# Patient Record
Sex: Female | Born: 1975 | Race: Black or African American | Hispanic: No | Marital: Single | Smoking: Current some day smoker
Health system: Southern US, Community
[De-identification: ages and names within clinical notes are randomized; demographics above are authoritative.]

## PROBLEM LIST (undated history)

## (undated) DIAGNOSIS — T8859XA Other complications of anesthesia, initial encounter: Secondary | ICD-10-CM

## (undated) DIAGNOSIS — D649 Anemia, unspecified: Secondary | ICD-10-CM

## (undated) DIAGNOSIS — R112 Nausea with vomiting, unspecified: Secondary | ICD-10-CM

## (undated) DIAGNOSIS — G43909 Migraine, unspecified, not intractable, without status migrainosus: Secondary | ICD-10-CM

## (undated) DIAGNOSIS — F329 Major depressive disorder, single episode, unspecified: Secondary | ICD-10-CM

## (undated) DIAGNOSIS — J45998 Other asthma: Secondary | ICD-10-CM

## (undated) DIAGNOSIS — Z9889 Other specified postprocedural states: Secondary | ICD-10-CM

## (undated) DIAGNOSIS — F32A Depression, unspecified: Secondary | ICD-10-CM

## (undated) DIAGNOSIS — J42 Unspecified chronic bronchitis: Secondary | ICD-10-CM

## (undated) DIAGNOSIS — G4730 Sleep apnea, unspecified: Secondary | ICD-10-CM

## (undated) DIAGNOSIS — T4145XA Adverse effect of unspecified anesthetic, initial encounter: Secondary | ICD-10-CM

## (undated) DIAGNOSIS — C50912 Malignant neoplasm of unspecified site of left female breast: Secondary | ICD-10-CM

## (undated) DIAGNOSIS — M1990 Unspecified osteoarthritis, unspecified site: Secondary | ICD-10-CM

## (undated) DIAGNOSIS — C50911 Malignant neoplasm of unspecified site of right female breast: Secondary | ICD-10-CM

## (undated) DIAGNOSIS — F419 Anxiety disorder, unspecified: Secondary | ICD-10-CM

## (undated) HISTORY — PX: ABDOMINAL HYSTERECTOMY: SHX81

## (undated) HISTORY — PX: TONSILLECTOMY: SUR1361

## (undated) HISTORY — PX: BREAST BIOPSY: SHX20

## (undated) HISTORY — PX: FRACTURE SURGERY: SHX138

## (undated) HISTORY — PX: TUBAL LIGATION: SHX77

## (undated) HISTORY — PX: MASTECTOMY: SHX3

---

## 2004-10-15 ENCOUNTER — Ambulatory Visit (HOSPITAL_COMMUNITY): Admission: RE | Admit: 2004-10-15 | Discharge: 2004-10-15 | Payer: Self-pay | Admitting: Cardiology

## 2004-12-11 ENCOUNTER — Ambulatory Visit (HOSPITAL_BASED_OUTPATIENT_CLINIC_OR_DEPARTMENT_OTHER): Admission: RE | Admit: 2004-12-11 | Discharge: 2004-12-11 | Payer: Self-pay | Admitting: Cardiology

## 2004-12-11 ENCOUNTER — Ambulatory Visit: Payer: Self-pay | Admitting: Internal Medicine

## 2005-03-21 ENCOUNTER — Encounter: Payer: Self-pay | Admitting: Pulmonary Disease

## 2005-06-19 ENCOUNTER — Other Ambulatory Visit: Admission: RE | Admit: 2005-06-19 | Discharge: 2005-06-19 | Payer: Self-pay | Admitting: Obstetrics and Gynecology

## 2006-01-16 ENCOUNTER — Emergency Department (HOSPITAL_COMMUNITY): Admission: EM | Admit: 2006-01-16 | Discharge: 2006-01-16 | Payer: Self-pay | Admitting: Emergency Medicine

## 2007-03-13 ENCOUNTER — Ambulatory Visit (HOSPITAL_COMMUNITY): Admission: RE | Admit: 2007-03-13 | Discharge: 2007-03-13 | Payer: Self-pay | Admitting: Obstetrics and Gynecology

## 2007-03-13 HISTORY — PX: NOVASURE ABLATION: SHX5394

## 2007-12-09 ENCOUNTER — Encounter: Admission: RE | Admit: 2007-12-09 | Discharge: 2007-12-09 | Payer: Self-pay | Admitting: Cardiology

## 2008-11-28 ENCOUNTER — Encounter: Admission: RE | Admit: 2008-11-28 | Discharge: 2008-11-28 | Payer: Self-pay | Admitting: Obstetrics and Gynecology

## 2009-07-20 ENCOUNTER — Ambulatory Visit: Payer: Self-pay | Admitting: Pulmonary Disease

## 2009-07-20 DIAGNOSIS — G4733 Obstructive sleep apnea (adult) (pediatric): Secondary | ICD-10-CM

## 2009-07-20 DIAGNOSIS — J309 Allergic rhinitis, unspecified: Secondary | ICD-10-CM | POA: Insufficient documentation

## 2009-07-20 DIAGNOSIS — R0602 Shortness of breath: Secondary | ICD-10-CM

## 2009-07-20 DIAGNOSIS — J45909 Unspecified asthma, uncomplicated: Secondary | ICD-10-CM | POA: Insufficient documentation

## 2010-01-12 ENCOUNTER — Encounter: Admission: RE | Admit: 2010-01-12 | Discharge: 2010-01-12 | Payer: Self-pay | Admitting: Obstetrics and Gynecology

## 2010-12-02 ENCOUNTER — Encounter: Payer: Self-pay | Admitting: Cardiology

## 2010-12-19 ENCOUNTER — Other Ambulatory Visit: Payer: Self-pay | Admitting: Obstetrics and Gynecology

## 2010-12-19 DIAGNOSIS — N63 Unspecified lump in unspecified breast: Secondary | ICD-10-CM

## 2011-01-11 ENCOUNTER — Other Ambulatory Visit: Payer: Self-pay | Admitting: Obstetrics and Gynecology

## 2011-01-11 DIAGNOSIS — Z1231 Encounter for screening mammogram for malignant neoplasm of breast: Secondary | ICD-10-CM

## 2011-01-24 ENCOUNTER — Ambulatory Visit
Admission: RE | Admit: 2011-01-24 | Discharge: 2011-01-24 | Disposition: A | Discharge status: Home / Self Care | Payer: Medicare (Managed Care) | Source: Ambulatory Visit | Attending: Obstetrics and Gynecology | Admitting: Obstetrics and Gynecology

## 2011-01-24 ENCOUNTER — Other Ambulatory Visit: Payer: Self-pay | Admitting: Obstetrics and Gynecology

## 2011-01-24 DIAGNOSIS — Z1231 Encounter for screening mammogram for malignant neoplasm of breast: Secondary | ICD-10-CM

## 2011-01-24 DIAGNOSIS — N6452 Nipple discharge: Secondary | ICD-10-CM

## 2011-01-31 ENCOUNTER — Ambulatory Visit
Admission: RE | Admit: 2011-01-31 | Discharge: 2011-01-31 | Disposition: A | Discharge status: Home / Self Care | Payer: Medicare (Managed Care) | Source: Ambulatory Visit | Attending: Obstetrics and Gynecology | Admitting: Obstetrics and Gynecology

## 2011-01-31 DIAGNOSIS — N6452 Nipple discharge: Secondary | ICD-10-CM

## 2011-03-29 NOTE — H&P (Signed)
NAMEJANELLY, [name]              ACCOUNT NO.:  [id number]   MEDICAL RECORD NO.:  [id number]          PATIENT TYPE:  AMB   LOCATION:  SDC                           FACILITY:  [facility]   PHYSICIAN:  Sherron Monday, MD        DATE OF BIRTH:  [birth year]   DATE OF ADMISSION:  03/13/2007  DATE OF DISCHARGE:                              HISTORY & PHYSICAL   ADMITTING DIAGNOSIS:  Menorrhagia, for NovaSure endometrial ablation.   HISTORY OF PRESENT ILLNESS:  A 35 year old G1, P1, who has menorrhagia  and declines oral contraceptive pills for management and desires  NovaSure ablation.  An endometrial biopsy was performed, which was  negative, and then a sonohystogram revealed no polyps.   PAST MEDICAL HISTORY:  Significant for asthma and allergies.   PAST SURGICAL HISTORY:  Significant for bilateral tubal ligation, as  well as a breast lumpectomy.   PAST OB-GYN HISTORY:  She is a G1, P1, with G1 being a term delivery of  a 6-pound, 5-ounce infant female who is 13 years old.  She has a history  of an abnormal Pap smear.  They have been normal, since her last Pap was  performed on the 30th of January and was within normal limits.  She has  a history of Chlamydia as a sexually transmitted disease, treated and  the test of cure was negative.  She states that her periods are mostly  regular; however, sometimes she skips and they are very heavy.  She has  a tubal ligation for contraception.  She is sexually active and  monogamous and has no dyspareunia; occasionally spots other times in her  cycle but no postcoital bleeding.   MEDICATIONS:  1. Trazodone.  2. Albuterol.  3. Nasonex.  4. Astelin.   ALLERGIES:  NO KNOWN DRUG ALLERGIES.   SOCIAL HISTORY:  She smokes approximately a pack a day.  No alcohol or  drug use.  She is single and a homemaker.   FAMILY HISTORY:  Significant for an aunt with breast cancer and  widespread diabetes on her paternal side.   PHYSICAL EXAMINATION:  VITAL SIGNS:   Afebrile, vital signs stable.  GENERAL:  No apparent distress.  CARDIOVASCULAR:  Regular rate and rhythm.  LUNGS:  Clear to auscultation bilaterally.  ABDOMEN:  Soft, nontender, nondistended and positive bowel sounds.  NECK:  Without lymphadenopathy.  Thyroid within normal limits.  HEENT:  Mucous membranes are moist.  BREAST:  C cup, no masses noted, nontender, no distortion.  PELVIC:  Reveals normal external female genitalia, normal BUS, good  support.  Cervix and vagina without lesion.  No cervical motion  tenderness.  Uterus within normal limits.  Bilateral adnexa.  No masses  and nontender.   ASSESSMENT/PLAN:  A 35 year old African-American G1, P1 with menorrhagia  who desires definitive treatment and has opted for a NovaSure  endometrial ablation.  She will present to the hospital and undergo the  ablation on the second of May.      Sherron Monday, MD  Electronically Signed     JB/MEDQ  D:  03/12/2007  T:  03/12/2007  Job:  [id number]

## 2011-03-29 NOTE — Procedures (Signed)
NAME:  [name], [name]              ACCOUNT NO.:  [id number]   MEDICAL RECORD NO.:  [id number]          PATIENT TYPE:  OUT   LOCATION:  SLEEP CENTER                 FACILITY:  [facility]   PHYSICIAN:  Clinton D. Maple Hudson, M.D. DATE OF BIRTH:  1976-05-14   DATE OF STUDY:  12/11/2004                              NOCTURNAL POLYSOMNOGRAM   REFERRING PHYSICIAN:  Donia Guiles, MD   INDICATIONS FOR STUDY:  Insomnia with sleep apnea. Epworth sleepiness score  11/24, BMI 24, weight 152 pounds.   SLEEP ARCHITECTURE:  Total sleep time 379 minutes with sleep efficiency of  82%. Stage I was 5%, stage II was 43%, stages III and IV were 16%, REM was  6% of total sleep time. Latency to sleep onset 66 minutes. Latency to REM 65  minutes. Awake after sleep onset 16 minutes. Arousal index 11. She took  trazodone at lights out.   RESPIRATORY DATA:  RDI of 2.5 per hour which is within normal limits. This  included 4 obstructive apneas, 3 central apneas, 1 mixed apnea, and 8  hypopneas. Events were not positional. REM RDI was 6.1   OXYGEN DATA:  Mild to moderate snoring with oxygen desaturation briefly to a  nadir of 87%.  Mean oxygen saturation was 97% to 98% on room air through the  study.   CARDIAC DATA:  Normal sinus rhythm.   MOVEMENT/PARASOMNIA:  A total of 97 limb jerks were recorded of 12 were  associated with arousal or awakening for a periodic limb movement with  arousal index of 1.9 per hour which is unremarkable. Bathroom times 1.   IMPRESSION/RECOMMENDATIONS:  1.  Occasional sleep disordered breathing events with a frequency (RDI) of      2.5 per hour, which is within normal limits, and diagnostic of a sleep      apnea syndrome. CPAP trial is indicated.  2.  Relatively long latency to sleep onset with sleep architecture      subsequently remarkable mainly for increased REM. This may reflect      rebound if REM has been suppressed on previous nights. She took      trazodone to assist with  sleep.  3.  Suggest managing primary complaint as insomnia with attention to good      sleep hygiene.     CDY/MEDQ  D:  12/16/2004 09:51:30  T:  12/16/2004 21:02:15  Job:  [id number]

## 2011-03-29 NOTE — Op Note (Signed)
NAMEJAKITA, [name]              ACCOUNT NO.:  [id number]   MEDICAL RECORD NO.:  [id number]          PATIENT TYPE:  AMB   LOCATION:  SDC                           FACILITY:  [facility]   PHYSICIAN:  Sherron Monday, MD        DATE OF BIRTH:  1976/03/31   DATE OF PROCEDURE:  03/13/2007  DATE OF DISCHARGE:                               OPERATIVE REPORT   PREOPERATIVE DIAGNOSIS:  Menorrhagia.   POSTOPERATIVE DIAGNOSIS:  Menorrhagia.   OPERATION PERFORMED:  Novasure endometrial ablation.   SURGEON:  Sherron Monday, MD   ANESTHESIA:  General, local.   SPECIMENS:  None.   ESTIMATED BLOOD LOSS:  Minimal.   IV FLUIDS:  600 mL.   URINE OUTPUT:  In and out cath at start of procedure.   COMPLICATIONS:  None.   FINDINGS:  Uterus sounded to 8.5 cm, cervical length 3 cm, cavity length  of 5.5 cm, cavity width measured at 3 cm, power at 91 W, time 70  seconds.   DISPOSITION:  To post anesthesia care unit in stable condition.   DESCRIPTION OF PROCEDURE:  After informed consent was reviewed with the  patient including risks, benefits, alternatives to surgical procedure,  she was transported in stable condition to the operating room, placed on  the table in supine position.  General anesthesia was induced, found to  be adequate.  She was then placed in yellow fin stirrups, prepped and  draped in the normal sterile fashion.  Her bladder was drained with a  red rubber catheter.  Using an open sided speculum, her cervix was  easily visualized, grasped with a single toothed tenaculum and 10 mL of  local anesthetic was administered without difficulty.  The uterus was  sounded to 8.5 cm, was then dilated to accommodate the Novasure  endometrial ablation device.  The cervical length was found to be 3 cm.  After placement of the device, the cavity was calculated to be 3 cm.  A  cavity assessment was performed, found to be intact.  The ablation was  then performed for approximately 70 seconds with a power  of 91 W.  The  patient tolerated the procedure well.  The device was then removed.  The  single toothed tenaculum was removed.  Hemostasis was  assured.  The speculum was removed.  The patient was returned to supine  position.  She tolerated the procedure well.  Sponge, lap, and needle  counts were correct x2 at the end of the procedure.  The patient was  taken in stable condition to PACU.      Sherron Monday, MD  Electronically Signed     JB/MEDQ  D:  03/13/2007  T:  03/13/2007  Job:  [id number]

## 2011-06-14 ENCOUNTER — Other Ambulatory Visit: Payer: Self-pay | Admitting: Obstetrics and Gynecology

## 2011-06-14 DIAGNOSIS — N63 Unspecified lump in unspecified breast: Secondary | ICD-10-CM

## 2011-06-20 ENCOUNTER — Ambulatory Visit
Admission: RE | Admit: 2011-06-20 | Discharge: 2011-06-20 | Disposition: A | Discharge status: Home / Self Care | Payer: Medicare Other | Source: Ambulatory Visit | Attending: Obstetrics and Gynecology | Admitting: Obstetrics and Gynecology

## 2011-06-20 ENCOUNTER — Other Ambulatory Visit: Payer: Self-pay | Admitting: Obstetrics and Gynecology

## 2011-06-20 DIAGNOSIS — N63 Unspecified lump in unspecified breast: Secondary | ICD-10-CM

## 2011-07-23 ENCOUNTER — Emergency Department (HOSPITAL_COMMUNITY)
Admission: EM | Admit: 2011-07-23 | Discharge: 2011-07-24 | Discharge status: Against Medical Advice | Payer: Medicare Other | Attending: Emergency Medicine | Admitting: Emergency Medicine

## 2011-07-23 DIAGNOSIS — R197 Diarrhea, unspecified: Secondary | ICD-10-CM | POA: Insufficient documentation

## 2011-07-23 DIAGNOSIS — R112 Nausea with vomiting, unspecified: Secondary | ICD-10-CM | POA: Insufficient documentation

## 2011-07-23 DIAGNOSIS — R51 Headache: Secondary | ICD-10-CM | POA: Insufficient documentation

## 2011-11-22 ENCOUNTER — Emergency Department (HOSPITAL_COMMUNITY)
Admission: EM | Admit: 2011-11-22 | Discharge: 2011-11-23 | Disposition: A | Discharge status: Home / Self Care | Payer: Self-pay | Attending: Emergency Medicine | Admitting: Emergency Medicine

## 2011-11-22 ENCOUNTER — Encounter (HOSPITAL_COMMUNITY): Payer: Self-pay

## 2011-11-22 DIAGNOSIS — F17200 Nicotine dependence, unspecified, uncomplicated: Secondary | ICD-10-CM | POA: Insufficient documentation

## 2011-11-22 DIAGNOSIS — L0231 Cutaneous abscess of buttock: Secondary | ICD-10-CM | POA: Insufficient documentation

## 2011-11-22 MED ORDER — LIDOCAINE HCL (PF) 1 % IJ SOLN
30.0000 mL | Freq: Once | INTRAMUSCULAR | Status: AC
  Administered 2011-11-23: 30 mL
  Filled 2011-11-22: qty 30

## 2011-11-22 NOTE — ED Notes (Signed)
Pt presents w/ abscess in gluteal crease x 2 months.

## 2011-11-23 MED ORDER — DOXYCYCLINE HYCLATE 100 MG PO CAPS: 100.0000 mg | ORAL_CAPSULE | Freq: Two times a day (BID) | ORAL | Status: AC

## 2011-11-23 MED ORDER — HYDROCODONE-ACETAMINOPHEN 5-325 MG PO TABS: 1.0000 | ORAL_TABLET | ORAL | Status: AC | PRN

## 2011-11-23 NOTE — ED Provider Notes (Signed)
Medical screening examination/treatment/procedure(s) were performed by non-physician practitioner and as supervising physician I was immediately available for consultation/collaboration.  Tru Leopard K Cross Jorge-Rasch, MD 11/23/11 0644 

## 2011-11-23 NOTE — ED Provider Notes (Signed)
History     CSN: [id number]  Arrival date & time 11/22/11  2157   First MD Initiated Contact with Patient 11/22/11 2235      Chief Complaint  Patient presents with  . Abscess    LT buttock.  states the size of 2, 50cent pieces.     (Consider location/radiation/quality/duration/timing/severity/associated sxs/prior treatment) Patient is a 36 y.o. female presenting with abscess. The history is provided by the patient.  Abscess  This is a new problem. The current episode started more than one week ago. The problem occurs continuously. The problem has been gradually worsening. Affected Location: left buttock. The problem is severe. The abscess is characterized by redness, painfulness and swelling. The abscess first occurred at home. Pertinent negatives include no fever, no diarrhea and no vomiting. She has received no recent medical care.    Past Medical History  Diagnosis Date  . Asthma     Past Surgical History  Procedure Date  . Tubal ligation   . Breast lumpectomy     RT side    History reviewed. No pertinent family history.  History  Substance Use Topics  . Smoking status: Current Everyday Smoker -- 0.2 packs/day  . Smokeless tobacco: Not on file  . Alcohol Use: No     Review of Systems  Constitutional: Negative for fever and chills.  Gastrointestinal: Negative for nausea, vomiting, abdominal pain and diarrhea.  Musculoskeletal: Negative for back pain and gait problem.  Skin: Positive for color change.       Positive abscess  Neurological: Negative for weakness, numbness and headaches.    Allergies  Review of patient's allergies indicates no known allergies.  Home Medications   Current Outpatient Rx  Name Route Sig Dispense Refill  . DULOXETINE HCL 30 MG PO CPEP Oral Take 90 mg by mouth daily.    . TRAZODONE HCL 100 MG PO TABS Oral Take 300 mg by mouth at bedtime.      BP 128/87  Pulse 103  Temp(Src) 98.8 F (37.1 C) (Oral)  Resp 16  Ht 5' 6.5"  (1.689 m)  SpO2 100%  LMP 11/11/2011  Physical Exam  Nursing note and vitals reviewed. Constitutional: She is oriented to person, place, and time. She appears well-developed and well-nourished. No distress.  HENT:  Head: Normocephalic and atraumatic.  Right Ear: External ear normal.  Left Ear: External ear normal.  Nose: Nose normal.  Eyes: EOM are normal. Pupils are equal, round, and reactive to light.  Neck: Normal range of motion. Neck supple.  Cardiovascular: Normal rate, regular rhythm and intact distal pulses.   Pulmonary/Chest: Effort normal and breath sounds normal. No respiratory distress.  Abdominal: Soft. She exhibits no distension. There is no tenderness.  Musculoskeletal: Normal range of motion. She exhibits no edema and no tenderness.  Neurological: She is alert and oriented to person, place, and time. Coordination normal.  Skin: Skin is warm and dry. No rash noted.       2cm area of erythema, induration without central fluctuance to medial left buttock. Bedside ultrasound demonstrates subcutaneous fluid collection.  Psychiatric: Her behavior is normal.    ED Course  Procedures (including critical care time)  Labs Reviewed - No data to display No results found. INCISION AND DRAINAGE Performed by: Lorenz Coaster Consent: Verbal consent obtained. Risks and benefits: risks, benefits and alternatives were discussed Type: abscess  Body area: left buttock  Anesthesia: local infiltration  Local anesthetic: lidocaine 1% without epinephrine  Anesthetic total: 1 ml  Complexity: complex Blunt dissection to break up loculations  Drainage: purulent  Drainage amount: copious  Packing material: 1/4 in iodoform gauze  Patient tolerance: Patient tolerated the procedure well with no immediate complications.     Dx 1: abscess and cellulitis of left buttock   MDM  Abscess, drained, will put on abx for mild surrounding cellulitis        Shaaron Adler, PA 11/23/11 0040

## 2013-01-11 ENCOUNTER — Encounter (HOSPITAL_COMMUNITY): Payer: Self-pay | Admitting: Pharmacist

## 2013-01-13 ENCOUNTER — Encounter (HOSPITAL_COMMUNITY)
Admission: RE | Admit: 2013-01-13 | Discharge: 2013-01-13 | Disposition: A | Discharge status: Home / Self Care | Payer: Medicare Other | Source: Ambulatory Visit | Attending: Obstetrics and Gynecology | Admitting: Obstetrics and Gynecology

## 2013-01-13 ENCOUNTER — Encounter (HOSPITAL_COMMUNITY): Payer: Self-pay

## 2013-01-13 HISTORY — DX: Anemia, unspecified: D64.9

## 2013-01-13 HISTORY — DX: Sleep apnea, unspecified: G47.30

## 2013-01-13 HISTORY — DX: Depression, unspecified: F32.A

## 2013-01-13 HISTORY — DX: Major depressive disorder, single episode, unspecified: F32.9

## 2013-01-13 LAB — CBC
HCT: 37.4 % (ref 36.0–46.0)
Hemoglobin: 12.6 g/dL (ref 12.0–15.0)
MCH: 28.6 pg (ref 26.0–34.0)
MCHC: 33.7 g/dL (ref 30.0–36.0)
RBC: 4.41 MIL/uL (ref 3.87–5.11)
RDW: 13.1 % (ref 11.5–15.5)
WBC: 2.9 10*3/uL — ABNORMAL LOW (ref 4.0–10.5)

## 2013-01-13 LAB — SURGICAL PCR SCREEN
MRSA, PCR: NEGATIVE
Staphylococcus aureus: POSITIVE — AB

## 2013-01-13 NOTE — Patient Instructions (Signed)
Your procedure is scheduled on:01/21/13  Enter through the Main Entrance at :6am Pick up desk phone and dial [phone] and inform us of your arrival.  Please call [phone] if you have any problems the morning of surgery.  Remember: Do not eat or drink after midnight:WED   Take these meds the morning of surgery with a sip of water:Cymbalta  DO NOT wear jewelry, eye make-up, lipstick,body lotion, or dark fingernail polish. Do not shave for 48 hours prior to surgery.  If you are to be admitted after surgery, leave suitcase in car until your room has been assigned. Patients discharged on the day of surgery will not be allowed to drive home.

## 2013-01-20 ENCOUNTER — Encounter (HOSPITAL_COMMUNITY): Payer: Self-pay | Admitting: Obstetrics and Gynecology

## 2013-01-20 DIAGNOSIS — Z9889 Other specified postprocedural states: Secondary | ICD-10-CM

## 2013-01-20 NOTE — H&P (Signed)
[name] is an 37 y.o. female. With menorrhagia despite endometrial ablation.  States has 14 day monthly menses.  On [place] likely adenomyosis noted.  Pt endorses uncomfortable,crampy menses. D/w pt r/b/a of surgery - LAVH and B partial salpingectomy.  Pt voices understanding wishes to proceed.    Pertinent Gynecological History: Menses: flow is heavy, lasting 14 days, also crampy Bleeding: dysfunctional uterine bleeding Contraception: tubal ligation DES exposure: unknown Blood transfusions: none Sexually transmitted diseases: past history: Chlamydia Previous GYN Procedures: Novasure endometrial ablation  Last mammogram: normal Date: 2013 Last pap: normal Date: 10/2012 OB History: G1, P1  Menstrual History:  No LMP recorded.    Past Medical History  Diagnosis Date  . Depression   . Shortness of breath     on exertion  . Sleep apnea     mild per pt- no CPAP  . Headache     migraines  . Anemia   . Asthma     no inhaler  . Menorrhagia with regular cycle 01/20/2013  . S/P endometrial ablation 01/20/2013    Past Surgical History  Procedure Laterality Date  . Tubal ligation    . Breast lumpectomy      RT side  Novasure 2008  Family Hx: Breast Cancer, DM 2, HTN  Social History:  reports that she has been smoking Cigarettes.  She has been smoking about 0.25 packs per day. She does not have any smokeless tobacco history on file. She reports that  drinks alcohol. She reports that she does not use illicit drugs.single  Allergies: No Known Allergies  Meds Ibuprofen, Percocet, Norco, biotin, cymbalta, MVI, trazadone  No prescriptions prior to admission    Review of Systems  Constitutional: Negative.   HENT: Negative.   Eyes: Negative.   Respiratory: Negative.   Cardiovascular: Negative.   Gastrointestinal: Negative.   Genitourinary: Negative.   Musculoskeletal: Negative.   Skin: Negative.   Neurological: Negative.   Psychiatric/Behavioral: Negative.     There  were no vitals taken for this visit. Physical Exam  Constitutional: She is oriented to person, place, and time. She appears well-developed and well-nourished.  HENT:  Head: Normocephalic and atraumatic.  Neck: Normal range of motion. Neck supple.  Cardiovascular: Normal rate and regular rhythm.   Respiratory: Effort normal and breath sounds normal. No respiratory distress.  GI: Soft. Bowel sounds are normal. There is no tenderness.  Genitourinary: Vagina normal.  Uterus ULN  Musculoskeletal: Normal range of motion.  Neurological: She is alert and oriented to person, place, and time.  Skin: Skin is warm and dry.  Psychiatric: She has a normal mood and affect. Her behavior is normal.    No results found for this or any previous visit (from the past 24 hour(s)).  No results found.  US shows likely adenomyosis  Assessment/Plan: 37yo G1P1 with menorrhagia, despite endometrial ablation and likely adenomyosis for LAVH, B partial salpingectomy.  Have d/w pt r/b/a, wishes to proceed.  Ancef for prophylaxis.    [name],[name] 01/20/2013, 9:11 PM

## 2013-01-21 ENCOUNTER — Encounter (HOSPITAL_COMMUNITY): Payer: Self-pay | Admitting: *Deleted

## 2013-01-21 ENCOUNTER — Encounter (HOSPITAL_COMMUNITY): Payer: Self-pay | Admitting: Anesthesiology

## 2013-01-21 ENCOUNTER — Ambulatory Visit (HOSPITAL_COMMUNITY): Payer: Medicare Other | Admitting: Anesthesiology

## 2013-01-21 ENCOUNTER — Encounter (HOSPITAL_COMMUNITY)
Admission: RE | Disposition: A | Discharge status: Home / Self Care | Payer: Self-pay | Source: Ambulatory Visit | Attending: Obstetrics and Gynecology

## 2013-01-21 ENCOUNTER — Observation Stay (HOSPITAL_COMMUNITY)
Admission: RE | Admit: 2013-01-21 | Discharge: 2013-01-22 | Disposition: A | Discharge status: Home / Self Care | Payer: Medicare Other | Source: Ambulatory Visit | Attending: Obstetrics and Gynecology | Admitting: Obstetrics and Gynecology

## 2013-01-21 DIAGNOSIS — N920 Excessive and frequent menstruation with regular cycle: Principal | ICD-10-CM | POA: Diagnosis present

## 2013-01-21 DIAGNOSIS — N8000 Endometriosis of the uterus, unspecified: Secondary | ICD-10-CM | POA: Insufficient documentation

## 2013-01-21 DIAGNOSIS — N938 Other specified abnormal uterine and vaginal bleeding: Secondary | ICD-10-CM | POA: Insufficient documentation

## 2013-01-21 DIAGNOSIS — N949 Unspecified condition associated with female genital organs and menstrual cycle: Secondary | ICD-10-CM | POA: Insufficient documentation

## 2013-01-21 DIAGNOSIS — D251 Intramural leiomyoma of uterus: Secondary | ICD-10-CM | POA: Insufficient documentation

## 2013-01-21 DIAGNOSIS — Z90710 Acquired absence of both cervix and uterus: Secondary | ICD-10-CM

## 2013-01-21 DIAGNOSIS — Z9889 Other specified postprocedural states: Secondary | ICD-10-CM

## 2013-01-21 DIAGNOSIS — Z23 Encounter for immunization: Secondary | ICD-10-CM | POA: Insufficient documentation

## 2013-01-21 HISTORY — PX: BILATERAL SALPINGECTOMY: SHX5743

## 2013-01-21 HISTORY — PX: LAPAROSCOPIC ASSISTED VAGINAL HYSTERECTOMY: SHX5398

## 2013-01-21 SURGERY — HYSTERECTOMY, VAGINAL, LAPAROSCOPY-ASSISTED
Anesthesia: General | Wound class: Clean Contaminated

## 2013-01-21 MED ORDER — PROPOFOL 10 MG/ML IV EMUL
INTRAVENOUS | Status: DC | PRN
  Administered 2013-01-21: 180 mg via INTRAVENOUS

## 2013-01-21 MED ORDER — NEOSTIGMINE METHYLSULFATE 1 MG/ML IJ SOLN
INTRAMUSCULAR | Status: DC | PRN
  Administered 2013-01-21: 1 mg via INTRAVENOUS

## 2013-01-21 MED ORDER — ONDANSETRON HCL 4 MG PO TABS: 4.0000 mg | ORAL_TABLET | Freq: Four times a day (QID) | ORAL | Status: DC | PRN

## 2013-01-21 MED ORDER — GLYCOPYRROLATE 0.2 MG/ML IJ SOLN
INTRAMUSCULAR | Status: AC
  Filled 2013-01-21: qty 1

## 2013-01-21 MED ORDER — LIDOCAINE HCL (CARDIAC) 20 MG/ML IV SOLN
INTRAVENOUS | Status: AC
  Filled 2013-01-21: qty 5

## 2013-01-21 MED ORDER — HYDROXYZINE HCL 50 MG PO TABS
50.0000 mg | ORAL_TABLET | Freq: Three times a day (TID) | ORAL | Status: DC
  Administered 2013-01-21 – 2013-01-22 (×3): 50 mg via ORAL
  Filled 2013-01-21 (×6): qty 1

## 2013-01-21 MED ORDER — SODIUM CHLORIDE 0.9 % IJ SOLN: 9.0000 mL | INTRAMUSCULAR | Status: DC | PRN

## 2013-01-21 MED ORDER — MIDAZOLAM HCL 5 MG/5ML IJ SOLN
INTRAMUSCULAR | Status: DC | PRN
  Administered 2013-01-21: 2 mg via INTRAVENOUS

## 2013-01-21 MED ORDER — MORPHINE SULFATE 4 MG/ML IJ SOLN
1.0000 mg | INTRAMUSCULAR | Status: DC | PRN
  Administered 2013-01-21 (×5): 2 mg via INTRAVENOUS

## 2013-01-21 MED ORDER — NALOXONE HCL 0.4 MG/ML IJ SOLN: 0.4000 mg | INTRAMUSCULAR | Status: DC | PRN

## 2013-01-21 MED ORDER — FENTANYL CITRATE 0.05 MG/ML IJ SOLN
INTRAMUSCULAR | Status: AC
  Filled 2013-01-21: qty 5

## 2013-01-21 MED ORDER — BUPIVACAINE HCL (PF) 0.25 % IJ SOLN
INTRAMUSCULAR | Status: AC
  Filled 2013-01-21: qty 30

## 2013-01-21 MED ORDER — LIDOCAINE HCL (CARDIAC) 20 MG/ML IV SOLN
INTRAVENOUS | Status: DC | PRN
  Administered 2013-01-21: 50 mg via INTRAVENOUS

## 2013-01-21 MED ORDER — GUAIFENESIN 100 MG/5ML PO SOLN: 15.0000 mL | ORAL | Status: DC | PRN

## 2013-01-21 MED ORDER — CEFAZOLIN SODIUM-DEXTROSE 2-3 GM-% IV SOLR
2.0000 g | INTRAVENOUS | Status: AC
  Administered 2013-01-21: 2 g via INTRAVENOUS

## 2013-01-21 MED ORDER — ALUM & MAG HYDROXIDE-SIMETH 200-200-20 MG/5ML PO SUSP: 30.0000 mL | ORAL | Status: DC | PRN

## 2013-01-21 MED ORDER — ONDANSETRON HCL 4 MG/2ML IJ SOLN: 4.0000 mg | Freq: Four times a day (QID) | INTRAMUSCULAR | Status: DC | PRN

## 2013-01-21 MED ORDER — MORPHINE SULFATE 4 MG/ML IJ SOLN
INTRAMUSCULAR | Status: AC
  Filled 2013-01-21: qty 1

## 2013-01-21 MED ORDER — LACTATED RINGERS IR SOLN
Status: DC | PRN
  Administered 2013-01-21: 3000 mL

## 2013-01-21 MED ORDER — ONDANSETRON HCL 4 MG/2ML IJ SOLN
INTRAMUSCULAR | Status: DC | PRN
  Administered 2013-01-21: 4 mg via INTRAVENOUS

## 2013-01-21 MED ORDER — FENTANYL CITRATE 0.05 MG/ML IJ SOLN
INTRAMUSCULAR | Status: DC | PRN
  Administered 2013-01-21 (×2): 100 ug via INTRAVENOUS
  Administered 2013-01-21: 50 ug via INTRAVENOUS
  Administered 2013-01-21: 100 ug via INTRAVENOUS

## 2013-01-21 MED ORDER — DEXAMETHASONE SODIUM PHOSPHATE 10 MG/ML IJ SOLN
INTRAMUSCULAR | Status: DC | PRN
  Administered 2013-01-21: 10 mg via INTRAVENOUS

## 2013-01-21 MED ORDER — NEOSTIGMINE METHYLSULFATE 1 MG/ML IJ SOLN
INTRAMUSCULAR | Status: AC
  Filled 2013-01-21: qty 1

## 2013-01-21 MED ORDER — OXYCODONE-ACETAMINOPHEN 5-325 MG PO TABS
1.0000 | ORAL_TABLET | ORAL | Status: DC | PRN
Start: 2013-01-21 — End: 2013-01-22
  Administered 2013-01-22 (×2): 2 via ORAL
  Filled 2013-01-21 (×2): qty 2

## 2013-01-21 MED ORDER — DIPHENHYDRAMINE HCL 12.5 MG/5ML PO ELIX: 12.5000 mg | ORAL_SOLUTION | Freq: Four times a day (QID) | ORAL | Status: DC | PRN

## 2013-01-21 MED ORDER — CEFAZOLIN SODIUM-DEXTROSE 2-3 GM-% IV SOLR
INTRAVENOUS | Status: AC
  Filled 2013-01-21: qty 50

## 2013-01-21 MED ORDER — GLYCOPYRROLATE 0.2 MG/ML IJ SOLN
INTRAMUSCULAR | Status: DC | PRN
  Administered 2013-01-21: .2 mg via INTRAVENOUS

## 2013-01-21 MED ORDER — MIDAZOLAM HCL 2 MG/2ML IJ SOLN
INTRAMUSCULAR | Status: AC
  Filled 2013-01-21: qty 2

## 2013-01-21 MED ORDER — 0.9 % SODIUM CHLORIDE (POUR BTL) OPTIME
TOPICAL | Status: DC | PRN
  Administered 2013-01-21 (×2): 1000 mL

## 2013-01-21 MED ORDER — MENTHOL 3 MG MT LOZG
1.0000 | LOZENGE | OROMUCOSAL | Status: DC | PRN
  Administered 2013-01-22: 3 mg via ORAL
  Filled 2013-01-21: qty 9

## 2013-01-21 MED ORDER — IBUPROFEN 800 MG PO TABS
800.0000 mg | ORAL_TABLET | Freq: Three times a day (TID) | ORAL | Status: DC | PRN
  Administered 2013-01-22: 800 mg via ORAL
  Filled 2013-01-21: qty 1

## 2013-01-21 MED ORDER — LACTATED RINGERS IV SOLN
INTRAVENOUS | Status: DC
  Administered 2013-01-21 (×3): via INTRAVENOUS

## 2013-01-21 MED ORDER — ONDANSETRON HCL 4 MG/2ML IJ SOLN
INTRAMUSCULAR | Status: AC
  Filled 2013-01-21: qty 2

## 2013-01-21 MED ORDER — LACTATED RINGERS IV SOLN
INTRAVENOUS | Status: DC
  Administered 2013-01-21 – 2013-01-22 (×3): via INTRAVENOUS

## 2013-01-21 MED ORDER — MORPHINE SULFATE (PF) 1 MG/ML IV SOLN
INTRAVENOUS | Status: DC
  Administered 2013-01-21: 11:00:00 via INTRAVENOUS
  Administered 2013-01-21: 6 mg via INTRAVENOUS
  Administered 2013-01-21 – 2013-01-22 (×2): 6 mL via INTRAVENOUS
  Administered 2013-01-22: 3 mg via INTRAVENOUS
  Administered 2013-01-22: 2 mg via INTRAVENOUS
  Filled 2013-01-21: qty 25

## 2013-01-21 MED ORDER — PROPOFOL 10 MG/ML IV EMUL
INTRAVENOUS | Status: AC
  Filled 2013-01-21: qty 20

## 2013-01-21 MED ORDER — ROCURONIUM BROMIDE 100 MG/10ML IV SOLN
INTRAVENOUS | Status: DC | PRN
  Administered 2013-01-21: 10 mg via INTRAVENOUS
  Administered 2013-01-21: 40 mg via INTRAVENOUS

## 2013-01-21 MED ORDER — ROCURONIUM BROMIDE 50 MG/5ML IV SOLN
INTRAVENOUS | Status: AC
  Filled 2013-01-21: qty 1

## 2013-01-21 MED ORDER — DIPHENHYDRAMINE HCL 50 MG/ML IJ SOLN: 12.5000 mg | Freq: Four times a day (QID) | INTRAMUSCULAR | Status: DC | PRN

## 2013-01-21 MED ORDER — BUPIVACAINE HCL (PF) 0.25 % IJ SOLN
INTRAMUSCULAR | Status: DC | PRN
  Administered 2013-01-21: 10 mL

## 2013-01-21 MED ORDER — TRAZODONE HCL 100 MG PO TABS
200.0000 mg | ORAL_TABLET | Freq: Every day | ORAL | Status: DC
  Administered 2013-01-21: 200 mg via ORAL
  Filled 2013-01-21 (×2): qty 2

## 2013-01-21 MED ORDER — DEXAMETHASONE SODIUM PHOSPHATE 10 MG/ML IJ SOLN
INTRAMUSCULAR | Status: AC
  Filled 2013-01-21: qty 1

## 2013-01-21 MED ORDER — DULOXETINE HCL 20 MG PO CPEP
20.0000 mg | ORAL_CAPSULE | Freq: Two times a day (BID) | ORAL | Status: DC
Start: 2013-01-21 — End: 2013-01-22
  Administered 2013-01-21 – 2013-01-22 (×2): 20 mg via ORAL
  Filled 2013-01-21 (×5): qty 1

## 2013-01-21 MED ORDER — SIMETHICONE 80 MG PO CHEW: 80.0000 mg | CHEWABLE_TABLET | Freq: Four times a day (QID) | ORAL | Status: DC | PRN

## 2013-01-21 MED ORDER — LACTATED RINGERS IV SOLN
INTRAVENOUS | Status: DC
  Administered 2013-01-21: 07:00:00 via INTRAVENOUS

## 2013-01-21 SURGICAL SUPPLY — 31 items
CABLE HIGH FREQUENCY MONO STRZ (ELECTRODE) IMPLANT
CHLORAPREP W/TINT 26ML (MISCELLANEOUS) ×2 IMPLANT
CLOTH BEACON ORANGE TIMEOUT ST (SAFETY) ×2 IMPLANT
CONT PATH 16OZ SNAP LID 3702 (MISCELLANEOUS) ×2 IMPLANT
COVER TABLE BACK 60X90 (DRAPES) ×2 IMPLANT
DECANTER SPIKE VIAL GLASS SM (MISCELLANEOUS) IMPLANT
ELECT REM PT RETURN 9FT ADLT (ELECTROSURGICAL)
ELECTRODE REM PT RTRN 9FT ADLT (ELECTROSURGICAL) IMPLANT
EVACUATOR PREFILTER SMOKE (MISCELLANEOUS) ×2 IMPLANT
GLOVE BIO SURGEON STRL SZ 6.5 (GLOVE) ×2 IMPLANT
GLOVE BIO SURGEON STRL SZ7 (GLOVE) ×2 IMPLANT
GOWN STRL REIN XL XLG (GOWN DISPOSABLE) ×8 IMPLANT
NEEDLE INSUFFLATION 120MM (ENDOMECHANICALS) ×2 IMPLANT
NS IRRIG 1000ML POUR BTL (IV SOLUTION) ×2 IMPLANT
PACK LAVH (CUSTOM PROCEDURE TRAY) ×2 IMPLANT
PROTECTOR NERVE ULNAR (MISCELLANEOUS) ×2 IMPLANT
SCALPEL HARMONIC ACE (MISCELLANEOUS) ×2 IMPLANT
SET CYSTO W/LG BORE CLAMP LF (SET/KITS/TRAYS/PACK) IMPLANT
SET IRRIG TUBING LAPAROSCOPIC (IRRIGATION / IRRIGATOR) IMPLANT
STRIP CLOSURE SKIN 1/4X3 (GAUZE/BANDAGES/DRESSINGS) IMPLANT
SUT VIC AB 1 CT1 18XBRD ANBCTR (SUTURE) ×2 IMPLANT
SUT VIC AB 1 CT1 8-18 (SUTURE) ×4
SUT VIC AB 2-0 CT1 (SUTURE) ×2 IMPLANT
SUT VICRYL 0 TIES 12 18 (SUTURE) ×2 IMPLANT
SUT VICRYL 0 UR6 27IN ABS (SUTURE) IMPLANT
SUT VICRYL 4-0 PS2 18IN ABS (SUTURE) ×2 IMPLANT
TOWEL OR 17X24 6PK STRL BLUE (TOWEL DISPOSABLE) ×4 IMPLANT
TRAY FOLEY CATH 14FR (SET/KITS/TRAYS/PACK) ×2 IMPLANT
TROCAR XCEL NON-BLD 5MMX100MML (ENDOMECHANICALS) ×6 IMPLANT
WARMER LAPAROSCOPE (MISCELLANEOUS) ×2 IMPLANT
WATER STERILE IRR 1000ML POUR (IV SOLUTION) ×2 IMPLANT

## 2013-01-21 NOTE — Interval H&P Note (Signed)
History and Physical Interval Note:  01/21/2013 7:05 AM  [name]  has presented today for surgery, with the diagnosis of abdnormal uterine bleeding 84696, [phone]  The various methods of treatment have been discussed with the patient and family. After consideration of risks, benefits and other options for treatment, the patient has consented to  Procedure(s) with comments: LAPAROSCOPIC ASSISTED VAGINAL HYSTERECTOMY (N/A) BILATERAL SALPINGECTOMY (N/A) - partial as a surgical intervention .  The patient's history has been reviewed, patient examined, no change in status, stable for surgery.  I have reviewed the patient's chart and labs.  Questions were answered to the patient's satisfaction.     [name],[name]

## 2013-01-21 NOTE — Brief Op Note (Signed)
01/21/2013  9:27 AM  PATIENT:  [name]  37 y.o. female  PRE-OPERATIVE DIAGNOSIS:  Menorrhagia; Adenomyosis; Abnormal Uterine Bleeding 78469, 58700  POST-OPERATIVE DIAGNOSIS:  Menorrhagia; Adenomyosis; Abnormal Uterine Bleeding 62952, 58700  PROCEDURE:  Procedure(s): LAPAROSCOPIC ASSISTED VAGINAL HYSTERECTOMY (N/A) BILATERAL SALPINGECTOMY (N/A)  SURGEON:  Surgeon(s) and Role:    * Sherron Monday, MD - Primary    * Oliver Pila, MD - Assisting   ANESTHESIA:   general  EBL:  Total I/O In: 1700 [I.V.:1700] Out: 360 [Urine:160; Blood:200]  BLOOD ADMINISTERED:none  DRAINS: Urinary Catheter (Foley)   LOCAL MEDICATIONS USED:  MARCAINE     SPECIMEN:  Source of Specimen:  uterus, cervix and B tubes  DISPOSITION OF SPECIMEN:  PATHOLOGY  COUNTS:  YES  TOURNIQUET:  * No tourniquets in log *  DICTATION: .Other Dictation: Dictation Number [id number]  PLAN OF CARE: Admit for overnight observation  PATIENT DISPOSITION:  PACU - hemodynamically stable.   Delay start of Pharmacological VTE agent (>24hrs) due to surgical blood loss or risk of bleeding: yes

## 2013-01-21 NOTE — Anesthesia Postprocedure Evaluation (Signed)
  Anesthesia Post-op Note  Patient: [name]  Procedure(s) Performed: Procedure(s): LAPAROSCOPIC ASSISTED VAGINAL HYSTERECTOMY (N/A) BILATERAL SALPINGECTOMY (N/A)  Patient Location: PACU  Anesthesia Type:General  Level of Consciousness: awake, alert  and oriented  Airway and Oxygen Therapy: Patient Spontanous Breathing  Post-op Pain: mild  Post-op Assessment: Post-op Vital signs reviewed, Patient's Cardiovascular Status Stable, Respiratory Function Stable, Patent Airway, No signs of Nausea or vomiting and Pain level controlled  Post-op Vital Signs: Reviewed and stable  Complications: No apparent anesthesia complications

## 2013-01-21 NOTE — Anesthesia Preprocedure Evaluation (Addendum)
Anesthesia Evaluation  Patient identified by MRN, date of birth, ID band Patient awake    Reviewed: Allergy & Precautions, H&P , NPO status , Patient's Chart, lab work & pertinent test results  Airway Mallampati: II TM Distance: >3 FB Neck ROM: Full    Dental no notable dental hx.    Pulmonary asthma , sleep apnea , Current Smoker,  breath sounds clear to auscultation  Pulmonary exam normal       Cardiovascular negative cardio ROS  Rhythm:Regular Rate:Normal     Neuro/Psych negative neurological ROS  negative psych ROS   GI/Hepatic negative GI ROS, Neg liver ROS,   Endo/Other  negative endocrine ROS  Renal/GU negative Renal ROS  negative genitourinary   Musculoskeletal negative musculoskeletal ROS (+)   Abdominal   Peds negative pediatric ROS (+)  Hematology negative hematology ROS (+)   Anesthesia Other Findings   Reproductive/Obstetrics negative OB ROS                          Anesthesia Physical Anesthesia Plan  ASA: II  Anesthesia Plan: General   Post-op Pain Management:    Induction: Intravenous  Airway Management Planned: Oral ETT  Additional Equipment:   Intra-op Plan:   Post-operative Plan: Extubation in OR  Informed Consent: I have reviewed the patients History and Physical, chart, labs and discussed the procedure including the risks, benefits and alternatives for the proposed anesthesia with the patient or authorized representative who has indicated his/her understanding and acceptance.   Dental advisory given  Plan Discussed with: CRNA  Anesthesia Plan Comments:         Anesthesia Quick Evaluation

## 2013-01-21 NOTE — Transfer of Care (Signed)
Immediate Anesthesia Transfer of Care Note  Patient: [name]  Procedure(s) Performed: Procedure(s): LAPAROSCOPIC ASSISTED VAGINAL HYSTERECTOMY (N/A) BILATERAL SALPINGECTOMY (N/A)  Patient Location: PACU  Anesthesia Type:General  Level of Consciousness: awake  Airway & Oxygen Therapy: Patient Spontanous Breathing  Post-op Assessment: Report given to PACU RN  Post vital signs: stable  Filed Vitals:   01/21/13 0607  BP: 131/72  Pulse: 84  Temp: 36.7 C  Resp: 18    Complications: No apparent anesthesia complications

## 2013-01-21 NOTE — Anesthesia Postprocedure Evaluation (Signed)
Anesthesia Post Note  Patient: [name]  Procedure(s) Performed: Procedure(s) (LRB): LAPAROSCOPIC ASSISTED VAGINAL HYSTERECTOMY (N/A) BILATERAL SALPINGECTOMY (N/A)  Anesthesia type: General  Patient location: [facility] Unit  Post pain: Pain level controlled  Post assessment: Post-op Vital signs reviewed  Last Vitals:  Filed Vitals:   01/21/13 1500  BP: 119/71  Pulse: 82  Temp: 36.8 C  Resp: 16    Post vital signs: Reviewed  Level of consciousness: sedated  Complications: No apparent anesthesia complications

## 2013-01-21 NOTE — Op Note (Signed)
NAMEELKE, [name]              ACCOUNT NO.:  [id number]  MEDICAL RECORD NO.:  [id number]  LOCATION:  9309                          FACILITY:  [facility]  PHYSICIAN:  Sherron Monday, MD        DATE OF BIRTH:  02/09/1976  DATE OF PROCEDURE:  01/21/2013 DATE OF DISCHARGE:                              OPERATIVE REPORT   PREOPERATIVE DIAGNOSIS:  Menorrhagia, adenomyosis, abnormal uterine bleeding, failed NovaSure endometrial ablation.  POSTOPERATIVE DIAGNOSIS:  Menorrhagia, adenomyosis, abnormal uterine bleeding, failed NovaSure endometrial ablation.  PROCEDURE:  Laparoscope-assisted vaginal hysterectomy, bilateral salpingectomy.  SURGEON:  Sherron Monday, MD.  ASSISTED:  Huel Cote, M.D.  ANESTHESIA:  General.  IV FLUIDS:  1700 mL.  URINE OUTPUT:  160 mL clear urine at the end of the procedure.  ESTIMATED BLOOD LOSS:  200 mL.  PATHOLOGY:  Uterus, tubes, and cervix.  COMPLICATIONS:  None.  PROCEDURE IN DETAIL:  After informed consent was reviewed the patient including risks, benefits, and alternatives of the surgical procedure. She was transferred to the operating room, placed on the table in the supine position where general anesthesia was induced and found be adequate.  She was then prepped and draped in the normal sterile fashion.  The Foley catheter was sterilely placed.  Using an open-sided speculum, a single-tooth tenaculum was used to grasp the anterior liver of cervix and a Hulka manipulator was placed.  Gloves were changed. Attention was turned to performing the abdominal portion of the case approximately 5-mm vertical infraumbilical incision was made using the Veress needle.  Pneumoperitoneum was obtained and thought to be adequate.  Using direct visualization, the trocar was placed.  The accessory ports were placed.  A brief pelvic survey was performed revealing normal uterus, tubes, and ovaries bilaterally.  Tubes had evidence of a previous tubal ligation being  performed.  Her tubes were removed from the mesosalpinx to the level of almost the cornu.  Then, the mesosalpinx was ligated on the left lobe of the uterine artery. There was some amount of bleeding that was made hemostatic.  Bladder flap was created.  The procedure was then performed on the right side at the same level.  The attention was then turned to the vaginal portion of the case.  The cervix was circumscribed with Bovie cautery, and anterior peritoneum was entered and the uterosacral ligaments were ligated and held.  Several bites were used to ligate the mesosalpinx and the uterus was removed.  The utero-ovarian were doubly ligated and the pedicles were inspected and found to be hemostatic.  An additional suture of 3-0 Vicryl was placed on the right with an area of bleeding.  The uterosacral ligaments were plicated and then held sutures were also tied together.  The cuff was closed with 2-0 Vicryl in a running fashion. The gloves were changed.  Attention was turned to the anterior abdominal portion, brief survey was performed.  Irrigation was performed. Hemostasis was noted.  The ureters were noted to be in a normal position and normal size.  The trocars were removed.  Peritoneum was evacuated and the incisions were closed with deep suture of 3-0 Vicryl and the skin was closed with Dermabond.  The  patient tolerated the procedure well.  Sponge, lap, and needle count was correct x2 per the operating staff.     Sherron Monday, MD     JB/MEDQ  D:  01/21/2013  T:  01/21/2013  Job:  [id number]

## 2013-01-22 ENCOUNTER — Encounter (HOSPITAL_COMMUNITY): Payer: Self-pay | Admitting: Obstetrics and Gynecology

## 2013-01-22 LAB — BASIC METABOLIC PANEL
BUN: 7 mg/dL (ref 6–23)
CO2: 27 mEq/L (ref 19–32)
Chloride: 103 mEq/L (ref 96–112)
Creatinine, Ser: 0.86 mg/dL (ref 0.50–1.10)
Glucose, Bld: 135 mg/dL — ABNORMAL HIGH (ref 70–99)

## 2013-01-22 LAB — CBC
HCT: 29.9 % — ABNORMAL LOW (ref 36.0–46.0)
MCH: 28.4 pg (ref 26.0–34.0)
MCHC: 33.8 g/dL (ref 30.0–36.0)
MCV: 84 fL (ref 78.0–100.0)
RDW: 13.1 % (ref 11.5–15.5)

## 2013-01-22 MED ORDER — INFLUENZA VIRUS VACC SPLIT PF IM SUSP
0.5000 mL | INTRAMUSCULAR | Status: AC
  Administered 2013-01-22: 0.5 mL via INTRAMUSCULAR
  Filled 2013-01-22: qty 0.5

## 2013-01-22 MED ORDER — PNEUMOCOCCAL VAC POLYVALENT 25 MCG/0.5ML IJ INJ
0.5000 mL | INJECTION | Freq: Once | INTRAMUSCULAR | Status: AC
  Administered 2013-01-22: 0.5 mL via INTRAMUSCULAR
  Filled 2013-01-22: qty 0.5

## 2013-01-22 MED ORDER — IBUPROFEN 800 MG PO TABS: 800.0000 mg | ORAL_TABLET | Freq: Three times a day (TID) | ORAL | Status: DC | PRN

## 2013-01-22 MED ORDER — PNEUMOCOCCAL VAC POLYVALENT 25 MCG/0.5ML IJ INJ: 0.5000 mL | INJECTION | INTRAMUSCULAR | Status: DC

## 2013-01-22 MED ORDER — OXYCODONE-ACETAMINOPHEN 5-325 MG PO TABS: 1.0000 | ORAL_TABLET | Freq: Four times a day (QID) | ORAL | Status: DC | PRN

## 2013-01-22 NOTE — Anesthesia Postprocedure Evaluation (Signed)
  Anesthesia Post-op Note  Patient: [name]  Procedure(s) Performed: Procedure(s): LAPAROSCOPIC ASSISTED VAGINAL HYSTERECTOMY (N/A) BILATERAL SALPINGECTOMY (N/A)  Patient Location: PACU  Anesthesia Type:General  Level of Consciousness: awake, alert  and oriented  Airway and Oxygen Therapy: Patient Spontanous Breathing  Post-op Pain: mild  Post-op Assessment: Post-op Vital signs reviewed, Patient's Cardiovascular Status Stable, Respiratory Function Stable, Patent Airway, No signs of Nausea or vomiting and Pain level controlled  Post-op Vital Signs: Reviewed and stable  Complications: No apparent anesthesia complications 

## 2013-01-22 NOTE — Progress Notes (Signed)
Pt  Medicated for pain   Out in wheelchair  Teaching complete

## 2013-01-22 NOTE — Discharge Summary (Signed)
Physician Discharge Summary  Patient ID: Jerusha Reising MRN: [id number] DOB/AGE: July 14, 1976 37 y.o.  Admit date: 01/21/2013 Discharge date: 01/22/2013  Admission Diagnoses: menorrhagia, pelvic pain, adenomyosis  Discharge Diagnoses: same, Principal Problem:   S/P laparoscopic assisted vaginal hysterectomy (LAVH) Active Problems:   Menorrhagia with regular cycle   S/P endometrial ablation   Discharged Condition: stable  Hospital Course: admitted for surgery.  Underwent LAVH, B salpingectomy, without difficulty.  CBC decreased as expected.  Able to tolerate po, ambulate.  Pain controlled, d/c home.  F/u 2 weeks  Consults: None  Significant Diagnostic Studies: labs: CBC  Treatments: IV hydration and surgery: LAVH, B salpingectomy  Discharge Exam: Blood pressure 107/72, pulse 107, temperature 98.1 F (36.7 C), temperature source Oral, resp. rate 19, height 5\' 5"  (1.651 m), weight 70.308 kg (155 lb), SpO2 100.00%. General appearance: alert and no distress Resp: clear to auscultation bilaterally Cardio: regular rate and rhythm GI: soft, non-tender; bowel sounds normal; no masses,  no organomegaly Extremities: extremities normal, atraumatic, no cyanosis or edema Incision/Wound:C/D/I  Disposition: 01-Home or Self Care  Discharge Orders   Future Orders Complete By Expires     Call MD for:  persistant nausea and vomiting  As directed     Call MD for:  severe uncontrolled pain  As directed     Diet - low sodium heart healthy  As directed     Discharge instructions  As directed     Comments:      Call [phone] with questions or problems    Driving Restrictions  As directed     Comments:      While taking strong pain medicine    Increase activity slowly  As directed     Lifting restrictions  As directed     Comments:      No greater than 25-30 lbs    May shower / Bathe  As directed     May walk up steps  As directed     Sexual Activity Restrictions  As directed     Comments:       Pelvic rest - no douching, tampons or sex for 6 weeks        Medication List    TAKE these medications       DULoxetine 20 MG capsule  Commonly known as:  CYMBALTA  Take 20 mg by mouth 2 (two) times daily.     hydrOXYzine 50 MG tablet  Commonly known as:  ATARAX/VISTARIL  Take 50 mg by mouth 3 (three) times daily.     ibuprofen 800 MG tablet  Commonly known as:  ADVIL,MOTRIN  Take 1 tablet (800 mg total) by mouth every 8 (eight) hours as needed (mild pain).     oxyCODONE-acetaminophen 5-325 MG per tablet  Commonly known as:  PERCOCET/ROXICET  Take 1-2 tablets by mouth every 6 (six) hours as needed.     traZODone 100 MG tablet  Commonly known as:  DESYREL  Take 100-200 mg by mouth at bedtime.           Follow-up Information   Follow up with [name],[name], MD. Schedule an appointment as soon as possible for a visit in 2 weeks.   Contact information:   [address] [place] [address] [phone]       Signed: [name],[name] 01/22/2013, 8:39 AM

## 2013-01-22 NOTE — Progress Notes (Signed)
1 Day Post-Op Procedure(s) (LRB): LAPAROSCOPIC ASSISTED VAGINAL HYSTERECTOMY (N/A) BILATERAL SALPINGECTOMY (N/A)  Subjective: Patient reports tolerating PO and no problems voiding.  Ambulating, pain controlled  Objective: I have reviewed patient's vital signs, intake and output and labs.  General: alert and no distress Resp: clear to auscultation bilaterally Cardio: regular rate and rhythm GI: soft, non-tender; bowel sounds normal; no masses,  no organomegaly and incision: clean, dry and intact Extremities: extremities normal, atraumatic, no cyanosis or edema Vaginal Bleeding: minimal  Assessment: s/p Procedure(s): LAPAROSCOPIC ASSISTED VAGINAL HYSTERECTOMY (N/A) BILATERAL SALPINGECTOMY (N/A): stable and progressing well  Plan: Advance diet Encourage ambulation Advance to PO medication Discharge home.  F/u 2 weeks. D/c with motrin, percocet.    LOS: 1 day    [name],[name] 01/22/2013, 8:31 AM

## 2013-05-19 ENCOUNTER — Ambulatory Visit: Payer: PRIVATE HEALTH INSURANCE

## 2013-05-25 ENCOUNTER — Ambulatory Visit (INDEPENDENT_AMBULATORY_CARE_PROVIDER_SITE_OTHER): Payer: PRIVATE HEALTH INSURANCE | Admitting: Internal Medicine

## 2013-05-25 ENCOUNTER — Ambulatory Visit: Payer: PRIVATE HEALTH INSURANCE

## 2013-05-25 VITALS — BP 126/80 | HR 86 | Temp 98.4°F | Resp 16 | Ht 65.0 in | Wt 149.0 lb

## 2013-05-25 DIAGNOSIS — F17200 Nicotine dependence, unspecified, uncomplicated: Secondary | ICD-10-CM

## 2013-05-25 DIAGNOSIS — E041 Nontoxic single thyroid nodule: Secondary | ICD-10-CM

## 2013-05-25 DIAGNOSIS — J45909 Unspecified asthma, uncomplicated: Secondary | ICD-10-CM

## 2013-05-25 DIAGNOSIS — F411 Generalized anxiety disorder: Secondary | ICD-10-CM

## 2013-05-25 DIAGNOSIS — S63602A Unspecified sprain of left thumb, initial encounter: Secondary | ICD-10-CM

## 2013-05-25 DIAGNOSIS — Z0000 Encounter for general adult medical examination without abnormal findings: Secondary | ICD-10-CM

## 2013-05-25 DIAGNOSIS — F419 Anxiety disorder, unspecified: Secondary | ICD-10-CM

## 2013-05-25 DIAGNOSIS — S6390XA Sprain of unspecified part of unspecified wrist and hand, initial encounter: Secondary | ICD-10-CM

## 2013-05-25 DIAGNOSIS — M2040 Other hammer toe(s) (acquired), unspecified foot: Secondary | ICD-10-CM

## 2013-05-25 DIAGNOSIS — R05 Cough: Secondary | ICD-10-CM

## 2013-05-25 DIAGNOSIS — F329 Major depressive disorder, single episode, unspecified: Secondary | ICD-10-CM

## 2013-05-25 DIAGNOSIS — G4700 Insomnia, unspecified: Secondary | ICD-10-CM

## 2013-05-25 LAB — POCT CBC
Lymph, poc: 1.7 (ref 0.6–3.4)
MCH, POC: 29.1 pg (ref 27–31.2)
MCHC: 32 g/dL (ref 31.8–35.4)
MID (cbc): 0.3 (ref 0–0.9)
MPV: 8.5 fL (ref 0–99.8)
POC LYMPH PERCENT: 48.7 %L (ref 10–50)
POC MID %: 7.7 %M (ref 0–12)
Platelet Count, POC: 189 10*3/uL (ref 142–424)
WBC: 3.5 10*3/uL — AB (ref 4.6–10.2)

## 2013-05-25 LAB — POCT URINALYSIS DIPSTICK
Bilirubin, UA: NEGATIVE
Glucose, UA: NEGATIVE
pH, UA: 7

## 2013-05-25 MED ORDER — ALBUTEROL SULFATE HFA 108 (90 BASE) MCG/ACT IN AERS: 2.0000 | INHALATION_SPRAY | Freq: Four times a day (QID) | RESPIRATORY_TRACT | Status: DC | PRN

## 2013-05-25 MED ORDER — AZITHROMYCIN 250 MG PO TABS: ORAL_TABLET | ORAL | Status: DC

## 2013-05-25 MED ORDER — BECLOMETHASONE DIPROPIONATE 40 MCG/ACT IN AERS: 2.0000 | INHALATION_SPRAY | Freq: Two times a day (BID) | RESPIRATORY_TRACT | Status: DC

## 2013-05-25 NOTE — Progress Notes (Signed)
Subjective:    Patient ID: [name], female    DOB: Jan 15, 1976, 37 y.o.   MRN: [id number]  HPI1st cpe many years New issues-stressed by living arrang  Toes hurt-long hx getting worse  17yo daughter stresses her  Fatigue-poor sleep/past dx OSA-no cpap?  Recent 3-4 mos coughing and wheezing-long term smoker with asthma-out of albut  occas HAs w/ lightheadness but noit enough to halt activity-worse when stressed Old issues-  Patient Active Problem List   Diagnosis Date Noted  . S/P laparoscopic assisted vaginal hysterectomy (LAVH) 01/21/2013  . Menorrhagia with regular cycle 01/20/2013  . S/P endometrial ablation 01/20/2013  . OBSTRUCTIVE SLEEP APNEA 07/20/2009  . ALLERGIC RHINITIS 07/20/2009  . ASTHMA 07/20/2009  . DYSPNEA 07/20/2009  meds-include Cymbalta and trazodone from [facility] [name] Pap 3/14-hiv neg Says no immun needed unemply?  Review of Systems Ros neg x PI and pain in the right thumb for the last 2 weeks noticed mainly at work and she uses her hands/no known injury    Objective:   Physical Exam BP 126/80  Pulse 86  Temp(Src) 98.4 F (36.9 C) (Oral)  Resp 16  Ht 5\' 5"  (1.651 m)  Wt 149 lb (67.586 kg)  BMI 24.79 kg/m2  SpO2 100% No acute distress HEENT clear except for a large soft nodule in the left upper pole of the thyroid/no regional adenopathy Heart regular without murmur Lungs clear except mild wheezing with forced expiration bilaterally Abdomen supple without organomegaly Extremities clear except hammertoes bilaterally and tenderness at the right MC #1 slight swelling of the joint but good range of motion and negative tenosynovitis Neurological intact Psychiatric-anxious    [facility] reading (PRIMARY) by  Dr.Doolitle=NAD      Results for orders placed in visit on 05/25/13  POCT CBC      Result Value Range   WBC 3.5 (*) 4.6 - 10.2 K/uL   Lymph, poc 1.7  0.6 - 3.4   POC LYMPH PERCENT 48.7  10 - 50 %L   MID (cbc) 0.3  0 - 0.9   POC MID %  7.7  0 - 12 %M   POC Granulocyte 1.5 (*) 2 - 6.9   Granulocyte percent 43.6  37 - 80 %G   RBC 4.67  4.04 - 5.48 M/uL   Hemoglobin 13.6  12.2 - 16.2 g/dL   HCT, POC 40.9  81.1 - 47.9 %   MCV 90.9  80 - 97 fL   MCH, POC 29.1  27 - 31.2 pg   MCHC 32.0  31.8 - 35.4 g/dL   RDW, POC 91.4     Platelet Count, POC 189  142 - 424 K/uL   MPV 8.5  0 - 99.8 fL  POCT URINALYSIS DIPSTICK      Result Value Range   Color, UA yellow     Clarity, UA cloudy     Glucose, UA neg     Bilirubin, UA neg     Ketones, UA trace     Spec Grav, UA 1.025     Blood, UA trace-intact     pH, UA 7.0     Protein, UA neg     Urobilinogen, UA 0.2     Nitrite, UA neg     Leukocytes, UA small (1+)      Assessment & Plan:  CPE - thyroid nodule -Sprain thumb -Hammertoes -asthma= reactive airway disease -Nicotine addiction -Prolonged cough secondary above -Sleep issues/insomnia/? Obstructive sleep apnea  Plan -Restart albuterol -Stop smoking -Use  Qvar for 3 months -Refer to podiatry -Thyroid ultrasound -Routine labs -Followup [facility] -Thumb spica splint with rehabilitation exercises for 3 weeks and followup if not well   Results for orders placed in visit on 05/25/13  COMPREHENSIVE METABOLIC PANEL      Result Value Range   Sodium 139  135 - 145 mEq/L   Potassium 4.4  3.5 - 5.3 mEq/L   Chloride 104  96 - 112 mEq/L   CO2 30  19 - 32 mEq/L   Glucose, Bld 81  70 - 99 mg/dL   BUN 9  6 - 23 mg/dL   Creat 1.61  0.96 - 0.45 mg/dL   Total Bilirubin 0.7  0.3 - 1.2 mg/dL   Alkaline Phosphatase 47  39 - 117 U/L   AST 19  0 - 37 U/L   ALT 14  0 - 35 U/L   Total Protein 7.1  6.0 - 8.3 g/dL   Albumin 4.1  3.5 - 5.2 g/dL   Calcium 9.1  8.4 - 40.9 mg/dL  T4, FREE      Result Value Range   Free T4 1.27  0.80 - 1.80 ng/dL  TSH      Result Value Range   TSH 1.687  0.350 - 4.500 uIU/mL  POCT CBC      Result Value Range   WBC 3.5 (*) 4.6 - 10.2 K/uL   Lymph, poc 1.7  0.6 - 3.4   POC LYMPH PERCENT 48.7  10  - 50 %L   MID (cbc) 0.3  0 - 0.9   POC MID % 7.7  0 - 12 %M   POC Granulocyte 1.5 (*) 2 - 6.9   Granulocyte percent 43.6  37 - 80 %G   RBC 4.67  4.04 - 5.48 M/uL   Hemoglobin 13.6  12.2 - 16.2 g/dL   HCT, POC 81.1  91.4 - 47.9 %   MCV 90.9  80 - 97 fL   MCH, POC 29.1  27 - 31.2 pg   MCHC 32.0  31.8 - 35.4 g/dL   RDW, POC 78.2     Platelet Count, POC 189  142 - 424 K/uL   MPV 8.5  0 - 99.8 fL  POCT URINALYSIS DIPSTICK      Result Value Range   Color, UA yellow     Clarity, UA cloudy     Glucose, UA neg     Bilirubin, UA neg     Ketones, UA trace     Spec Grav, UA 1.025     Blood, UA trace-intact     pH, UA 7.0     Protein, UA neg     Urobilinogen, UA 0.2     Nitrite, UA neg     Leukocytes, UA small (1+)

## 2013-05-26 LAB — COMPREHENSIVE METABOLIC PANEL
ALT: 14 U/L (ref 0–35)
AST: 19 U/L (ref 0–37)
BUN: 9 mg/dL (ref 6–23)
Creat: 0.84 mg/dL (ref 0.50–1.10)
Total Bilirubin: 0.7 mg/dL (ref 0.3–1.2)

## 2013-05-26 LAB — TSH: TSH: 1.687 u[IU]/mL (ref 0.350–4.500)

## 2013-05-28 ENCOUNTER — Encounter: Payer: Self-pay | Admitting: Internal Medicine

## 2013-05-31 ENCOUNTER — Encounter: Payer: Self-pay | Admitting: Internal Medicine

## 2013-06-01 ENCOUNTER — Encounter: Payer: Self-pay | Admitting: Internal Medicine

## 2013-06-24 ENCOUNTER — Other Ambulatory Visit: Payer: Medicare Other

## 2013-07-13 ENCOUNTER — Telehealth: Payer: Self-pay

## 2013-07-13 DIAGNOSIS — E041 Nontoxic single thyroid nodule: Secondary | ICD-10-CM

## 2013-07-13 NOTE — Telephone Encounter (Signed)
Pt is wanting a new referral for a thyroid ultrasound she had to cancel the original order   Best number [phone]

## 2013-07-14 NOTE — Telephone Encounter (Signed)
Ok to reorder

## 2013-07-15 NOTE — Telephone Encounter (Signed)
Order from July still in system put in new order as well to you [facility]

## 2013-07-22 ENCOUNTER — Ambulatory Visit
Admission: RE | Admit: 2013-07-22 | Discharge: 2013-07-22 | Disposition: A | Discharge status: Home / Self Care | Payer: Medicare Other | Source: Ambulatory Visit | Attending: Internal Medicine | Admitting: Internal Medicine

## 2013-07-22 DIAGNOSIS — E041 Nontoxic single thyroid nodule: Secondary | ICD-10-CM

## 2013-07-26 ENCOUNTER — Telehealth: Payer: Self-pay

## 2013-07-26 DIAGNOSIS — E041 Nontoxic single thyroid nodule: Secondary | ICD-10-CM

## 2013-07-26 NOTE — Telephone Encounter (Signed)
Pt is calling wanting to know if we've received her ultrasound results. She also has bronchitis and would like to speak to a nurse about it.  Please call [phone]

## 2013-07-27 NOTE — Telephone Encounter (Signed)
Notes Recorded by Tonye Pearson, MD on 07/22/2013 at 1:19 PM Call her //the nodule we felt appears to be OK But needs a Bx by FNAspiration by [facility]al radiology at [facility]---if OK set this up [facility] patient to advise. Put in order for the biopsy. Have advised patient to use Mucinex for the cough, and use the albuterol inhaler prn, if not better or she develops fever to come in for this. Patient agrees.

## 2013-08-12 ENCOUNTER — Ambulatory Visit
Admission: RE | Admit: 2013-08-12 | Discharge: 2013-08-12 | Disposition: A | Discharge status: Home / Self Care | Payer: Medicare Other | Source: Ambulatory Visit | Attending: Internal Medicine | Admitting: Internal Medicine

## 2013-08-12 ENCOUNTER — Other Ambulatory Visit (HOSPITAL_COMMUNITY)
Admission: RE | Admit: 2013-08-12 | Discharge: 2013-08-12 | Disposition: A | Discharge status: Home / Self Care | Payer: Medicare Other | Source: Ambulatory Visit | Attending: Interventional Radiology | Admitting: Interventional Radiology

## 2013-08-12 DIAGNOSIS — E041 Nontoxic single thyroid nodule: Secondary | ICD-10-CM

## 2013-08-19 ENCOUNTER — Telehealth: Payer: Self-pay

## 2013-08-19 DIAGNOSIS — E079 Disorder of thyroid, unspecified: Secondary | ICD-10-CM

## 2013-08-19 NOTE — Telephone Encounter (Signed)
Pt spoke to someone about her test results and now has an additional question about them.  [phone]

## 2013-08-19 NOTE — Telephone Encounter (Signed)
We referred pt for neck biopsy at [facility] about a week ago, looking for results  [phone]

## 2013-08-19 NOTE — Telephone Encounter (Signed)
Please call The thyroid nodule appears not to be a cancer but it is a type that should probably be removed because the biopsy is not 100% certain This will most likely not compromise her thyroid function in the future We should set up consult with Dr Wenda Low gen surg I have advised patient.  Made the referral to Dr Daphine Deutscher.

## 2013-08-20 NOTE — Telephone Encounter (Signed)
Called her again left message for her to call me back.  

## 2013-08-23 NOTE — Telephone Encounter (Signed)
LMOM to CB. 

## 2013-08-24 ENCOUNTER — Telehealth: Payer: Self-pay

## 2013-08-24 NOTE — Telephone Encounter (Signed)
She has medicaid and also has questions about this. Advised her she will need to see the provider on her medicaid card for the referrals she needs. She needs medical records sent. She will call for medical records request when she chooses her doctor for her Medicaid.

## 2013-08-24 NOTE — Telephone Encounter (Signed)
PT STATES SOMEONE CALLED HER LAST NIGHT REGARDING LAB RESULTS. PLEASE CALL [phone]

## 2013-08-24 NOTE — Telephone Encounter (Signed)
Spoke to her, finally, we have been playing phone tag.

## 2013-08-26 ENCOUNTER — Encounter (INDEPENDENT_AMBULATORY_CARE_PROVIDER_SITE_OTHER): Payer: Self-pay | Admitting: General Surgery

## 2013-08-26 ENCOUNTER — Ambulatory Visit (INDEPENDENT_AMBULATORY_CARE_PROVIDER_SITE_OTHER): Payer: Medicare Other | Admitting: General Surgery

## 2013-08-26 VITALS — BP 110/70 | HR 68 | Resp 18 | Ht 66.0 in | Wt 149.0 lb

## 2013-08-26 DIAGNOSIS — E079 Disorder of thyroid, unspecified: Secondary | ICD-10-CM

## 2013-08-26 NOTE — Progress Notes (Signed)
Patient ID: [name], female   DOB: 1976-10-14, 37 y.o.   MRN: [id number]  Chief Complaint  Patient presents with  . Other    Eval thyroid    HPI [name] is a 37 y.o. female.  This patient is referred by Dr. Merla Riches for evaluation of a left thyroid nodule which was found on routine physical exam a few months ago. She says that she has never noticed any lump in the area and even after the diagnosis has not noticed any mass. She denies any swallowing problems initially but then she did say that if she does not chew her food well then she may have some issues with swallowing. She denies any hoarseness or voice changes. She denies any family history of thyroid problems or endocrine problems such as pancreas or pituitary issues. She says that her weight has fluctuated and she does admit to some mild hair loss. She denies any palpitations and she does say that she is frequently sleepy. She denies any pain in the area or radiation history.  Her thyroid function tests are normal. HPI  Past Medical History  Diagnosis Date  . Depression   . Shortness of breath     on exertion  . Sleep apnea     mild per pt- no CPAP  . Headache(784.0)     migraines  . Anemia   . Asthma     no inhaler  . Menorrhagia with regular cycle 01/20/2013  . S/P endometrial ablation 01/20/2013  . S/P laparoscopic assisted vaginal hysterectomy (LAVH) 01/21/2013    Past Surgical History  Procedure Laterality Date  . Tubal ligation    . Breast lumpectomy      RT side  . Laparoscopic assisted vaginal hysterectomy N/A 01/21/2013    Procedure: LAPAROSCOPIC ASSISTED VAGINAL HYSTERECTOMY;  Surgeon: Sherron Monday, MD;  Location: [facility];  Service: Gynecology;  Laterality: N/A;  . Bilateral salpingectomy N/A 01/21/2013    Procedure: BILATERAL SALPINGECTOMY;  Surgeon: Sherron Monday, MD;  Location: [facility];  Service: Gynecology;  Laterality: N/A;    Family History  Problem Relation Age of Onset  . Depression Father      Social History History  Substance Use Topics  . Smoking status: Current Every Day Smoker -- 0.25 packs/day    Types: Cigarettes  . Smokeless tobacco: Not on file  . Alcohol Use: Yes     Comment: rarely    No Known Allergies  Current Outpatient Prescriptions  Medication Sig Dispense Refill  . albuterol (PROVENTIL HFA;VENTOLIN HFA) 108 (90 BASE) MCG/ACT inhaler Inhale 2 puffs into the lungs every 6 (six) hours as needed for wheezing.  1 Inhaler  5  . beclomethasone (QVAR) 40 MCG/ACT inhaler Inhale 2 puffs into the lungs 2 (two) times daily. For 2 months, then may discontinue if wheezing resolved  1 Inhaler  12  . DULoxetine (CYMBALTA) 20 MG capsule Take 20 mg by mouth 2 (two) times daily.      . hydrOXYzine (ATARAX/VISTARIL) 50 MG tablet Take 50 mg by mouth 3 (three) times daily.      Marland Kitchen ibuprofen (ADVIL,MOTRIN) 800 MG tablet Take 1 tablet (800 mg total) by mouth every 8 (eight) hours as needed (mild pain).  45 tablet  1  . oxyCODONE-acetaminophen (PERCOCET/ROXICET) 5-325 MG per tablet Take 1-2 tablets by mouth every 6 (six) hours as needed.  30 tablet  0  . traZODone (DESYREL) 100 MG tablet Take 100-200 mg by mouth at bedtime.  No current facility-administered medications for this visit.    Review of Systems Review of Systems All other review of systems negative or noncontributory except as stated in the HPI  Blood pressure 110/70, pulse 68, resp. rate 18, height 5\' 6"  (1.676 m), weight 149 lb (67.586 kg).  Physical Exam Physical Exam Physical Exam  Nursing note and vitals reviewed. Constitutional: She is oriented to person, place, and time. She appears well-developed and well-nourished. No distress.  HENT:  Head: Normocephalic and atraumatic.  Mouth/Throat: No oropharyngeal exudate.  Eyes: Conjunctivae and EOM are normal. Pupils are equal, round, and reactive to light. Right eye exhibits no discharge. Left eye exhibits no discharge. No scleral icterus.  Neck: Normal  range of motion. Neck supple. No tracheal deviation present. I think that I can appreciate the left thyroid nodule, but nontender and no LAD Cardiovascular: Normal rate, regular rhythm, normal heart sounds and intact distal pulses.   Pulmonary/Chest: Effort normal and breath sounds normal. No stridor. No respiratory distress. She has no wheezes.  Abdominal: Soft. Bowel sounds are normal. She exhibits no distension and no mass. There is no tenderness. There is no rebound and no guarding.  Musculoskeletal: Normal range of motion. She exhibits no edema and no tenderness.  Neurological: She is alert and oriented to person, place, and time.  Skin: Skin is warm and dry. No rash noted. She is not diaphoretic. No erythema. No pallor.  Psychiatric: She has a normal mood and affect. Her behavior is normal but she does seem a little "flighty". Judgment and thought content normal.    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 I had a long discussion with the patient regarding the diagnosis and the possible treatment options. 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 She seems to be pretty much asymptomatic from this lesion but given the pathology of the FNA, I have recommended left thyroid lobectomy.  We also discussed the options of continued observation and surveillance versus thyroid lobectomy versus total thyroidectomy and the pros and cons of each option.  Since the contralateral side is normal on ultrasound and her thyroid function tests are normal, I have recommended left thyroid lobectomy for definitive diagnosis with the possibility that we may need to perform completion thyroidectomy depending on the pathology and if this  returns malignant. She expressed understanding of this although I did need to repeat this defect several times. We also discussed the other risks of the procedure including infection, bleeding and hematoma, scarring and poor cosmesis, recurrence, need for completion thyroidectomy, possible need for thyroid hormone supplementation lifelong, and injury to recurrent laryngeal nerves and voice changes or loss of voice, injury to parathyroid glands and the possible need for permanent calcium supplementation. 6% or standing and agreed with the plans for left thyroid lobectomy and possible total thyroidectomy depending on final pathology    Plan    We will plan for left thyroid lobectomy when available        Justn Quale DAVID 08/26/2013, 10:28 AM

## 2013-09-14 NOTE — Progress Notes (Signed)
Dr. Biagio Quint,          Please enter pt's preop orders in Epic.  Thanks.

## 2013-09-16 ENCOUNTER — Other Ambulatory Visit: Payer: Self-pay

## 2013-09-23 ENCOUNTER — Encounter (HOSPITAL_COMMUNITY): Payer: Self-pay | Admitting: Pharmacy Technician

## 2013-09-24 NOTE — Patient Instructions (Addendum)
20      Your procedure is scheduled on: Friday 10/01/2013  Report to [facility] at  1030 AM.  Call this number if you have problems the night before or morning of surgery:   [phone]   Remember:             IF YOU USE CPAP,BRING MASK AND TUBING AM OF SURGERY!   Do not eat food AFTER MIDNIGHT! MAY HAVE CLEAR LIQUIDS FROM MIDNIGHT UP UNTIL 0700 AM THE MORNING OF SURGERY AND NOTHING AFTER 1030!  Take these medicines the morning of surgery with A SIP OF WATER: Cymbalta, Qvar inhaler and Albuterol inhaler if needed   Do not bring valuables to the hospital. [facility] IS NOT RESPONSIBLE  FOR ANY BELONGINGS OR VALUABLES BROUGHT TO HOSPITAL.  Marland Kitchen  Leave suitcase in the car. After surgery it may be brought to your room.  For patients admitted to the hospital, checkout time is 11:00 AM the day of              Discharge.    DO NOT WEAR JEWELRY , MAKE-UP, LOTIONS,POWDERS,PERFUMES!             WOMEN -DO NOT SHAVE LEGS OR UNDERARMS 12 HRS. BEFORE SURGERY!               MEN MAY SHAVE AS USUAL!             CONTACTS,DENTURES OR BRIDGEWORK, FALSE EYELASHES MAY  NOT BE WORN INTO SURGERY!                                           Patients discharged the day of surgery will not be allowed to drive home.  If going home the same day of surgery, must have someone stay with you first 24 hrs.at home and arrange for someone to drive you home from the Hospital.                        YOUR DRIVER IS:N/A   Special Instructions:             Please read over the following fact sheets that you were given:             1. [place] PREPARING FOR SURGERY SHEET              2.INC[facility] SPIROMETRY                                        [place].Anida Deol,RN,BSN     [id number]                FAILURE TO FOLLOW THESE INSTRUCTIONS MAY RESULT IN  CANCELLATION OF YOUR SURGERY!               Patient Signature:___________________________

## 2013-09-27 ENCOUNTER — Encounter (HOSPITAL_COMMUNITY): Payer: Self-pay

## 2013-09-27 ENCOUNTER — Encounter (HOSPITAL_COMMUNITY)
Admission: RE | Admit: 2013-09-27 | Discharge: 2013-09-27 | Disposition: A | Discharge status: Home / Self Care | Payer: Medicare Other | Source: Ambulatory Visit | Attending: General Surgery | Admitting: General Surgery

## 2013-09-27 ENCOUNTER — Inpatient Hospital Stay (HOSPITAL_COMMUNITY): Admission: RE | Admit: 2013-09-27 | Payer: Medicare Other | Source: Ambulatory Visit

## 2013-09-27 DIAGNOSIS — Z01812 Encounter for preprocedural laboratory examination: Secondary | ICD-10-CM | POA: Insufficient documentation

## 2013-09-27 DIAGNOSIS — Z01818 Encounter for other preprocedural examination: Secondary | ICD-10-CM | POA: Insufficient documentation

## 2013-09-27 LAB — CBC
MCV: 87.4 fL (ref 78.0–100.0)
Platelets: 182 10*3/uL (ref 150–400)
RBC: 4.35 MIL/uL (ref 3.87–5.11)
RDW: 13 % (ref 11.5–15.5)
WBC: 3.3 10*3/uL — ABNORMAL LOW (ref 4.0–10.5)

## 2013-09-27 NOTE — Progress Notes (Signed)
Dr. Biagio Quint please enter orders in [facility] as no orders in EPIC at time of pre-op appointment today at 1pm.! Thank you.

## 2013-10-01 ENCOUNTER — Observation Stay (HOSPITAL_COMMUNITY)
Admission: RE | Admit: 2013-10-01 | Discharge: 2013-10-02 | Disposition: A | Discharge status: Home / Self Care | Payer: Medicare Other | Source: Ambulatory Visit | Attending: General Surgery | Admitting: General Surgery

## 2013-10-01 ENCOUNTER — Ambulatory Visit (HOSPITAL_COMMUNITY): Payer: Medicare Other | Admitting: Anesthesiology

## 2013-10-01 ENCOUNTER — Encounter (HOSPITAL_COMMUNITY): Payer: Self-pay | Admitting: *Deleted

## 2013-10-01 ENCOUNTER — Encounter (HOSPITAL_COMMUNITY)
Admission: RE | Disposition: A | Discharge status: Home / Self Care | Payer: Self-pay | Source: Ambulatory Visit | Attending: General Surgery

## 2013-10-01 ENCOUNTER — Encounter (HOSPITAL_COMMUNITY): Payer: Medicare Other | Admitting: Anesthesiology

## 2013-10-01 DIAGNOSIS — E041 Nontoxic single thyroid nodule: Secondary | ICD-10-CM

## 2013-10-01 DIAGNOSIS — J45909 Unspecified asthma, uncomplicated: Secondary | ICD-10-CM | POA: Insufficient documentation

## 2013-10-01 DIAGNOSIS — D34 Benign neoplasm of thyroid gland: Principal | ICD-10-CM | POA: Insufficient documentation

## 2013-10-01 DIAGNOSIS — Z79899 Other long term (current) drug therapy: Secondary | ICD-10-CM | POA: Insufficient documentation

## 2013-10-01 DIAGNOSIS — G4730 Sleep apnea, unspecified: Secondary | ICD-10-CM | POA: Insufficient documentation

## 2013-10-01 DIAGNOSIS — F17200 Nicotine dependence, unspecified, uncomplicated: Secondary | ICD-10-CM | POA: Insufficient documentation

## 2013-10-01 HISTORY — PX: THYROID LOBECTOMY: SHX420

## 2013-10-01 LAB — BASIC METABOLIC PANEL
BUN: 10 mg/dL (ref 6–23)
Calcium: 8.7 mg/dL (ref 8.4–10.5)
Chloride: 102 mEq/L (ref 96–112)
GFR calc Af Amer: 83 mL/min — ABNORMAL LOW (ref >90)
Glucose, Bld: 84 mg/dL (ref 70–99)
Potassium: 3.9 mEq/L (ref 3.5–5.1)
Sodium: 136 mEq/L (ref 135–145)

## 2013-10-01 SURGERY — LOBECTOMY, THYROID
Anesthesia: General | Laterality: Left | Wound class: Clean

## 2013-10-01 MED ORDER — ONDANSETRON HCL 4 MG/2ML IJ SOLN
4.0000 mg | Freq: Four times a day (QID) | INTRAMUSCULAR | Status: DC | PRN
  Administered 2013-10-01: 4 mg via INTRAVENOUS
  Filled 2013-10-01: qty 2

## 2013-10-01 MED ORDER — GLYCOPYRROLATE 0.2 MG/ML IJ SOLN
INTRAMUSCULAR | Status: AC
  Filled 2013-10-01: qty 1

## 2013-10-01 MED ORDER — MORPHINE SULFATE 10 MG/ML IJ SOLN: 1.0000 mg | INTRAMUSCULAR | Status: DC | PRN

## 2013-10-01 MED ORDER — LACTATED RINGERS IV SOLN
INTRAVENOUS | Status: DC
  Administered 2013-10-01: 15:00:00 via INTRAVENOUS
  Administered 2013-10-01: 1000 mL via INTRAVENOUS

## 2013-10-01 MED ORDER — HYDROMORPHONE HCL PF 1 MG/ML IJ SOLN
0.2500 mg | INTRAMUSCULAR | Status: DC | PRN
  Administered 2013-10-01 (×4): 0.5 mg via INTRAVENOUS

## 2013-10-01 MED ORDER — ONDANSETRON HCL 4 MG PO TABS
4.0000 mg | ORAL_TABLET | Freq: Four times a day (QID) | ORAL | Status: DC | PRN
  Filled 2013-10-01: qty 1

## 2013-10-01 MED ORDER — FENTANYL CITRATE 0.05 MG/ML IJ SOLN
INTRAMUSCULAR | Status: AC
  Filled 2013-10-01: qty 5

## 2013-10-01 MED ORDER — CEFAZOLIN SODIUM-DEXTROSE 2-3 GM-% IV SOLR
INTRAVENOUS | Status: AC
  Filled 2013-10-01: qty 50

## 2013-10-01 MED ORDER — KCL IN DEXTROSE-NACL 20-5-0.45 MEQ/L-%-% IV SOLN
INTRAVENOUS | Status: DC
  Administered 2013-10-01: 50 mL/h via INTRAVENOUS
  Filled 2013-10-01 (×2): qty 1000

## 2013-10-01 MED ORDER — DEXAMETHASONE SODIUM PHOSPHATE 10 MG/ML IJ SOLN
INTRAMUSCULAR | Status: DC | PRN
  Administered 2013-10-01: 10 mg via INTRAVENOUS

## 2013-10-01 MED ORDER — BECLOMETHASONE DIPROPIONATE 40 MCG/ACT IN AERS
2.0000 | INHALATION_SPRAY | Freq: Two times a day (BID) | RESPIRATORY_TRACT | Status: DC
  Filled 2013-10-01: qty 8.7

## 2013-10-01 MED ORDER — LIDOCAINE HCL (CARDIAC) 20 MG/ML IV SOLN
INTRAVENOUS | Status: AC
  Filled 2013-10-01: qty 5

## 2013-10-01 MED ORDER — CEFAZOLIN SODIUM-DEXTROSE 2-3 GM-% IV SOLR
2.0000 g | INTRAVENOUS | Status: AC
  Administered 2013-10-01: 2 g via INTRAVENOUS

## 2013-10-01 MED ORDER — ONDANSETRON HCL 4 MG/2ML IJ SOLN
INTRAMUSCULAR | Status: AC
  Filled 2013-10-01: qty 2

## 2013-10-01 MED ORDER — EPHEDRINE SULFATE 50 MG/ML IJ SOLN
INTRAMUSCULAR | Status: AC
  Filled 2013-10-01: qty 1

## 2013-10-01 MED ORDER — ROCURONIUM BROMIDE 100 MG/10ML IV SOLN
INTRAVENOUS | Status: AC
  Filled 2013-10-01: qty 1

## 2013-10-01 MED ORDER — ONDANSETRON HCL 4 MG/2ML IJ SOLN
INTRAMUSCULAR | Status: DC | PRN
  Administered 2013-10-01: 4 mg via INTRAVENOUS

## 2013-10-01 MED ORDER — LACTATED RINGERS IV SOLN: INTRAVENOUS | Status: DC

## 2013-10-01 MED ORDER — PROPOFOL 10 MG/ML IV BOLUS
INTRAVENOUS | Status: AC
  Filled 2013-10-01: qty 20

## 2013-10-01 MED ORDER — HYDROMORPHONE HCL PF 1 MG/ML IJ SOLN
INTRAMUSCULAR | Status: AC
  Filled 2013-10-01: qty 1

## 2013-10-01 MED ORDER — METOCLOPRAMIDE HCL 5 MG/ML IJ SOLN
INTRAMUSCULAR | Status: AC
  Filled 2013-10-01: qty 2

## 2013-10-01 MED ORDER — MIDAZOLAM HCL 5 MG/5ML IJ SOLN
INTRAMUSCULAR | Status: DC | PRN
  Administered 2013-10-01: 2 mg via INTRAVENOUS

## 2013-10-01 MED ORDER — HYDROCODONE-ACETAMINOPHEN 7.5-325 MG/15ML PO SOLN
10.0000 mL | ORAL | Status: DC | PRN
  Administered 2013-10-02: 15 mL via ORAL
  Filled 2013-10-01: qty 15

## 2013-10-01 MED ORDER — MORPHINE SULFATE 2 MG/ML IJ SOLN
INTRAMUSCULAR | Status: AC
  Administered 2013-10-01: 2 mg via INTRAVENOUS
  Filled 2013-10-01: qty 1

## 2013-10-01 MED ORDER — GLYCOPYRROLATE 0.2 MG/ML IJ SOLN
INTRAMUSCULAR | Status: AC
  Filled 2013-10-01: qty 3

## 2013-10-01 MED ORDER — ACETAMINOPHEN 10 MG/ML IV SOLN
1000.0000 mg | Freq: Once | INTRAVENOUS | Status: DC
  Administered 2013-10-01: 1000 mg via INTRAVENOUS
  Filled 2013-10-01: qty 100

## 2013-10-01 MED ORDER — METOCLOPRAMIDE HCL 5 MG/ML IJ SOLN
INTRAMUSCULAR | Status: DC | PRN
  Administered 2013-10-01: 10 mg via INTRAVENOUS

## 2013-10-01 MED ORDER — MIDAZOLAM HCL 2 MG/2ML IJ SOLN
INTRAMUSCULAR | Status: AC
  Filled 2013-10-01: qty 2

## 2013-10-01 MED ORDER — GLYCOPYRROLATE 0.2 MG/ML IJ SOLN
INTRAMUSCULAR | Status: DC | PRN
  Administered 2013-10-01: 0.4 mg via INTRAVENOUS

## 2013-10-01 MED ORDER — PROMETHAZINE HCL 25 MG/ML IJ SOLN
12.5000 mg | Freq: Four times a day (QID) | INTRAMUSCULAR | Status: DC | PRN
  Administered 2013-10-01 – 2013-10-02 (×2): 12.5 mg via INTRAVENOUS
  Filled 2013-10-01 (×2): qty 1

## 2013-10-01 MED ORDER — PROPOFOL 10 MG/ML IV BOLUS
INTRAVENOUS | Status: DC | PRN
  Administered 2013-10-01: 30 mg via INTRAVENOUS
  Administered 2013-10-01: 170 mg via INTRAVENOUS

## 2013-10-01 MED ORDER — SODIUM CHLORIDE 0.9 % IJ SOLN
INTRAMUSCULAR | Status: AC
  Filled 2013-10-01: qty 10

## 2013-10-01 MED ORDER — NEOSTIGMINE METHYLSULFATE 1 MG/ML IJ SOLN
INTRAMUSCULAR | Status: DC | PRN
  Administered 2013-10-01: 3 mg via INTRAVENOUS

## 2013-10-01 MED ORDER — FENTANYL CITRATE 0.05 MG/ML IJ SOLN
INTRAMUSCULAR | Status: AC
  Filled 2013-10-01: qty 2

## 2013-10-01 MED ORDER — MORPHINE SULFATE 10 MG/ML IJ SOLN
1.0000 mg | INTRAMUSCULAR | Status: DC | PRN
  Administered 2013-10-01 – 2013-10-02 (×2): 4 mg via INTRAVENOUS
  Filled 2013-10-01 (×2): qty 1

## 2013-10-01 MED ORDER — FENTANYL CITRATE 0.05 MG/ML IJ SOLN
INTRAMUSCULAR | Status: DC | PRN
  Administered 2013-10-01: 25 ug via INTRAVENOUS
  Administered 2013-10-01: 100 ug via INTRAVENOUS
  Administered 2013-10-01: 25 ug via INTRAVENOUS
  Administered 2013-10-01: 100 ug via INTRAVENOUS
  Administered 2013-10-01 (×2): 25 ug via INTRAVENOUS
  Administered 2013-10-01: 50 ug via INTRAVENOUS

## 2013-10-01 MED ORDER — SUCCINYLCHOLINE CHLORIDE 20 MG/ML IJ SOLN
INTRAMUSCULAR | Status: DC | PRN
  Administered 2013-10-01: 100 mg via INTRAVENOUS

## 2013-10-01 MED ORDER — DULOXETINE HCL 20 MG PO CPEP
20.0000 mg | ORAL_CAPSULE | Freq: Two times a day (BID) | ORAL | Status: DC
  Administered 2013-10-01 – 2013-10-02 (×2): 20 mg via ORAL
  Filled 2013-10-01 (×4): qty 1

## 2013-10-01 MED ORDER — LIDOCAINE HCL (CARDIAC) 20 MG/ML IV SOLN
INTRAVENOUS | Status: DC | PRN
  Administered 2013-10-01: 80 mg via INTRAVENOUS
  Administered 2013-10-01: 100 mg via INTRAVENOUS

## 2013-10-01 MED ORDER — ALBUTEROL SULFATE HFA 108 (90 BASE) MCG/ACT IN AERS
2.0000 | INHALATION_SPRAY | Freq: Four times a day (QID) | RESPIRATORY_TRACT | Status: DC | PRN
  Filled 2013-10-01: qty 6.7

## 2013-10-01 MED ORDER — ACETAMINOPHEN 10 MG/ML IV SOLN
1000.0000 mg | Freq: Four times a day (QID) | INTRAVENOUS | Status: AC
  Administered 2013-10-02 (×2): 1000 mg via INTRAVENOUS
  Filled 2013-10-01 (×2): qty 100

## 2013-10-01 MED ORDER — ROCURONIUM BROMIDE 100 MG/10ML IV SOLN
INTRAVENOUS | Status: DC | PRN
  Administered 2013-10-01: 30 mg via INTRAVENOUS

## 2013-10-01 MED ORDER — FLUTICASONE PROPIONATE HFA 44 MCG/ACT IN AERO
2.0000 | INHALATION_SPRAY | Freq: Two times a day (BID) | RESPIRATORY_TRACT | Status: DC
  Administered 2013-10-01 – 2013-10-02 (×2): 2 via RESPIRATORY_TRACT
  Filled 2013-10-01: qty 10.6

## 2013-10-01 MED ORDER — TRAZODONE HCL 100 MG PO TABS
100.0000 mg | ORAL_TABLET | Freq: Every day | ORAL | Status: DC
  Administered 2013-10-01: 100 mg via ORAL
  Filled 2013-10-01 (×2): qty 2

## 2013-10-01 SURGICAL SUPPLY — 40 items
APL SKNCLS STERI-STRIP NONHPOA (GAUZE/BANDAGES/DRESSINGS) ×1
ATTRACTOMAT 16X20 MAGNETIC DRP (DRAPES) ×2 IMPLANT
BENZOIN TINCTURE PRP APPL 2/3 (GAUZE/BANDAGES/DRESSINGS) ×2 IMPLANT
BLADE HEX COATED 2.75 (ELECTRODE) ×2 IMPLANT
BLADE SURG 15 STRL LF DISP TIS (BLADE) ×1 IMPLANT
BLADE SURG 15 STRL SS (BLADE) ×2
CANISTER SUCTION 2500CC (MISCELLANEOUS) ×2 IMPLANT
CHLORAPREP W/TINT 10.5 ML (MISCELLANEOUS) ×2 IMPLANT
CLIP TI MEDIUM 6 (CLIP) ×4 IMPLANT
CLIP TI WIDE RED SMALL 6 (CLIP) ×4 IMPLANT
DISSECTOR ROUND CHERRY 3/8 STR (MISCELLANEOUS) ×1 IMPLANT
DRAPE PED LAPAROTOMY (DRAPES) ×2 IMPLANT
DRESSING SURGICEL FIBRLLR 1X2 (HEMOSTASIS) ×1 IMPLANT
DRSG SURGICEL FIBRILLAR 1X2 (HEMOSTASIS)
ELECT REM PT RETURN 9FT ADLT (ELECTROSURGICAL) ×2
ELECTRODE REM PT RTRN 9FT ADLT (ELECTROSURGICAL) ×1 IMPLANT
GAUZE SPONGE 4X4 16PLY XRAY LF (GAUZE/BANDAGES/DRESSINGS) ×3 IMPLANT
GLOVE SURG ORTHO 8.0 STRL STRW (GLOVE) ×4 IMPLANT
GOWN PREVENTION PLUS LG XLONG (DISPOSABLE) ×1 IMPLANT
GOWN STRL REIN XL XLG (GOWN DISPOSABLE) ×6 IMPLANT
HEMOSTAT SURGICEL 4X8 (HEMOSTASIS) ×1 IMPLANT
KIT BASIN OR (CUSTOM PROCEDURE TRAY) ×2 IMPLANT
NS IRRIG 1000ML POUR BTL (IV SOLUTION) ×2 IMPLANT
PACK BASIC VI WITH GOWN DISP (CUSTOM PROCEDURE TRAY) ×2 IMPLANT
PENCIL BUTTON HOLSTER BLD 10FT (ELECTRODE) ×2 IMPLANT
SHEARS HARMONIC 9CM CVD (BLADE) ×2 IMPLANT
SPONGE GAUZE 4X4 12PLY (GAUZE/BANDAGES/DRESSINGS) IMPLANT
STAPLER VISISTAT 35W (STAPLE) ×1 IMPLANT
STRIP CLOSURE SKIN 1/2X4 (GAUZE/BANDAGES/DRESSINGS) ×1 IMPLANT
SUT MNCRL AB 4-0 PS2 18 (SUTURE) ×2 IMPLANT
SUT MON AB 5-0 PS2 18 (SUTURE) ×1 IMPLANT
SUT SILK 2 0 (SUTURE) ×2
SUT SILK 2-0 18XBRD TIE 12 (SUTURE) ×1 IMPLANT
SUT SILK 3 0 (SUTURE)
SUT SILK 3-0 18XBRD TIE 12 (SUTURE) IMPLANT
SUT VIC AB 3-0 SH 18 (SUTURE) ×2 IMPLANT
SUT VIC AB 4-0 RB1 18 (SUTURE) ×1 IMPLANT
SYR BULB IRRIGATION 50ML (SYRINGE) ×2 IMPLANT
TOWEL OR 17X26 10 PK STRL BLUE (TOWEL DISPOSABLE) ×3 IMPLANT
YANKAUER SUCT BULB TIP 10FT TU (MISCELLANEOUS) ×2 IMPLANT

## 2013-10-01 NOTE — H&P (Signed)
[name] is a 37 y.o. female. This patient is referred by Dr. Merla Riches for evaluation of a left thyroid nodule which was found on routine physical exam a few months ago. She says that she has never noticed any lump in the area and even after the diagnosis has not noticed any mass. She denies any swallowing problems initially but then she did say that if she does not chew her food well then she may have some issues with swallowing. She denies any hoarseness or voice changes. She denies any family history of thyroid problems or endocrine problems such as pancreas or pituitary issues. She says that her weight has fluctuated and she does admit to some mild hair loss. She denies any palpitations and she does say that she is frequently sleepy. She denies any pain in the area or radiation history. Her thyroid function tests are normal.  She denies any changes from prior.   HPI  Past Medical History   Diagnosis  Date   .  Depression    .  Shortness of breath      on exertion   .  Sleep apnea      mild per pt- no CPAP   .  Headache(784.0)      migraines   .  Anemia    .  Asthma      no inhaler   .  Menorrhagia with regular cycle  01/20/2013   .  S/P endometrial ablation  01/20/2013   .  S/P laparoscopic assisted vaginal hysterectomy (LAVH)  01/21/2013    Past Surgical History   Procedure  Laterality  Date   .  Tubal ligation     .  Breast lumpectomy       RT side   .  Laparoscopic assisted vaginal hysterectomy  N/A  01/21/2013     Procedure: LAPAROSCOPIC ASSISTED VAGINAL HYSTERECTOMY; Surgeon: Sherron Monday, MD; Location: [facility]; Service: Gynecology; Laterality: N/A;   .  Bilateral salpingectomy  N/A  01/21/2013     Procedure: BILATERAL SALPINGECTOMY; Surgeon: Sherron Monday, MD; Location: [facility]; Service: Gynecology; Laterality: N/A;    Family History   Problem  Relation  Age of Onset   .  Depression  Father    Social History  History   Substance Use Topics   .  Smoking status:  Current  Every Day Smoker -- 0.25 packs/day     Types:  Cigarettes   .  Smokeless tobacco:  Not on file   .  Alcohol Use:  Yes      Comment: rarely   No Known Allergies  Current Outpatient Prescriptions   Medication  Sig  Dispense  Refill   .  albuterol (PROVENTIL HFA;VENTOLIN HFA) 108 (90 BASE) MCG/ACT inhaler  Inhale 2 puffs into the lungs every 6 (six) hours as needed for wheezing.  1 Inhaler  5   .  beclomethasone (QVAR) 40 MCG/ACT inhaler  Inhale 2 puffs into the lungs 2 (two) times daily. For 2 months, then may discontinue if wheezing resolved  1 Inhaler  12   .  DULoxetine (CYMBALTA) 20 MG capsule  Take 20 mg by mouth 2 (two) times daily.     .  hydrOXYzine (ATARAX/VISTARIL) 50 MG tablet  Take 50 mg by mouth 3 (three) times daily.     Marland Kitchen  ibuprofen (ADVIL,MOTRIN) 800 MG tablet  Take 1 tablet (800 mg total) by mouth every 8 (eight) hours as needed (mild pain).  45 tablet  1   .  oxyCODONE-acetaminophen (PERCOCET/ROXICET) 5-325 MG per tablet  Take 1-2 tablets by mouth every 6 (six) hours as needed.  30 tablet  0   .  traZODone (DESYREL) 100 MG tablet  Take 100-200 mg by mouth at bedtime.      No current facility-administered medications for this visit.   Review of Systems  Review of Systems  All other review of systems negative or noncontributory except as stated in the HPI  Wt Readings from Last 3 Encounters:  09/27/13 156 lb (70.761 kg)  08/26/13 149 lb (67.586 kg)  05/25/13 149 lb (67.586 kg)   Temp Readings from Last 3 Encounters:  10/01/13 97.6 F (36.4 C) Oral  10/01/13 97.6 F (36.4 C) Oral  09/27/13 97.8 F (36.6 C) Oral   BP Readings from Last 3 Encounters:  10/01/13 142/92  10/01/13 142/92  09/27/13 128/79   Pulse Readings from Last 3 Encounters:  10/01/13 85  10/01/13 85  09/27/13 82    Physical Exam  Physical Exam  Physical Exam  Nursing note and vitals reviewed.  Constitutional: She is oriented to person, place, and time. She appears well-developed and  well-nourished. No distress.  HENT:  Head: Normocephalic and atraumatic.  Mouth/Throat: No oropharyngeal exudate.  Eyes: Conjunctivae and EOM are normal. Pupils are equal, round, and reactive to light. Right eye exhibits no discharge. Left eye exhibits no discharge. No scleral icterus.  Neck: Normal range of motion. Neck supple. No tracheal deviation present. I think that I can appreciate the left thyroid nodule, but nontender and no LAD  Cardiovascular: Normal rate, regular rhythm, normal heart sounds and intact distal pulses.  Pulmonary/Chest: Effort normal and breath sounds normal. No stridor. No respiratory distress. She has no wheezes.  Abdominal: Soft. Bowel sounds are normal. She exhibits no distension and no mass. There is no tenderness. There is no rebound and no guarding.  Musculoskeletal: Normal range of motion. She exhibits no edema and no tenderness.  Neurological: She is alert and oriented to person, place, and time.  Skin: Skin is warm and dry. No rash noted. She is not diaphoretic. No erythema. No pallor.  Psychiatric: She has a normal mood and affect. Her behavior is normal but she does seem a little "flighty". Judgment and thought content normal.  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 I had a long discussion with the patient regarding the diagnosis and the possible treatment options. 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 She seems to be pretty much asymptomatic from this lesion but given the pathology of the FNA, I have recommended left thyroid lobectomy. We also discussed the options of continued observation and surveillance versus thyroid lobectomy versus total thyroidectomy and the pros and  cons of each option. Since the contralateral side is normal on ultrasound and her thyroid function tests are normal, I have recommended left thyroid lobectomy for definitive diagnosis with the possibility that we may need to perform completion thyroidectomy depending on the pathology and if this returns malignant. She expressed understanding of this although I did need to repeat this defect several times. We also discussed the other risks of the procedure including infection, bleeding and hematoma, scarring and poor cosmesis, recurrence, need for completion  thyroidectomy, possible need for thyroid hormone supplementation lifelong, and injury to recurrent laryngeal nerves and voice changes or loss of voice, injury to parathyroid glands and the possible need for permanent calcium supplementation. I have seen and evaluated her in the preop area.  Risks of the procedure again discussed including nerve injury, voice changes, hypocalcemia, need for reoperation and she expressed understanding and desires to proceed with left thyroid lobectomy.  Site marked.

## 2013-10-01 NOTE — Brief Op Note (Signed)
10/01/2013  3:34 PM  PATIENT:  [name]  37 y.o. female  PRE-OPERATIVE DIAGNOSIS:  left thyroid nodule  POST-OPERATIVE DIAGNOSIS:  left thyroid nodule  PROCEDURE:  Procedure(s): THYROID LOBECTOMY (Left)  SURGEON:  Surgeon(s) and Role:    * Lodema Pilot, DO - Primary    * Almond Lint, MD - Assisting  PHYSICIAN ASSISTANT:   ASSISTANTS: Byerly   ANESTHESIA:   general  EBL:  Total I/O In: 1600 [I.V.:1600] Out: 50 [Blood:50]  BLOOD ADMINISTERED:none  DRAINS: none   LOCAL MEDICATIONS USED:  NONE  SPECIMEN:  Source of Specimen:  left thyroid lobectomy, suture superior pole  DISPOSITION OF SPECIMEN:  PATHOLOGY  COUNTS:  YES  TOURNIQUET:  * No tourniquets in log *  DICTATION: .Other Dictation: Dictation Number dictated  PLAN OF CARE: Admit for overnight observation  PATIENT DISPOSITION:  PACU - hemodynamically stable.   Delay start of Pharmacological VTE agent (>24hrs) due to surgical blood loss or risk of bleeding: no

## 2013-10-01 NOTE — Transfer of Care (Signed)
Immediate Anesthesia Transfer of Care Note  Patient: [name]  Procedure(s) Performed: Procedure(s): THYROID LOBECTOMY (Left)  Patient Location: PACU  Anesthesia Type:General  Level of Consciousness: Patient easily awoken, sedated, comfortable, cooperative, following commands, responds to stimulation.   Airway & Oxygen Therapy: Patient spontaneously breathing, ventilating well, oxygen via simple oxygen mask.  Post-op Assessment: Report given to PACU RN, vital signs reviewed and stable, moving all extremities.   Post vital signs: Reviewed and stable.  Complications: No apparent anesthesia complications

## 2013-10-01 NOTE — Anesthesia Postprocedure Evaluation (Signed)
  Anesthesia Post-op Note  Patient: [name]  Procedure(s) Performed: Procedure(s) (LRB): THYROID LOBECTOMY (Left)  Patient Location: PACU  Anesthesia Type: General  Level of Consciousness: awake and alert   Airway and Oxygen Therapy: Patient Spontanous Breathing  Post-op Pain: mild  Post-op Assessment: Post-op Vital signs reviewed, Patient's Cardiovascular Status Stable, Respiratory Function Stable, Patent Airway and No signs of Nausea or vomiting  Last Vitals:  Filed Vitals:   10/01/13 1600  BP:   Pulse:   Temp: 36.7 C  Resp:     Post-op Vital Signs: stable   Complications: No apparent anesthesia complications

## 2013-10-01 NOTE — Preoperative (Signed)
Beta Blockers   Reason not to administer Beta Blockers:Not Applicable 

## 2013-10-01 NOTE — Anesthesia Preprocedure Evaluation (Addendum)
Anesthesia Evaluation  Patient identified by MRN, date of birth, ID band Patient awake    Reviewed: Allergy & Precautions, H&P , NPO status , Patient's Chart, lab work & pertinent test results  Airway Mallampati: II TM Distance: >3 FB Neck ROM: full    Dental no notable dental hx. (+) Teeth Intact and Dental Advisory Given   Pulmonary shortness of breath and with exertion, asthma , sleep apnea , Current Smoker,  Mild OSA breath sounds clear to auscultation  Pulmonary exam normal       Cardiovascular Exercise Tolerance: Good negative cardio ROS  Rhythm:regular Rate:Normal     Neuro/Psych Depression negative neurological ROS  negative psych ROS   GI/Hepatic negative GI ROS, Neg liver ROS,   Endo/Other  negative endocrine ROS  Renal/GU negative Renal ROS  negative genitourinary   Musculoskeletal   Abdominal   Peds  Hematology negative hematology ROS (+)   Anesthesia Other Findings   Reproductive/Obstetrics negative OB ROS                          Anesthesia Physical Anesthesia Plan  ASA: II  Anesthesia Plan: General   Post-op Pain Management:    Induction: Intravenous  Airway Management Planned: Oral ETT  Additional Equipment:   Intra-op Plan:   Post-operative Plan: Extubation in OR  Informed Consent: I have reviewed the patients History and Physical, chart, labs and discussed the procedure including the risks, benefits and alternatives for the proposed anesthesia with the patient or authorized representative who has indicated his/her understanding and acceptance.   Dental Advisory Given  Plan Discussed with: CRNA and Surgeon  Anesthesia Plan Comments:         Anesthesia Quick Evaluation

## 2013-10-02 LAB — BASIC METABOLIC PANEL
CO2: 26 mEq/L (ref 19–32)
Calcium: 8.1 mg/dL — ABNORMAL LOW (ref 8.4–10.5)
Creatinine, Ser: 0.94 mg/dL (ref 0.50–1.10)
GFR calc Af Amer: 89 mL/min — ABNORMAL LOW (ref >90)
GFR calc non Af Amer: 77 mL/min — ABNORMAL LOW (ref >90)

## 2013-10-02 MED ORDER — HYDROCODONE-ACETAMINOPHEN 7.5-325 MG/15ML PO SOLN: 10.0000 mL | ORAL | Status: DC | PRN

## 2013-10-02 MED ORDER — INFLUENZA VAC SPLIT QUAD 0.5 ML IM SUSP
0.5000 mL | INTRAMUSCULAR | Status: AC
  Administered 2013-10-02: 0.5 mL via INTRAMUSCULAR
  Filled 2013-10-02: qty 0.5

## 2013-10-02 MED ORDER — PNEUMOCOCCAL VAC POLYVALENT 25 MCG/0.5ML IJ INJ
0.5000 mL | INJECTION | INTRAMUSCULAR | Status: DC
  Filled 2013-10-02: qty 0.5

## 2013-10-02 NOTE — Progress Notes (Signed)
Pt c/o feeling dizzy when she got up and went to the bathroom. Took pt's bp and heart rate, they were WNL. Instructed pt to lie down on bed for a few minutes and see if she felt better. At this point she was in the process of being discharged.  About 15 min later, pt called me to the room and asked me if she could be discharged. I told her if she felt better and didn't feel dizzy when she got up she could go. Pt stated that she did feel a little better.  I then told pt she could stay as long as she needed to until she felt like she was going to be ok.   She insisted that she did feel better and would rest better if she was at home, so I proceeded to finish discharging pt.

## 2013-10-02 NOTE — Progress Notes (Signed)
Pt requested a pneumonia shot, even thought she stated that she had one years ago.  I advised pt that if she had one in the last few years she did not need it at this time.

## 2013-10-02 NOTE — Op Note (Signed)
NAMEKERRIGAN, GOMBOS NO.:  [id number]  MEDICAL RECORD NO.:  [id number]  LOCATION:  1528                         FACILITY:  [facility]  PHYSICIAN:  Lodema Pilot, MD       DATE OF BIRTH:  Jun 17, 1976  DATE OF PROCEDURE:  10/01/2013 DATE OF DISCHARGE:                              OPERATIVE REPORT   PROCEDURE:  Left thyroid lobectomy.  PREOPERATIVE DIAGNOSIS:  Thyroid mass.  POSTOPERATIVE DIAGNOSIS:  Thyroid mass.  SURGEON:  Lodema Pilot, MD  ASSISTANT:  Dr. Fredric Mare.  ANESTHESIA:  General endotracheal tube anesthesia.  FLUIDS:  1500 mL of crystalloid.  ESTIMATED BLOOD LOSS:  Minimal.  DRAINS:  None.  SPECIMENS:  Left thyroid lobe with a suture marking superior pole, sent to Pathology for permanent sectioning.  COMPLICATIONS:  None apparent.  FINDINGS:  Left thyroid lobe enlarged, the recurrent laryngeal nerve was clearly visualized, and superior parathyroid was visualized.  INDICATION FOR PROCEDURE:  Ms. Chaloux is a 37 year old female with a left thyroid mass.  FNA was concerning for follicular neoplasm and she desired thyroid lobectomy for definitive diagnosis and treatment.  OPERATIVE DETAILS:  Ms. Pippins was seen and evaluated in the preoperative area and risks and benefits of procedure were again discussed in lay terms.  Informed consent was obtained.  The left neck was marked prior to anesthetic administration.  She was given prophylactic antibiotics and taken to the operating room, and placed on table in a supine position.  Arms were tucked and shoulder roll was placed.  PROCEDURE IN DETAIL:  Her neck and chest were prepped and draped in a standard surgical fashion.  A 4-cm transverse incision was made in the neck and dissection carried down to subcutaneous tissue using Bovie electrocautery.  Subplatysmal flaps were elevated circumferentially and the strap muscles were divided in the midline to the thyroid tissue.  I dissected with blunt  dissection and Bovie electrocautery around the lateral aspect of the thyroid towards the carotid sheath.  The superior pole was isolated and Kelly clamp was placed on the superior pole of thyroid to bring this down into the wound.  I dissected around the superior pole vessels with a right-angle clamp, and a clip was placed on the vessels and the vessels were divided with Harmonic scalpel.  The superior parathyroid gland was identified and preserved with its pedicle and I continued to elevate the superior pole into the wound.  The inferior pole was already mobilized mostly with blunt dissection.  The middle thyroid vein was divided with hemoclips and Harmonic Scalpel close to the thyroid gland.  I divided these vessels away from the gland.  As we were able to identify the recurrent laryngeal nerve in its usual anatomic position, and I followed this nerve to where it turned medially and drove into the trachea.  I did not use any cautery in the area of the nerve.  With the nerve identified, I was able to dissect superficial to this and still staying close to the gland and elevate the thyroid gland over the trachea and I was able then to divide this in the midline, divide the isthmus with the Harmonic scalpel.  A suture was placed on  the superior pole of the gland and it was sent to Pathology for permanent sectioning and palpated the rest of the neck.  I did not feel any significant nodal disease.  I sent the specimen for pathology and then I irrigated the wound with sterile water and inspected for hemostasis, which was noted to be adequate.  I did had anesthesia hold the Valsalva breath looking for any venous bleeding and none was identified.  After the wound looked hemostatic, I have laid some Surgicel gauze in the neck and then approximated the strap muscles in the midline with figure-of-eight 3-0 Vicryl sutures.  The platysma muscle was approximated with interrupted 4-0 Vicryl sutures and  the skin edges were approximated with 5-0 Monocryl subcuticular suture.  The skin was washed and dried and benzoin and Steri-Strips were applied and sterile dressing was applied.  The patient tolerated the tolerated the procedure well without apparent complications.  She was stable and ready for transfer to the recovery room in stable condition.          ______________________________ Lodema Pilot, MD     BL/MEDQ  D:  10/01/2013  T:  10/02/2013  Job:  [id number]

## 2013-10-02 NOTE — Progress Notes (Signed)
1 Day Post-Op  Subjective: Pt c/pain, and her voice is sl hoarse.  She also complains of sore throat, and does not want to eat solid food.   Denies hand/foot cramps and perioral numbness.    Objective: Vital signs in last 24 hours: Temp:  [97.5 F (36.4 C)-98.8 F (37.1 C)] 97.9 F (36.6 C) (11/22 0530) Pulse Rate:  [66-90] 73 (11/22 0530) Resp:  [10-18] 18 (11/22 0530) BP: (103-142)/(70-92) 111/72 mmHg (11/22 0530) SpO2:  [95 %-100 %] 100 % (11/22 0530) Weight:  [156 lb (70.761 kg)] 156 lb (70.761 kg) (11/21 1630) Last BM Date: 10/01/13  Intake/Output from previous day: 11/21 0701 - 11/22 0700 In: 2100 [I.V.:2000; IV Piggyback:100] Out: 1150 [Urine:1100; Blood:50] Intake/Output this shift:    General appearance: alert, cooperative and no distress Neck: neck with minimal swelling.  large towel dressing place Extremities: extremities normal, atraumatic, no cyanosis or edema Voice sounds pretty good, just sl hoarse.    Lab Results:  No results found for this basename: WBC, HGB, HCT, PLT,  in the last 72 hours BMET  Recent Labs  10/01/13 1056 10/02/13 0503  NA 136 134*  K 3.9 4.2  CL 102 102  CO2 25 26  GLUCOSE 84 135*  BUN 10 10  CREATININE 0.99 0.94  CALCIUM 8.7 8.1*   PT/INR No results found for this basename: LABPROT, INR,  in the last 72 hours ABG No results found for this basename: PHART, PCO2, PO2, HCO3,  in the last 72 hours  Studies/Results: No results found.  Anti-infectives: Anti-infectives   Start     Dose/Rate Route Frequency Ordered Stop   10/01/13 1045  ceFAZolin (ANCEF) IVPB 2 g/50 mL premix     2 g 100 mL/hr over 30 Minutes Intravenous On call to O.R. 10/01/13 1031 10/01/13 1355      Assessment/Plan: s/p Procedure(s): THYROID LOBECTOMY (Left) Discharge Pt wants flu and pneumonia vaccines prior to d/c.    LOS: 1 day    [facility] 10/02/2013

## 2013-10-04 ENCOUNTER — Encounter (HOSPITAL_COMMUNITY): Payer: Self-pay | Admitting: General Surgery

## 2013-10-04 ENCOUNTER — Telehealth: Payer: Self-pay

## 2013-10-04 NOTE — Telephone Encounter (Signed)
Patient says she needs a referral to [facility] please call patient at [phone]

## 2013-10-04 NOTE — Telephone Encounter (Signed)
She is already a patient at [facility]. She indicates she needs a referral for follow up appt, she needs this with her [facility] insurance. Will you check on this for her?

## 2013-10-11 NOTE — Telephone Encounter (Signed)
Patient wants call regarding her pathology from her partial thyroidectomy

## 2013-10-11 NOTE — Telephone Encounter (Signed)
Pt is calling to get lab results Please call pt at [phone]

## 2013-10-11 NOTE — Telephone Encounter (Signed)
What labs? Left message for her to call me back.

## 2013-10-12 ENCOUNTER — Telehealth (INDEPENDENT_AMBULATORY_CARE_PROVIDER_SITE_OTHER): Payer: Self-pay | Admitting: General Surgery

## 2013-10-12 NOTE — Telephone Encounter (Signed)
Pt called for her path results.  Wants to know before her appt later this week.

## 2013-10-12 NOTE — Telephone Encounter (Signed)
I returned her call to discuss her path report.  No answer.  I left a message to call back or we can discuss the results at her appointment later this week.

## 2013-10-14 ENCOUNTER — Encounter (INDEPENDENT_AMBULATORY_CARE_PROVIDER_SITE_OTHER): Payer: Self-pay | Admitting: General Surgery

## 2013-10-14 ENCOUNTER — Ambulatory Visit (INDEPENDENT_AMBULATORY_CARE_PROVIDER_SITE_OTHER): Payer: Medicare Other | Admitting: General Surgery

## 2013-10-14 VITALS — BP 122/88 | HR 84 | Temp 97.1°F | Resp 16 | Ht 66.5 in | Wt 151.2 lb

## 2013-10-14 DIAGNOSIS — Z4889 Encounter for other specified surgical aftercare: Secondary | ICD-10-CM

## 2013-10-14 DIAGNOSIS — Z5189 Encounter for other specified aftercare: Secondary | ICD-10-CM

## 2013-10-14 DIAGNOSIS — E041 Nontoxic single thyroid nodule: Secondary | ICD-10-CM

## 2013-10-14 LAB — BASIC METABOLIC PANEL
BUN: 9 mg/dL (ref 6–23)
CO2: 27 mEq/L (ref 19–32)
Calcium: 9.2 mg/dL (ref 8.4–10.5)
Creat: 0.87 mg/dL (ref 0.50–1.10)
Sodium: 139 mEq/L (ref 135–145)

## 2013-10-14 LAB — TSH: TSH: 1.206 u[IU]/mL (ref 0.350–4.500)

## 2013-10-14 NOTE — Progress Notes (Signed)
Subjective:     Patient ID: [name], female   DOB: 1976/01/22, 37 y.o.   MRN: [id number]  HPI This patient follows up approximately 2 weeks status post left thyroid lobectomy.  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 She says that she does have some difficulty swallowing and that she has a hard time with breads and chicken.  It is hard to really elicit from her the problem of whether things just don't feel right lower things are actually getting stuck. She says that she is taking the pain medication but really doesn't have much pain associated with this. Her voice is normal currently she says that after she talks for a long time then her voice will fatigue.  Review of Systems     Objective:   Physical Exam No acute distress and nontoxic-appearing Her voice sounds normal and strong I do not appreciate any wheezing or hoarseness Her incision looks great without any sign of infection. I do not see any visible swelling. She has a slight healing ridge under incision but again, I do not see any evidence of hematoma or seroma or any obvious swelling. nontender to palpation. Because of her complaints of dysphasia, performed a bedside ultrasound of the neck and this looked very good. I did not see any significant fluid collection there was a trace amount of fluid in the area of the left thyroid but not a significant amount to attribute to her symptoms. 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 I also recommended that we check her thyroid labs and her calcium levels which we will order today. I think that her symptoms are not that unexpected this early postoperatively and I do not  see any evidence of any postoperative complication. I recommended that she continue with a soft diet as tolerated and gradually increase her diet as tolerated and that we follow her up in about 2 weeks to see how she is doing this with the dysphagia.  If it is still present at that time, then I would consider esophagram.  She knows that she will follow up with one of the other surgeons her due to my leaving.    Plan:     TSH and BMP Soft diet and return to the office in 2 weeks for repeat exam.  If still with dysphagia, consider esophagram.

## 2013-10-16 NOTE — Discharge Summary (Signed)
Physician Discharge Summary  Patient ID: [name] MRN: [id number] DOB/AGE: January 03, 1976 37 y.o.  Admit date: 10/01/2013 Discharge date: 10/16/2013  Admission Diagnoses: thyroid mass  Discharge Diagnoses: same Active Problems:   * No active hospital problems. *   Discharged Condition: stable  Hospital Course: to OR 10/01/13 for right thyroid lobectomy.  No apparent complications.  She was seen on POD 1 and wound looked fine without evidence of hematoma.  Diet advanced and she was stable for discharge to home on POD 1  Consults: None  Significant Diagnostic Studies: none   Treatments: surgery: right thyroid lobectomy 10/01/13  Disposition: 01-Home or Self Care  Discharge Orders   Future Orders Complete By Expires   Call MD for:  difficulty breathing, headache or visual disturbances  As directed    Call MD for:  persistant nausea and vomiting  As directed    Call MD for:  redness, tenderness, or signs of infection (pain, swelling, redness, odor or green/yellow discharge around incision site)  As directed    Call MD for:  severe uncontrolled pain  As directed    Call MD for:  temperature >100.4  As directed    Diet - low sodium heart healthy  As directed    Increase activity slowly  As directed        Medication List         albuterol 108 (90 BASE) MCG/ACT inhaler  Commonly known as:  PROVENTIL HFA;VENTOLIN HFA  Inhale 2 puffs into the lungs every 6 (six) hours as needed for wheezing.     beclomethasone 40 MCG/ACT inhaler  Commonly known as:  QVAR  Inhale 2 puffs into the lungs 2 (two) times daily. For 2 months, then may discontinue if wheezing resolved     BIOTIN PO  Take 1 tablet by mouth daily.     DULoxetine 20 MG capsule  Commonly known as:  CYMBALTA  Take 20 mg by mouth 2 (two) times daily.     HYDROcodone-acetaminophen 7.5-325 mg/15 ml solution  Commonly known as:  HYCET  Take 10-15 mLs by mouth every 4 (four) hours as needed for moderate pain.     hydrOXYzine 50 MG tablet  Commonly known as:  ATARAX/VISTARIL  Take 50 mg by mouth 3 (three) times daily as needed.     ibuprofen 800 MG tablet  Commonly known as:  ADVIL,MOTRIN  Take 800 mg by mouth every 8 (eight) hours as needed (mild pain).     oxyCODONE-acetaminophen 5-325 MG per tablet  Commonly known as:  PERCOCET/ROXICET  Take 1-2 tablets by mouth every 6 (six) hours as needed for moderate pain or severe pain.     traZODone 100 MG tablet  Commonly known as:  DESYREL  Take 100-200 mg by mouth at bedtime.         SignedLodema Pilot DAVID 10/16/2013, 1:35 PM

## 2013-10-18 ENCOUNTER — Encounter (INDEPENDENT_AMBULATORY_CARE_PROVIDER_SITE_OTHER): Payer: Self-pay | Admitting: General Surgery

## 2013-10-18 ENCOUNTER — Ambulatory Visit (INDEPENDENT_AMBULATORY_CARE_PROVIDER_SITE_OTHER): Payer: Medicare Other | Admitting: General Surgery

## 2013-10-18 VITALS — BP 142/80 | HR 92 | Temp 97.0°F | Resp 20 | Ht 66.5 in | Wt 152.0 lb

## 2013-10-18 DIAGNOSIS — T8140XA Infection following a procedure, unspecified, initial encounter: Secondary | ICD-10-CM

## 2013-10-18 DIAGNOSIS — T8149XA Infection following a procedure, other surgical site, initial encounter: Secondary | ICD-10-CM | POA: Insufficient documentation

## 2013-10-18 MED ORDER — DOXYCYCLINE HYCLATE 50 MG PO CAPS: 100.0000 mg | ORAL_CAPSULE | Freq: Two times a day (BID) | ORAL | Status: DC

## 2013-10-18 MED ORDER — HYDROCODONE-ACETAMINOPHEN 7.5-325 MG/15ML PO SOLN: 10.0000 mL | ORAL | Status: DC | PRN

## 2013-10-18 NOTE — Addendum Note (Signed)
Addended byLiliana Cline on: 10/18/2013 05:54 PM   Modules accepted: Orders

## 2013-10-18 NOTE — Progress Notes (Signed)
The patient comes in with an infected thyroidectomy wound. Over the past 24-48 hours it has been draining purulent fluid. On examination today there is a small opening in the right side of the wound with cloudy serosanguineous fluid draining from it. The wound was fluctuant. It is very tender in the patient's having difficulty swallowing.  After Betadine prep and local anesthesia being applied to the wound wasn't opened a very small area on the right side of the wound were approximately 10 cc of cloudy but clear fluid with some particulate black particles draining from the wound. It appeared as though there was some necrotic debris draining from the wound. Cultures were taken and sent.  I place the patient on doxycycline 100 mg by mouth twice a day for the next 10 days. I would have her see her come back to see me in approximately 3 weeks but she'll come back for wound change on Wednesday and again on Friday. At that time if she is worsening we can get her back into see someone in our office sooner. The primary surgeon was unavailable.  In addition to the doxycycline I did give the patient and I rewrote her a refill on her pain medication solution.

## 2013-10-19 ENCOUNTER — Telehealth (INDEPENDENT_AMBULATORY_CARE_PROVIDER_SITE_OTHER): Payer: Self-pay

## 2013-10-19 NOTE — Telephone Encounter (Signed)
Pt made aware of two nurse only appts, 10/20/13 at 10 o'clock am, and 10/22/13 at 10:20 a.m.  At Friday's appt she will be assesed for f/u with Dr. Lindie Spruce.  She was reminded to p/u her Rx for liquid doxycycline at the pharmacy and begin taking asap.  Pt. Understood and agreed to p/u today.

## 2013-10-20 ENCOUNTER — Ambulatory Visit (INDEPENDENT_AMBULATORY_CARE_PROVIDER_SITE_OTHER): Payer: Medicare Other

## 2013-10-20 ENCOUNTER — Encounter (INDEPENDENT_AMBULATORY_CARE_PROVIDER_SITE_OTHER): Payer: Self-pay

## 2013-10-20 VITALS — BP 130/86 | Temp 97.0°F | Resp 16 | Wt 148.6 lb

## 2013-10-20 DIAGNOSIS — Z4801 Encounter for change or removal of surgical wound dressing: Secondary | ICD-10-CM

## 2013-10-20 NOTE — Progress Notes (Signed)
Patient comes into office today for wound check and repacking.  Patient s/p Left Thyroid Lobectomy on 10/01/13.  Patient incision appears to be healing well without redness, swelling or drainage.  Removed gauze and repacked wound with 1/4 inch Iodaform.  Plalced dry gauze over wound.  Patient tolerated well.  Patient will come back on Friday 10/22/13 for wound check.

## 2013-10-22 ENCOUNTER — Ambulatory Visit (INDEPENDENT_AMBULATORY_CARE_PROVIDER_SITE_OTHER): Payer: Medicare Other

## 2013-10-22 ENCOUNTER — Encounter (INDEPENDENT_AMBULATORY_CARE_PROVIDER_SITE_OTHER): Payer: Self-pay

## 2013-10-22 VITALS — BP 112/84 | HR 80 | Temp 97.5°F | Resp 16 | Wt 150.8 lb

## 2013-10-22 DIAGNOSIS — Z4801 Encounter for change or removal of surgical wound dressing: Secondary | ICD-10-CM

## 2013-10-22 LAB — WOUND CULTURE

## 2013-10-22 NOTE — Progress Notes (Signed)
Patient comes in to office today for wound check and dressing change.  Patient incision appears to be healing well, patient is still having thick yellowish drainage.  Ask Dr. Jamey Ripa to come in and assess wound, silver nitrate was placed in wound.  Instructed to proceed with packing wound with 1/4 inch Iodaform.  Proceeded with 1/4 inch Iodaform gauze in wound and placed dry 4x4 over wound.  Patient to continue taking her antibiotics as directed by physician.  Patient scheduled for recheck/follow up appointment on [id number] w/Dr. Lindie Spruce.  Patient advised to call our office if she has any questions or concerns over the weekend.  Patient verbalized understanding.

## 2014-03-30 ENCOUNTER — Ambulatory Visit: Payer: Medicare Other | Admitting: Cardiovascular Disease

## 2014-04-07 ENCOUNTER — Ambulatory Visit: Payer: Medicare Other | Admitting: Cardiovascular Disease

## 2014-04-12 ENCOUNTER — Ambulatory Visit (INDEPENDENT_AMBULATORY_CARE_PROVIDER_SITE_OTHER): Payer: Medicare Other | Admitting: Cardiovascular Disease

## 2014-04-12 ENCOUNTER — Encounter: Payer: Self-pay | Admitting: Cardiovascular Disease

## 2014-04-12 VITALS — BP 133/94 | HR 72 | Ht 66.5 in | Wt 156.7 lb

## 2014-04-12 DIAGNOSIS — Z01818 Encounter for other preprocedural examination: Secondary | ICD-10-CM

## 2014-04-12 NOTE — Patient Instructions (Signed)
Your physician has requested that you have a lower extremity arterial duplex. This test is an ultrasound of the arteries in the legs. It looks at arterial blood flow in the legs. Allow one hour for LowerArterial scans. There are no restrictions or special instructions.  Your physician recommends that you schedule a follow-up appointment as needed.

## 2014-04-12 NOTE — Progress Notes (Signed)
04/12/2014 Jamesetta So   27-Nov-1975  [id number]  Primary Physician DOOLITTLE, Linton Ham, MD Primary Cardiologist: Lorretta Harp MD Renae Gloss   HPI:  Mrs. Ford is a 38 year old single African American female mother of one child who currently does not work. She was referred by Dr. Melony Overly  for preoperative clearance before podiatry surgery after Dopplers in your office suggested abnormal circulation. The patient's risk factor profile is remarkable for tobacco abuse otherwise negative. She's never had a heart attack or stroke, denies chest pain, shortness of breath or claudication. We shall urgently Dopplers showed normal ABIs with monophasic waveforms. She scheduled a bunionectomy on the right foot.   Current Outpatient Prescriptions  Medication Sig Dispense Refill  . albuterol (PROVENTIL HFA;VENTOLIN HFA) 108 (90 BASE) MCG/ACT inhaler Inhale 2 puffs into the lungs every 6 (six) hours as needed for wheezing.      . beclomethasone (QVAR) 40 MCG/ACT inhaler Inhale 2 puffs into the lungs 2 (two) times daily. For 2 months, then may discontinue if wheezing resolved      . BIOTIN PO Take 1 tablet by mouth daily.      Marland Kitchen doxycycline (VIBRAMYCIN) 50 MG capsule Take 2 capsules (100 mg total) by mouth 2 (two) times daily.  40 capsule  0  . DULoxetine (CYMBALTA) 20 MG capsule Take 20 mg by mouth 2 (two) times daily.      Marland Kitchen HYDROcodone-acetaminophen (HYCET) 7.5-325 mg/15 ml solution Take 10-15 mLs by mouth every 4 (four) hours as needed for moderate pain.  473 mL  0  . hydrOXYzine (ATARAX/VISTARIL) 50 MG tablet Take 50 mg by mouth 3 (three) times daily as needed.       Marland Kitchen ibuprofen (ADVIL,MOTRIN) 800 MG tablet Take 800 mg by mouth every 8 (eight) hours as needed (mild pain).      Marland Kitchen oxyCODONE-acetaminophen (PERCOCET/ROXICET) 5-325 MG per tablet Take 1-2 tablets by mouth every 6 (six) hours as needed for moderate pain or severe pain.      . Prenatal Vit-Fe Fumarate-FA (PRENATAL  MULTIVITAMIN) TABS tablet Take 1 tablet by mouth daily at 12 noon.      . traZODone (DESYREL) 100 MG tablet Take 100-200 mg by mouth at bedtime.        No current facility-administered medications for this visit.    Allergies  Allergen Reactions  . Shrimp [Shellfish Allergy] Swelling    History   Social History  . Marital Status: Single    Spouse Name: N/A    Number of Children: N/A  . Years of Education: N/A   Occupational History  . Not on file.   Social History Main Topics  . Smoking status: Current Some Day Smoker -- 0.25 packs/day    Types: Cigarettes  . Smokeless tobacco: Not on file     Comment: 1 pack will last a week and a half  . Alcohol Use: No  . Drug Use: No  . Sexual Activity: No   Other Topics Concern  . Not on file   Social History Narrative  . No narrative on file     Review of Systems: General: negative for chills, fever, night sweats or weight changes.  Cardiovascular: negative for chest pain, dyspnea on exertion, edema, orthopnea, palpitations, paroxysmal nocturnal dyspnea or shortness of breath Dermatological: negative for rash Respiratory: negative for cough or wheezing Urologic: negative for hematuria Abdominal: negative for nausea, vomiting, diarrhea, bright red blood per rectum, melena, or hematemesis Neurologic: negative for visual changes, syncope, or  dizziness All other systems reviewed and are otherwise negative except as noted above.    Blood pressure 133/94, pulse 72, height 5' 6.5" (1.689 m), weight 156 lb 11.2 oz (71.079 kg), last menstrual period 01/07/2013.  General appearance: alert and no distress Neck: no adenopathy, no carotid bruit, no JVD, supple, symmetrical, trachea midline and thyroid not enlarged, symmetric, no tenderness/mass/nodules Lungs: clear to auscultation bilaterally Heart: regular rate and rhythm, S1, S2 normal, no murmur, click, rub or gallop Extremities: extremities normal, atraumatic, no cyanosis or  edema  EKG sinus rhythm at 72 with no ST or T wave changes  ASSESSMENT AND PLAN:   Preoperative clearance Patient was referred to me for preoperative clearance for surgery on her right foot. She probably needs a bunionectomy. She had Dopplers which were recently performed revealed normal ABIs and TBIs however her waveforms were monophasic. She denies claudication. She has 2+ pedal pulses. I'm going to repeat her lower extremity arterial Doppler studies prior to clearing her for upcoming surgery      Lorretta Harp MD [facility], [facility] 04/12/2014 1:54 PM

## 2014-04-12 NOTE — Assessment & Plan Note (Signed)
Patient was referred to me for preoperative clearance for surgery on her right foot. She probably needs a bunionectomy. She had Dopplers which were recently performed revealed normal ABIs and TBIs however her waveforms were monophasic. She denies claudication. She has 2+ pedal pulses. I'm going to repeat her lower extremity arterial Doppler studies prior to clearing her for upcoming surgery

## 2014-04-13 ENCOUNTER — Ambulatory Visit (HOSPITAL_COMMUNITY)
Admission: RE | Admit: 2014-04-13 | Discharge: 2014-04-13 | Disposition: A | Discharge status: Home / Self Care | Payer: Medicare Other | Source: Ambulatory Visit | Attending: Cardiovascular Disease | Admitting: Cardiovascular Disease

## 2014-04-13 DIAGNOSIS — Z01818 Encounter for other preprocedural examination: Secondary | ICD-10-CM | POA: Diagnosis present

## 2014-04-13 DIAGNOSIS — Z01810 Encounter for preprocedural cardiovascular examination: Secondary | ICD-10-CM

## 2014-04-13 NOTE — Progress Notes (Signed)
Right Lower Extremity Arterial Duplex Completed. °Brianna L Mazza,RVT °

## 2014-04-14 ENCOUNTER — Encounter: Payer: Self-pay | Admitting: Cardiovascular Disease

## 2014-04-18 ENCOUNTER — Telehealth: Payer: Self-pay | Admitting: Cardiovascular Disease

## 2014-04-18 NOTE — Telephone Encounter (Signed)
Patient had testing done last week---would like results.

## 2014-04-18 NOTE — Telephone Encounter (Signed)
What tests were done. I've resulted everything in my in basket  JJB

## 2014-04-18 NOTE — Telephone Encounter (Signed)
Called voice mail full Defer to Dr Clyda Greener RN

## 2014-04-19 ENCOUNTER — Telehealth: Payer: Self-pay | Admitting: Cardiovascular Disease

## 2014-04-19 NOTE — Telephone Encounter (Signed)
Returning call to get results from test .. Please call   Thanks

## 2014-04-19 NOTE — Telephone Encounter (Signed)
Patient had lower arterial doppler on 04/13/14 She was pre op clearance -rgt foot bunion surgery

## 2014-04-19 NOTE — Telephone Encounter (Signed)
This test is note results in epic at this time. Will defer to Dr. Cecil Cobbs, RN to review.

## 2014-04-21 ENCOUNTER — Telehealth: Payer: Self-pay | Admitting: Cardiovascular Disease

## 2014-04-21 ENCOUNTER — Encounter: Payer: Self-pay | Admitting: *Deleted

## 2014-04-21 NOTE — Telephone Encounter (Signed)
No answer.  The mailbox is full and cannot receive a message

## 2014-04-21 NOTE — Telephone Encounter (Signed)
Please see additional phone message

## 2014-04-21 NOTE — Telephone Encounter (Signed)
Patient notified of results.

## 2014-04-21 NOTE — Telephone Encounter (Signed)
Calling to inquire about her test results .Marland Kitchen Please call   Thanks

## 2014-04-21 NOTE — Telephone Encounter (Signed)
Per Dr Gwenlyn Found, the doppler was normal and she is cleared for surgery

## 2014-04-21 NOTE — Telephone Encounter (Signed)
Forward to [employer] patient

## 2014-04-21 NOTE — Telephone Encounter (Signed)
Spoke with patient.  Letter was sent to Dr Melony Overly

## 2014-10-14 ENCOUNTER — Observation Stay (HOSPITAL_COMMUNITY): Payer: Medicare Other

## 2014-10-14 ENCOUNTER — Emergency Department (HOSPITAL_COMMUNITY): Payer: Medicare Other

## 2014-10-14 ENCOUNTER — Encounter (HOSPITAL_COMMUNITY): Payer: Self-pay | Admitting: Emergency Medicine

## 2014-10-14 ENCOUNTER — Observation Stay (HOSPITAL_COMMUNITY): Payer: Medicare Other | Admitting: Certified Registered"

## 2014-10-14 ENCOUNTER — Encounter (HOSPITAL_COMMUNITY)
Admission: EM | Disposition: A | Discharge status: Home Health | Payer: Self-pay | Source: Home / Self Care | Attending: Orthopedic Surgery

## 2014-10-14 ENCOUNTER — Inpatient Hospital Stay (HOSPITAL_COMMUNITY)
Admission: EM | Admit: 2014-10-14 | Discharge: 2014-10-24 | DRG: 505 | Disposition: A | Discharge status: Home Health | Payer: Medicare Other | Attending: Orthopedic Surgery | Admitting: Orthopedic Surgery

## 2014-10-14 DIAGNOSIS — F17210 Nicotine dependence, cigarettes, uncomplicated: Secondary | ICD-10-CM | POA: Diagnosis present

## 2014-10-14 DIAGNOSIS — W11XXXA Fall on and from ladder, initial encounter: Secondary | ICD-10-CM | POA: Diagnosis present

## 2014-10-14 DIAGNOSIS — S92109A Unspecified fracture of unspecified talus, initial encounter for closed fracture: Secondary | ICD-10-CM

## 2014-10-14 DIAGNOSIS — S92142B Displaced dome fracture of left talus, initial encounter for open fracture: Secondary | ICD-10-CM | POA: Diagnosis present

## 2014-10-14 DIAGNOSIS — W19XXXD Unspecified fall, subsequent encounter: Secondary | ICD-10-CM

## 2014-10-14 DIAGNOSIS — J45909 Unspecified asthma, uncomplicated: Secondary | ICD-10-CM | POA: Diagnosis present

## 2014-10-14 DIAGNOSIS — Z8781 Personal history of (healed) traumatic fracture: Secondary | ICD-10-CM

## 2014-10-14 DIAGNOSIS — G4730 Sleep apnea, unspecified: Secondary | ICD-10-CM | POA: Diagnosis present

## 2014-10-14 DIAGNOSIS — T148XXA Other injury of unspecified body region, initial encounter: Secondary | ICD-10-CM

## 2014-10-14 DIAGNOSIS — W19XXXA Unspecified fall, initial encounter: Secondary | ICD-10-CM

## 2014-10-14 DIAGNOSIS — Z9889 Other specified postprocedural states: Secondary | ICD-10-CM

## 2014-10-14 DIAGNOSIS — T148 Other injury of unspecified body region: Secondary | ICD-10-CM | POA: Diagnosis present

## 2014-10-14 HISTORY — PX: I & D EXTREMITY: SHX5045

## 2014-10-14 HISTORY — PX: EXTERNAL FIXATION LEG: SHX1549

## 2014-10-14 LAB — CBC WITH DIFFERENTIAL/PLATELET
Basophils Absolute: 0 10*3/uL (ref 0.0–0.1)
Basophils Relative: 1 % (ref 0–1)
Eosinophils Absolute: 0.1 10*3/uL (ref 0.0–0.7)
Eosinophils Relative: 2 % (ref 0–5)
HCT: 36.1 % (ref 36.0–46.0)
HEMOGLOBIN: 12 g/dL (ref 12.0–15.0)
Lymphocytes Relative: 21 % (ref 12–46)
Lymphs Abs: 1.1 10*3/uL (ref 0.7–4.0)
MCH: 28.8 pg (ref 26.0–34.0)
MCHC: 33.2 g/dL (ref 30.0–36.0)
MCV: 86.8 fL (ref 78.0–100.0)
MONO ABS: 0.4 10*3/uL (ref 0.1–1.0)
MONOS PCT: 8 % (ref 3–12)
NEUTROS ABS: 3.6 10*3/uL (ref 1.7–7.7)
Neutrophils Relative %: 68 % (ref 43–77)
Platelets: 181 10*3/uL (ref 150–400)
RBC: 4.16 MIL/uL (ref 3.87–5.11)
RDW: 13 % (ref 11.5–15.5)
WBC: 5.2 10*3/uL (ref 4.0–10.5)

## 2014-10-14 LAB — URINALYSIS, ROUTINE W REFLEX MICROSCOPIC
Glucose, UA: NEGATIVE mg/dL
Hgb urine dipstick: NEGATIVE
Ketones, ur: 15 mg/dL — AB
LEUKOCYTES UA: NEGATIVE
Nitrite: NEGATIVE
PROTEIN: NEGATIVE mg/dL
Specific Gravity, Urine: 1.024 (ref 1.005–1.030)
Urobilinogen, UA: 1 mg/dL (ref 0.0–1.0)
pH: 5.5 (ref 5.0–8.0)

## 2014-10-14 LAB — BASIC METABOLIC PANEL
ANION GAP: 10 (ref 5–15)
BUN: 8 mg/dL (ref 6–23)
CO2: 27 meq/L (ref 19–32)
CREATININE: 0.9 mg/dL (ref 0.50–1.10)
Calcium: 8.8 mg/dL (ref 8.4–10.5)
Chloride: 101 mEq/L (ref 96–112)
GFR calc Af Amer: >90 mL/min (ref >90)
GFR calc non Af Amer: 80 mL/min — ABNORMAL LOW (ref >90)
Glucose, Bld: 94 mg/dL (ref 70–99)
Potassium: 4.1 mEq/L (ref 3.7–5.3)
Sodium: 138 mEq/L (ref 137–147)

## 2014-10-14 LAB — PROTIME-INR
INR: 1.01 (ref 0.00–1.49)
Prothrombin Time: 13.4 seconds (ref 11.6–15.2)

## 2014-10-14 SURGERY — IRRIGATION AND DEBRIDEMENT EXTREMITY
Anesthesia: General | Site: Leg Lower | Laterality: Left

## 2014-10-14 MED ORDER — ROCURONIUM BROMIDE 100 MG/10ML IV SOLN
INTRAVENOUS | Status: DC | PRN
  Administered 2014-10-14: 20 mg via INTRAVENOUS

## 2014-10-14 MED ORDER — HYDROCODONE-ACETAMINOPHEN 5-325 MG PO TABS
1.0000 | ORAL_TABLET | ORAL | Status: DC | PRN
  Administered 2014-10-15 – 2014-10-17 (×2): 1 via ORAL
  Administered 2014-10-19 – 2014-10-23 (×4): 2 via ORAL
  Filled 2014-10-14 (×5): qty 2

## 2014-10-14 MED ORDER — CEFAZOLIN SODIUM-DEXTROSE 2-3 GM-% IV SOLR
2.0000 g | Freq: Four times a day (QID) | INTRAVENOUS | Status: DC
  Administered 2014-10-15 – 2014-10-24 (×35): 2 g via INTRAVENOUS
  Filled 2014-10-14 (×44): qty 50

## 2014-10-14 MED ORDER — ONDANSETRON HCL 4 MG/2ML IJ SOLN
INTRAMUSCULAR | Status: DC | PRN
  Administered 2014-10-14: 4 mg via INTRAVENOUS

## 2014-10-14 MED ORDER — ALBUTEROL SULFATE HFA 108 (90 BASE) MCG/ACT IN AERS: 2.0000 | INHALATION_SPRAY | Freq: Four times a day (QID) | RESPIRATORY_TRACT | Status: DC | PRN

## 2014-10-14 MED ORDER — SENNA 8.6 MG PO TABS
1.0000 | ORAL_TABLET | Freq: Two times a day (BID) | ORAL | Status: DC
  Filled 2014-10-14 (×2): qty 1

## 2014-10-14 MED ORDER — ONDANSETRON HCL 4 MG PO TABS
4.0000 mg | ORAL_TABLET | Freq: Four times a day (QID) | ORAL | Status: DC | PRN
  Filled 2014-10-14: qty 1

## 2014-10-14 MED ORDER — DIPHENHYDRAMINE HCL 12.5 MG/5ML PO ELIX: 25.0000 mg | ORAL_SOLUTION | ORAL | Status: DC | PRN

## 2014-10-14 MED ORDER — POLYETHYLENE GLYCOL 3350 17 G PO PACK
17.0000 g | PACK | Freq: Every day | ORAL | Status: DC | PRN
  Administered 2014-10-20 – 2014-10-21 (×2): 17 g via ORAL
  Filled 2014-10-14 (×2): qty 1

## 2014-10-14 MED ORDER — CEFAZOLIN SODIUM-DEXTROSE 2-3 GM-% IV SOLR
2.0000 g | Freq: Once | INTRAVENOUS | Status: AC
  Administered 2014-10-14: 2 g via INTRAVENOUS

## 2014-10-14 MED ORDER — HYDROMORPHONE HCL 1 MG/ML IJ SOLN
0.2500 mg | INTRAMUSCULAR | Status: DC | PRN
  Administered 2014-10-14 (×2): 0.5 mg via INTRAVENOUS

## 2014-10-14 MED ORDER — POLYETHYLENE GLYCOL 3350 17 G PO PACK: 17.0000 g | PACK | Freq: Every day | ORAL | Status: DC | PRN

## 2014-10-14 MED ORDER — MIDAZOLAM HCL 5 MG/5ML IJ SOLN
INTRAMUSCULAR | Status: DC | PRN
  Administered 2014-10-14: 1 mg via INTRAVENOUS

## 2014-10-14 MED ORDER — SORBITOL 70 % SOLN
30.0000 mL | Freq: Every day | Status: DC | PRN
  Administered 2014-10-21: 30 mL via ORAL
  Filled 2014-10-14 (×2): qty 30

## 2014-10-14 MED ORDER — BECLOMETHASONE DIPROPIONATE 40 MCG/ACT IN AERS
2.0000 | INHALATION_SPRAY | Freq: Two times a day (BID) | RESPIRATORY_TRACT | Status: DC
  Administered 2014-10-15 – 2014-10-24 (×17): 2 via RESPIRATORY_TRACT
  Filled 2014-10-14 (×3): qty 8.7

## 2014-10-14 MED ORDER — ALUM & MAG HYDROXIDE-SIMETH 200-200-20 MG/5ML PO SUSP: 30.0000 mL | Freq: Four times a day (QID) | ORAL | Status: DC | PRN

## 2014-10-14 MED ORDER — HYDROXYZINE HCL 25 MG PO TABS
50.0000 mg | ORAL_TABLET | Freq: Three times a day (TID) | ORAL | Status: DC | PRN
  Administered 2014-10-17 – 2014-10-20 (×3): 50 mg via ORAL
  Filled 2014-10-14 (×3): qty 2

## 2014-10-14 MED ORDER — MORPHINE SULFATE 2 MG/ML IJ SOLN: 1.0000 mg | INTRAMUSCULAR | Status: DC | PRN

## 2014-10-14 MED ORDER — DULOXETINE HCL 20 MG PO CPEP
20.0000 mg | ORAL_CAPSULE | Freq: Two times a day (BID) | ORAL | Status: DC
  Administered 2014-10-15 – 2014-10-23 (×19): 20 mg via ORAL
  Filled 2014-10-14 (×22): qty 1

## 2014-10-14 MED ORDER — MORPHINE SULFATE 2 MG/ML IJ SOLN
1.0000 mg | INTRAMUSCULAR | Status: DC | PRN
  Administered 2014-10-21: 1 mg via INTRAVENOUS
  Filled 2014-10-14: qty 1

## 2014-10-14 MED ORDER — SENNA 8.6 MG PO TABS
1.0000 | ORAL_TABLET | Freq: Two times a day (BID) | ORAL | Status: DC
  Administered 2014-10-15 – 2014-10-23 (×19): 8.6 mg via ORAL
  Filled 2014-10-14 (×22): qty 1

## 2014-10-14 MED ORDER — FENTANYL CITRATE 0.05 MG/ML IJ SOLN
INTRAMUSCULAR | Status: DC | PRN
  Administered 2014-10-14 (×5): 25 ug via INTRAVENOUS

## 2014-10-14 MED ORDER — PROMETHAZINE HCL 25 MG/ML IJ SOLN: 6.2500 mg | INTRAMUSCULAR | Status: DC | PRN

## 2014-10-14 MED ORDER — OXYCODONE HCL 5 MG PO TABS: 5.0000 mg | ORAL_TABLET | Freq: Once | ORAL | Status: DC | PRN

## 2014-10-14 MED ORDER — ENOXAPARIN SODIUM 40 MG/0.4ML ~~LOC~~ SOLN
40.0000 mg | SUBCUTANEOUS | Status: AC
  Administered 2014-10-15 – 2014-10-17 (×3): 40 mg via SUBCUTANEOUS
  Filled 2014-10-14 (×3): qty 0.4

## 2014-10-14 MED ORDER — SODIUM CHLORIDE 0.9 % IV SOLN
INTRAVENOUS | Status: DC
  Administered 2014-10-14 – 2014-10-15 (×3): via INTRAVENOUS
  Administered 2014-10-19: 125 mL/h via INTRAVENOUS

## 2014-10-14 MED ORDER — OXYCODONE HCL 5 MG/5ML PO SOLN: 5.0000 mg | Freq: Once | ORAL | Status: DC | PRN

## 2014-10-14 MED ORDER — ONDANSETRON HCL 4 MG/2ML IJ SOLN
4.0000 mg | Freq: Four times a day (QID) | INTRAMUSCULAR | Status: DC | PRN
Start: 2014-10-14 — End: 2014-10-15

## 2014-10-14 MED ORDER — ALBUTEROL SULFATE (2.5 MG/3ML) 0.083% IN NEBU: 2.5000 mg | INHALATION_SOLUTION | Freq: Four times a day (QID) | RESPIRATORY_TRACT | Status: DC | PRN

## 2014-10-14 MED ORDER — METOCLOPRAMIDE HCL 10 MG PO TABS: 5.0000 mg | ORAL_TABLET | Freq: Three times a day (TID) | ORAL | Status: DC | PRN

## 2014-10-14 MED ORDER — LIDOCAINE HCL (CARDIAC) 20 MG/ML IV SOLN
INTRAVENOUS | Status: DC | PRN
  Administered 2014-10-14: 70 mg via INTRAVENOUS

## 2014-10-14 MED ORDER — SUCCINYLCHOLINE CHLORIDE 20 MG/ML IJ SOLN
INTRAMUSCULAR | Status: DC | PRN
Start: 2014-10-14 — End: 2014-10-14
  Administered 2014-10-14: 100 mg via INTRAVENOUS

## 2014-10-14 MED ORDER — LACTATED RINGERS IV SOLN
INTRAVENOUS | Status: DC | PRN
  Administered 2014-10-14: 20:00:00 via INTRAVENOUS

## 2014-10-14 MED ORDER — NEOSTIGMINE METHYLSULFATE 10 MG/10ML IV SOLN
INTRAVENOUS | Status: DC | PRN
  Administered 2014-10-14: 2 mg via INTRAVENOUS

## 2014-10-14 MED ORDER — ACETAMINOPHEN 325 MG PO TABS: 650.0000 mg | ORAL_TABLET | Freq: Four times a day (QID) | ORAL | Status: DC | PRN

## 2014-10-14 MED ORDER — OXYCODONE HCL 5 MG PO TABS
5.0000 mg | ORAL_TABLET | ORAL | Status: DC | PRN
  Administered 2014-10-15 (×3): 10 mg via ORAL
  Administered 2014-10-15: 5 mg via ORAL
  Administered 2014-10-15 – 2014-10-16 (×2): 10 mg via ORAL
  Administered 2014-10-16: 15 mg via ORAL
  Administered 2014-10-16 (×2): 10 mg via ORAL
  Administered 2014-10-16 – 2014-10-17 (×3): 15 mg via ORAL
  Administered 2014-10-18: 10 mg via ORAL
  Administered 2014-10-18: 15 mg via ORAL
  Administered 2014-10-19 – 2014-10-20 (×2): 10 mg via ORAL
  Administered 2014-10-20 – 2014-10-21 (×4): 15 mg via ORAL
  Administered 2014-10-21: 10 mg via ORAL
  Administered 2014-10-21 (×2): 15 mg via ORAL
  Filled 2014-10-14: qty 3
  Filled 2014-10-14: qty 2
  Filled 2014-10-14: qty 3
  Filled 2014-10-14: qty 2
  Filled 2014-10-14: qty 3
  Filled 2014-10-14: qty 2
  Filled 2014-10-14 (×3): qty 3
  Filled 2014-10-14: qty 2
  Filled 2014-10-14: qty 3
  Filled 2014-10-14: qty 2
  Filled 2014-10-14: qty 3
  Filled 2014-10-14 (×2): qty 2
  Filled 2014-10-14: qty 3
  Filled 2014-10-14: qty 2
  Filled 2014-10-14: qty 3
  Filled 2014-10-14: qty 2
  Filled 2014-10-14 (×2): qty 3

## 2014-10-14 MED ORDER — PROPOFOL 10 MG/ML IV BOLUS
INTRAVENOUS | Status: DC | PRN
  Administered 2014-10-14: 50 mg via INTRAVENOUS
  Administered 2014-10-14: 100 mg via INTRAVENOUS

## 2014-10-14 MED ORDER — SODIUM CHLORIDE 0.9 % IR SOLN
Status: DC | PRN
  Administered 2014-10-14: 6000 mL

## 2014-10-14 MED ORDER — ONDANSETRON HCL 4 MG/2ML IJ SOLN
4.0000 mg | Freq: Once | INTRAMUSCULAR | Status: AC
  Administered 2014-10-14: 4 mg via INTRAVENOUS

## 2014-10-14 MED ORDER — HYDROMORPHONE HCL 1 MG/ML IJ SOLN
1.0000 mg | INTRAMUSCULAR | Status: DC | PRN
  Administered 2014-10-14 – 2014-10-15 (×4): 1 mg via INTRAVENOUS

## 2014-10-14 MED ORDER — SORBITOL 70 % SOLN: 30.0000 mL | Freq: Every day | Status: DC | PRN

## 2014-10-14 MED ORDER — ALBUTEROL SULFATE HFA 108 (90 BASE) MCG/ACT IN AERS
INHALATION_SPRAY | RESPIRATORY_TRACT | Status: DC | PRN
  Administered 2014-10-14: 8 via RESPIRATORY_TRACT

## 2014-10-14 MED ORDER — MAGNESIUM CITRATE PO SOLN: 1.0000 | Freq: Once | ORAL | Status: AC | PRN

## 2014-10-14 MED ORDER — HYDROMORPHONE HCL 1 MG/ML IJ SOLN
1.0000 mg | Freq: Once | INTRAMUSCULAR | Status: AC
  Administered 2014-10-14: 1 mg via INTRAVENOUS

## 2014-10-14 MED ORDER — TETANUS-DIPHTH-ACELL PERTUSSIS 5-2.5-18.5 LF-MCG/0.5 IM SUSP
0.5000 mL | Freq: Once | INTRAMUSCULAR | Status: AC
  Administered 2014-10-14: 0.5 mL via INTRAMUSCULAR

## 2014-10-14 MED ORDER — IBUPROFEN 800 MG PO TABS
800.0000 mg | ORAL_TABLET | Freq: Three times a day (TID) | ORAL | Status: DC | PRN
  Administered 2014-10-15 – 2014-10-23 (×4): 800 mg via ORAL
  Filled 2014-10-14 (×6): qty 1

## 2014-10-14 MED ORDER — TRAZODONE HCL 100 MG PO TABS
100.0000 mg | ORAL_TABLET | Freq: Every day | ORAL | Status: DC
  Administered 2014-10-15 – 2014-10-16 (×3): 100 mg via ORAL
  Administered 2014-10-17: 200 mg via ORAL
  Administered 2014-10-18 – 2014-10-19 (×2): 100 mg via ORAL
  Administered 2014-10-20 – 2014-10-23 (×4): 200 mg via ORAL
  Filled 2014-10-14 (×11): qty 2

## 2014-10-14 MED ORDER — METOCLOPRAMIDE HCL 5 MG/ML IJ SOLN: 5.0000 mg | Freq: Three times a day (TID) | INTRAMUSCULAR | Status: DC | PRN

## 2014-10-14 MED ORDER — ONDANSETRON HCL 4 MG PO TABS: 4.0000 mg | ORAL_TABLET | Freq: Four times a day (QID) | ORAL | Status: DC | PRN

## 2014-10-14 MED ORDER — CEFAZOLIN SODIUM-DEXTROSE 2-3 GM-% IV SOLR
INTRAVENOUS | Status: DC | PRN
  Administered 2014-10-14: 2 g via INTRAVENOUS

## 2014-10-14 MED ORDER — GLYCOPYRROLATE 0.2 MG/ML IJ SOLN
INTRAMUSCULAR | Status: DC | PRN
Start: 2014-10-14 — End: 2014-10-14
  Administered 2014-10-14: .2 mg via INTRAVENOUS

## 2014-10-14 MED ORDER — ONDANSETRON HCL 4 MG/2ML IJ SOLN
4.0000 mg | Freq: Four times a day (QID) | INTRAMUSCULAR | Status: DC | PRN
  Administered 2014-10-19 – 2014-10-22 (×2): 4 mg via INTRAVENOUS
  Filled 2014-10-14 (×2): qty 2

## 2014-10-14 MED ORDER — ACETAMINOPHEN 650 MG RE SUPP: 650.0000 mg | Freq: Four times a day (QID) | RECTAL | Status: DC | PRN

## 2014-10-14 MED ORDER — EPHEDRINE SULFATE 50 MG/ML IJ SOLN
INTRAMUSCULAR | Status: DC | PRN
Start: 2014-10-14 — End: 2014-10-14

## 2014-10-14 MED ORDER — HYDROCODONE-ACETAMINOPHEN 5-325 MG PO TABS
1.0000 | ORAL_TABLET | ORAL | Status: DC | PRN
  Filled 2014-10-14: qty 1

## 2014-10-14 SURGICAL SUPPLY — 52 items
BANDAGE ELASTIC 4 VELCRO ST LF (GAUZE/BANDAGES/DRESSINGS) ×1 IMPLANT
BANDAGE ELASTIC 6 VELCRO ST LF (GAUZE/BANDAGES/DRESSINGS) ×1 IMPLANT
BAR 250MM (Trauma) ×2 IMPLANT
BAR 300MM (Trauma) ×2 IMPLANT
BAR EXFIX SM BONE 10.5X250 (Trauma) ×2 IMPLANT
BAR EXFIX SM BONE 10.5X300 (Trauma) ×2 IMPLANT
BIT DRILL 3.5 SHOFT HALF PIN (BIT) ×2 IMPLANT
BNDG COHESIVE 6X5 TAN STRL LF (GAUZE/BANDAGES/DRESSINGS) ×3 IMPLANT
BNDG GAUZE ELAST 4 BULKY (GAUZE/BANDAGES/DRESSINGS) ×4 IMPLANT
CLAMP BAR TO BAR (Clamp) ×4 IMPLANT
CLAMP PIN TO BAR (Clamp) ×12 IMPLANT
COVER SURGICAL LIGHT HANDLE (MISCELLANEOUS) ×3 IMPLANT
DRAPE C-ARM 42X72 X-RAY (DRAPES) ×2 IMPLANT
DRAPE C-ARMOR (DRAPES) ×3 IMPLANT
DRAPE U-SHAPE 47X51 STRL (DRAPES) ×3 IMPLANT
ELECT REM PT RETURN 9FT ADLT (ELECTROSURGICAL) ×3
ELECTRODE REM PT RTRN 9FT ADLT (ELECTROSURGICAL) ×1 IMPLANT
GAUZE SPONGE 4X4 12PLY STRL (GAUZE/BANDAGES/DRESSINGS) ×1 IMPLANT
GAUZE XEROFORM 5X9 LF (GAUZE/BANDAGES/DRESSINGS) ×3 IMPLANT
GLOVE BIOGEL PI IND STRL 7.0 (GLOVE) IMPLANT
GLOVE BIOGEL PI INDICATOR 7.0 (GLOVE) ×4
GLOVE NEODERM STRL 7.5 LF PF (GLOVE) ×2 IMPLANT
GLOVE SURG NEODERM 7.5  LF PF (GLOVE) ×4
GLOVE SURG SYN 7.5  E (GLOVE) ×2
GLOVE SURG SYN 7.5 E (GLOVE) ×1 IMPLANT
GLOVE SURG SYN 7.5 PF PI (GLOVE) IMPLANT
GOWN STRL REIN XL XLG (GOWN DISPOSABLE) ×3 IMPLANT
HANDPIECE INTERPULSE COAX TIP (DISPOSABLE)
KIT BASIN OR (CUSTOM PROCEDURE TRAY) ×3 IMPLANT
KIT ROOM TURNOVER OR (KITS) ×3 IMPLANT
NEEDLE 22X1 1/2 (OR ONLY) (NEEDLE) IMPLANT
NS IRRIG 1000ML POUR BTL (IV SOLUTION) ×3 IMPLANT
PACK ORTHO EXTREMITY (CUSTOM PROCEDURE TRAY) ×3 IMPLANT
PAD ARMBOARD 7.5X6 YLW CONV (MISCELLANEOUS) ×4 IMPLANT
PADDING CAST COTTON 6X4 STRL (CAST SUPPLIES) ×3 IMPLANT
PIN HALF 5X40 (EXFIX) ×4 IMPLANT
PIN TRACTION 5X50 (EXFIX) ×2 IMPLANT
SET HNDPC FAN SPRY TIP SCT (DISPOSABLE) IMPLANT
SET IRRIG Y TYPE TUR BLADDER L (SET/KITS/TRAYS/PACK) ×2 IMPLANT
SPONGE LAP 18X18 X RAY DECT (DISPOSABLE) ×3 IMPLANT
SPONGE SCRUB IODOPHOR (GAUZE/BANDAGES/DRESSINGS) ×3 IMPLANT
STAPLER VISISTAT 35W (STAPLE) IMPLANT
STOCKINETTE IMPERVIOUS LG (DRAPES) ×1 IMPLANT
SUT ETHILON 2 0 PSLX (SUTURE) ×2 IMPLANT
SYR CONTROL 10ML LL (SYRINGE) IMPLANT
TOWEL OR 17X24 6PK STRL BLUE (TOWEL DISPOSABLE) ×6 IMPLANT
TOWEL OR 17X26 10 PK STRL BLUE (TOWEL DISPOSABLE) ×6 IMPLANT
TUBE CONNECTING 12'X1/4 (SUCTIONS) ×1
TUBE CONNECTING 12X1/4 (SUCTIONS) ×2 IMPLANT
UNDERPAD 30X30 INCONTINENT (UNDERPADS AND DIAPERS) ×3 IMPLANT
WATER STERILE IRR 1000ML POUR (IV SOLUTION) ×2 IMPLANT
YANKAUER SUCT BULB TIP NO VENT (SUCTIONS) ×3 IMPLANT

## 2014-10-14 NOTE — ED Notes (Signed)
MD at bedside. 

## 2014-10-14 NOTE — H&P (Signed)
ORTHOPAEDIC HISTORY AND PHYSICAL   Chief Complaint: Left ankle injury  HPI: [name] is a 38 y.o. female who complains of left ankle injury s/p fall from 2nd story ledge.  She was holding onto railing and then fell and landed directly on her feet.  Sustained an injury to left ankle.  Denies LOC, neck pain, abd, pain, back pain.  Ortho consulted.  Past Medical History  Diagnosis Date  . Depression   . Shortness of breath     on exertion  . Sleep apnea     mild per pt- no CPAP  . Headache(784.0)     migraines  . Anemia   . Asthma     no inhaler  . Menorrhagia with regular cycle 01/20/2013  . S/P endometrial ablation 01/20/2013  . S/P laparoscopic assisted vaginal hysterectomy (LAVH) 01/21/2013  . Preoperative clearance    Past Surgical History  Procedure Laterality Date  . Tubal ligation    . Breast lumpectomy      RT side  . Laparoscopic assisted vaginal hysterectomy N/A 01/21/2013    Procedure: LAPAROSCOPIC ASSISTED VAGINAL HYSTERECTOMY;  Surgeon: Thornell Sartorius, MD;  Location: [facility];  Service: Gynecology;  Laterality: N/A;  . Bilateral salpingectomy N/A 01/21/2013    Procedure: BILATERAL SALPINGECTOMY;  Surgeon: Thornell Sartorius, MD;  Location: [facility];  Service: Gynecology;  Laterality: N/A;  . Abdominal hysterectomy    . Thyroid lobectomy Left 10/01/2013    Procedure: THYROID LOBECTOMY;  Surgeon: Madilyn Hook, DO;  Location: [facility];  Service: General;  Laterality: Left;   History   Social History  . Marital Status: Single    Spouse Name: N/A    Number of Children: N/A  . Years of Education: N/A   Social History Main Topics  . Smoking status: Current Some Day Smoker -- 0.25 packs/day    Types: Cigarettes  . Smokeless tobacco: None     Comment: 1 pack will last a week and a half  . Alcohol Use: No  . Drug Use: No  . Sexual Activity: No   Other Topics Concern  . None   Social History Narrative   Family History  Problem Relation Age of Onset  . Depression  Father    Allergies  Allergen Reactions  . Shrimp [Shellfish Allergy] Swelling   Prior to Admission medications   Medication Sig Start Date End Date Taking? Authorizing Provider  albuterol (PROVENTIL HFA;VENTOLIN HFA) 108 (90 BASE) MCG/ACT inhaler Inhale 2 puffs into the lungs every 6 (six) hours as needed for wheezing. 05/25/13  Yes Leandrew Koyanagi, MD  beclomethasone (QVAR) 40 MCG/ACT inhaler Inhale 2 puffs into the lungs 2 (two) times daily. For 2 months, then may discontinue if wheezing resolved 05/25/13  Yes Leandrew Koyanagi, MD  BIOTIN PO Take 1 tablet by mouth daily.   Yes Historical Provider, MD  DULoxetine (CYMBALTA) 20 MG capsule Take 20 mg by mouth 2 (two) times daily.   Yes Historical Provider, MD  hydrOXYzine (ATARAX/VISTARIL) 50 MG tablet Take 50 mg by mouth 3 (three) times daily as needed for anxiety.    Yes Historical Provider, MD  traZODone (DESYREL) 100 MG tablet Take 100-200 mg by mouth at bedtime.    Yes Historical Provider, MD  doxycycline (VIBRAMYCIN) 50 MG capsule Take 2 capsules (100 mg total) by mouth 2 (two) times daily. Patient not taking: Reported on 10/14/2014 10/18/13   Doreen Salvage, MD  HYDROcodone-acetaminophen (HYCET) 7.5-325 mg/15 ml solution Take 10-15 mLs by mouth every  4 (four) hours as needed for moderate pain. Patient not taking: Reported on 10/14/2014 10/18/13   Doreen Salvage, MD  ibuprofen (ADVIL,MOTRIN) 800 MG tablet Take 800 mg by mouth every 8 (eight) hours as needed (mild pain). 01/22/13   Janyth Contes, MD   Dg Lumbar Spine Complete  10/14/2014   CLINICAL DATA:  Fall from second story window, posterior acute low back pain, decreased range of motion  EXAM: LUMBAR SPINE - COMPLETE 4+ VIEW  COMPARISON:  None.  FINDINGS: Normal lumbar spine alignment without acute compression fracture, wedge-shaped deformity or focal kyphosis. No significant degenerative process. Preserved vertebral body heights and disc spaces. No pars defects. Intact pedicles. Normal SI  joints.  IMPRESSION: No acute osseous finding   Electronically Signed   By: Daryll Brod M.D.   On: 10/14/2014 18:27   Dg Tibia/fibula Right  10/14/2014   CLINICAL DATA:  Fall from window, left ankle fracture  EXAM: RIGHT TIBIA AND FIBULA - 2 VIEW  COMPARISON:  The  FINDINGS: Medial soft tissue swelling is identified adjacent to the knee. No fracture or dislocation is identified. No radiopaque foreign body.  IMPRESSION: Medial soft tissue swelling over the proximal right lower leg.   Electronically Signed   By: Conchita Paris M.D.   On: 10/14/2014 18:27   Dg Ankle Complete Right  10/14/2014   CLINICAL DATA:  Fall out of the window, ankle pain  EXAM: RIGHT ANKLE - COMPLETE 3+ VIEW  COMPARISON:  None.  FINDINGS: There is severe comminution of complex talar dome and body fracture with probable tibio-talar disassociation. Superior calcaneal fracture is not excluded. No radiopaque foreign body. Soft tissue deformity is present.  IMPRESSION: Complex comminuted talar fracture with probable tibiotalar dissociation and possible calcaneal involvement. This could be better evaluated at noncontrast CT.   Electronically Signed   By: Conchita Paris M.D.   On: 10/14/2014 15:24   Ct Head Wo Contrast  10/14/2014   CLINICAL DATA:  Initial evaluation for pain left side of face after falling from a height  EXAM: CT HEAD WITHOUT CONTRAST  TECHNIQUE: Contiguous axial images were obtained from the base of the skull through the vertex without intravenous contrast.  COMPARISON:  12/09/2007  FINDINGS: No mass lesion. No midline shift. No acute hemorrhage or hematoma. No extra-axial fluid collections. No evidence of acute infarction. No skull fracture. Visualized portions of the sinuses are clear.  IMPRESSION: Negative head CT   Electronically Signed   By: Skipper Cliche M.D.   On: 10/14/2014 14:59    Positive ROS: All other systems have been reviewed and were otherwise negative with the exception of those mentioned in the HPI  and as above.  Physical Exam: General: Alert, no acute distress Cardiovascular: No pedal edema Respiratory: No cyanosis, no use of accessory musculature GI: No organomegaly, abdomen is soft and non-tender Skin: No lesions in the area of chief complaint Neurologic: Sensation intact distally Psychiatric: Patient is competent for consent with normal mood and affect Lymphatic: No axillary or cervical lymphadenopathy  MUSCULOSKELETAL:  - gross deformity of left ankle - superficial abrasion over distal fibula without evidence of exposed bone - foot wwp, nvi - wiggling toes  Assessment: Left talus fracture  Plan: - closed reduced in ER and splint - NPO - admit to ortho - to OR for I&D and ex fix   N. Eduard Roux, MD [place] 7:25 PM

## 2014-10-14 NOTE — Op Note (Signed)
   Date of Surgery: 10/14/2014  INDICATIONS: Ms. Delmont is a 38 y.o.-year-old female who sustained a left open talus fracture; she was indicated for external fixation due to the displaced and unstable nature of the fracture and came to the operating room today for this procedure. The patient did consent to the procedure after discussion of the risks and benefits.   PREOPERATIVE DIAGNOSIS: left open talus fracture dislocation, Hawkins III  POSTOPERATIVE DIAGNOSIS: Same.  PROCEDURE: External fixation left ankle fracture 20692 multiplane  SURGEON: N. Eduard Roux, M.D.  ANESTHESIA: general  IV FLUIDS AND URINE: See anesthesia.  ESTIMATED BLOOD LOSS: minimal mL.  IMPLANTS: [employer]  DRAINS: None.  COMPLICATIONS: None.  DESCRIPTION OF PROCEDURE: The patient was identified in the preoperative holding area.  The operative site was marked by the surgeon and confirmed by the patient.  He was brought back to the operating room.  Anesthesia was induced by the anesthesia team.  A well padded nonsterile tourniquet was placed. The operative extremity was prepped and draped in standard sterile fashion.  A timeout was performed.  Preoperative antibiotics were given.  The traumatic wound was extended.  Sharp excisional debridement of the bone, muscle, skin was carried out using rongeur and knife.  The bony ends were delivered.  There was a piece of talus that had no soft tissue attachment.  This was excised.  6 liters of saline was irrigated through the wound.  We then turned our attention to the external fixator.  The bony landmarks were palpated and the pin sites were marked on the skin.  Each Schanz pin was placed in the same fashion -- first drilling with the 3.5 mm drill while copiously irrigating, then hand placing the pin.  This was confirmed on x-ray on both views.  The ex-fix clamps were placed onto pins and the fracture was pulled into the proper alignment.  The clamps were completely  tightened.   Final x-rays were taken in AP and lateral views to confirm the reduction and pin lengths. The wounds were cleaned and dried a final time and a sterile dressing consisting of Xeroform and kerlix was placed.  Of note, the patient's compartments remained soft throughout the procedure.  The patient was then transferred back to the bed and left the operating room in stable condition.  All sponge and instrument counts were correct.  POSTOPERATIVE PLAN: Ms. Seybold will remain non weight bearing with the leg elevated.  she will return to the operating room for definitive fixation when the swelling has gone down.  Ms. Estey will receive DVT prophylaxis based on other medications, activity level, and risk ratio of bleeding to thrombosis.  Pin site care will be initiated on postoperative day one.  Azucena Cecil, MD [place] 9:54 PM

## 2014-10-14 NOTE — ED Notes (Signed)
Called OR about expected time of surgery and to get Pain meds ordered for pt.  I Was instructed to page Dr. Erlinda Hong, and no time of sx was given.

## 2014-10-14 NOTE — Anesthesia Procedure Notes (Signed)
Procedure Name: Intubation Date/Time: 10/14/2014 8:56 PM Performed by: Garner Nash Pre-anesthesia Checklist: Patient identified, Timeout performed, Emergency Drugs available, Suction available and Patient being monitored Patient Re-evaluated:Patient Re-evaluated prior to inductionOxygen Delivery Method: Circle system utilized Preoxygenation: Pre-oxygenation with 100% oxygen Intubation Type: IV induction Ventilation: Mask ventilation without difficulty Laryngoscope Size: Mac and 4 Grade View: Grade IV Tube type: Oral Tube size: 7.0 mm Number of attempts: 1 Airway Equipment and Method: Stylet Placement Confirmation: breath sounds checked- equal and bilateral,  positive ETCO2 and CO2 detector Secured at: 22 cm Tube secured with: Tape Dental Injury: Teeth and Oropharynx as per pre-operative assessment  Comments: Anterior airway. DL x1 with MAC 3 and DL x 1 with MAC 4 with no view. Intubation by Dr. Massage with curved ett/stylet.

## 2014-10-14 NOTE — Progress Notes (Signed)
D; stopped CT of Lt. Ankle per MD order before tx to room.     Pt is in 5N05, tx via bed. No distress noted.

## 2014-10-14 NOTE — Transfer of Care (Signed)
Immediate Anesthesia Transfer of Care Note  Patient: [name]  Procedure(s) Performed: Procedure(s): IRRIGATION AND DEBRIDEMENT OPEN TIBIA FRACTURE LEFT LEG (Left) EXTERNAL FIXATION LEFT ANKLE (Left)  Patient Location: PACU  Anesthesia Type:General  Level of Consciousness: awake, alert  and oriented  Airway & Oxygen Therapy: Patient Spontanous Breathing and Patient connected to nasal cannula oxygen  Post-op Assessment: Report given to PACU RN and Post -op Vital signs reviewed and stable  Post vital signs: Reviewed and stable  Complications: No apparent anesthesia complications

## 2014-10-14 NOTE — ED Provider Notes (Signed)
CSN: [id number]     Arrival date & time 10/14/14  1341 History   First MD Initiated Contact with Patient 10/14/14 1343     Chief Complaint  Patient presents with  . Fall     (Consider location/radiation/quality/duration/timing/severity/associated sxs/prior Treatment) HPI Comments: Patient presents to the ER for evaluation of ankle injury. Patient reports that she was on a ladder putting a strain injury second-story window. She reports that the ladder slipped out from under her. She was able to grab the ledge of the window and hold on. She then tried to drop to the ground from that position, but hit her ankle on a chair that was below her. Patient reports that the ankle was deformed when she fell. She was unable to stand on it, had to crawl into the house to call EMS. EMS has splinted the ankle. Patient has been administered fentanyl for pain control. Patient reports that she did hit the left side of her face on the chair as well, no loss of consciousness. She denies neck and back pain. No chest pain, shortness breath, abdominal pain.  Patient is a 38 y.o. female presenting with fall.  Fall Pertinent negatives include no chest pain and no shortness of breath.    Past Medical History  Diagnosis Date  . Depression   . Shortness of breath     on exertion  . Sleep apnea     mild per pt- no CPAP  . Headache(784.0)     migraines  . Anemia   . Asthma     no inhaler  . Menorrhagia with regular cycle 01/20/2013  . S/P endometrial ablation 01/20/2013  . S/P laparoscopic assisted vaginal hysterectomy (LAVH) 01/21/2013  . Preoperative clearance    Past Surgical History  Procedure Laterality Date  . Tubal ligation    . Breast lumpectomy      RT side  . Laparoscopic assisted vaginal hysterectomy N/A 01/21/2013    Procedure: LAPAROSCOPIC ASSISTED VAGINAL HYSTERECTOMY;  Surgeon: Thornell Sartorius, MD;  Location: [facility];  Service: Gynecology;  Laterality: N/A;  . Bilateral salpingectomy N/A 01/21/2013    Procedure: BILATERAL SALPINGECTOMY;  Surgeon: Thornell Sartorius, MD;  Location: [facility];  Service: Gynecology;  Laterality: N/A;  . Abdominal hysterectomy    . Thyroid lobectomy Left 10/01/2013    Procedure: THYROID LOBECTOMY;  Surgeon: Madilyn Hook, DO;  Location: [facility];  Service: General;  Laterality: Left;   Family History  Problem Relation Age of Onset  . Depression Father    History  Substance Use Topics  . Smoking status: Current Some Day Smoker -- 0.25 packs/day    Types: Cigarettes  . Smokeless tobacco: Not on file     Comment: 1 pack will last a week and a half  . Alcohol Use: No   OB History    No data available     Review of Systems  Respiratory: Negative for shortness of breath.   Cardiovascular: Negative for chest pain.  Musculoskeletal: Positive for arthralgias. Negative for back pain and neck pain.  All other systems reviewed and are negative.     Allergies  Shrimp  Home Medications   Prior to Admission medications   Medication Sig Start Date End Date Taking? Authorizing Provider  albuterol (PROVENTIL HFA;VENTOLIN HFA) 108 (90 BASE) MCG/ACT inhaler Inhale 2 puffs into the lungs every 6 (six) hours as needed for wheezing. 05/25/13   Leandrew Koyanagi, MD  beclomethasone (QVAR) 40 MCG/ACT inhaler Inhale 2 puffs into the lungs 2 (  two) times daily. For 2 months, then may discontinue if wheezing resolved 05/25/13   Leandrew Koyanagi, MD  BIOTIN PO Take 1 tablet by mouth daily.    Historical Provider, MD  doxycycline (VIBRAMYCIN) 50 MG capsule Take 2 capsules (100 mg total) by mouth 2 (two) times daily. 10/18/13   Doreen Salvage, MD  DULoxetine (CYMBALTA) 20 MG capsule Take 20 mg by mouth 2 (two) times daily.    Historical Provider, MD  HYDROcodone-acetaminophen (HYCET) 7.5-325 mg/15 ml solution Take 10-15 mLs by mouth every 4 (four) hours as needed for moderate pain. 10/18/13   Doreen Salvage, MD  hydrOXYzine (ATARAX/VISTARIL) 50 MG tablet Take 50 mg by mouth 3 (three) times daily  as needed.     Historical Provider, MD  ibuprofen (ADVIL,MOTRIN) 800 MG tablet Take 800 mg by mouth every 8 (eight) hours as needed (mild pain). 01/22/13   Janyth Contes, MD  oxyCODONE-acetaminophen (PERCOCET/ROXICET) 5-325 MG per tablet Take 1-2 tablets by mouth every 6 (six) hours as needed for moderate pain or severe pain. 01/22/13   Janyth Contes, MD  Prenatal Vit-Fe Fumarate-FA (PRENATAL MULTIVITAMIN) TABS tablet Take 1 tablet by mouth daily at 12 noon.    Historical Provider, MD  traZODone (DESYREL) 100 MG tablet Take 100-200 mg by mouth at bedtime.     Historical Provider, MD   BP 120/76 mmHg  Pulse 94  Temp(Src) 97.7 F (36.5 C) (Oral)  Resp 14  Ht 5' 6.5" (1.689 m)  Wt 161 lb (73.029 kg)  BMI 25.60 kg/m2  SpO2 100%  LMP 01/07/2013 Physical Exam  Constitutional: She is oriented to person, place, and time. She appears well-developed and well-nourished. No distress.  HENT:  Head: Normocephalic. Head is with contusion (no crepitus, no abrasion or laceration).    Right Ear: Hearing normal.  Left Ear: Hearing normal.  Nose: Nose normal.  Mouth/Throat: Oropharynx is clear and moist and mucous membranes are normal.  Eyes: Conjunctivae and EOM are normal. Pupils are equal, round, and reactive to light.  Normal extraocular muscle movement, no concern for blowout fracture or entrapment  Neck: Normal range of motion. Neck supple. No spinous process tenderness and no muscular tenderness present.  No midline tenderness, no pain with range of motion, no pain with axial loading. Cervical spine clinically cleared.  Cardiovascular: Regular rhythm, S1 normal and S2 normal.  Exam reveals no gallop and no friction rub.   No murmur heard. Pulmonary/Chest: Effort normal and breath sounds normal. No respiratory distress. She exhibits no tenderness.  Abdominal: Soft. Normal appearance and bowel sounds are normal. There is no hepatosplenomegaly. There is no tenderness. There is no  rebound, no guarding, no tenderness at McBurney's point and negative Murphy's sign. No hernia.  Musculoskeletal: Normal range of motion.  Neurological: She is alert and oriented to person, place, and time. She has normal strength. No cranial nerve deficit or sensory deficit. Coordination normal. GCS eye subscore is 4. GCS verbal subscore is 5. GCS motor subscore is 6.  Skin: Skin is warm, dry and intact. No rash noted. No cyanosis.  Psychiatric: She has a normal mood and affect. Her speech is normal and behavior is normal. Thought content normal.  Nursing note and vitals reviewed.   ED Course  Procedures (including critical care time) Labs Review Labs Reviewed  BASIC METABOLIC PANEL - Abnormal; Notable for the following:    GFR calc non Af Amer 80 (*)    All other components within normal limits  CBC WITH DIFFERENTIAL  PROTIME-INR  URINALYSIS, ROUTINE W REFLEX MICROSCOPIC    Imaging Review Dg Ankle Complete Right  10/14/2014   CLINICAL DATA:  Fall out of the window, ankle pain  EXAM: RIGHT ANKLE - COMPLETE 3+ VIEW  COMPARISON:  None.  FINDINGS: There is severe comminution of complex talar dome and body fracture with probable tibio-talar disassociation. Superior calcaneal fracture is not excluded. No radiopaque foreign body. Soft tissue deformity is present.  IMPRESSION: Complex comminuted talar fracture with probable tibiotalar dissociation and possible calcaneal involvement. This could be better evaluated at noncontrast CT.   Electronically Signed   By: Conchita Paris M.D.   On: 10/14/2014 15:24   Ct Head Wo Contrast  10/14/2014   CLINICAL DATA:  Initial evaluation for pain left side of face after falling from a height  EXAM: CT HEAD WITHOUT CONTRAST  TECHNIQUE: Contiguous axial images were obtained from the base of the skull through the vertex without intravenous contrast.  COMPARISON:  12/09/2007  FINDINGS: No mass lesion. No midline shift. No acute hemorrhage or hematoma. No extra-axial  fluid collections. No evidence of acute infarction. No skull fracture. Visualized portions of the sinuses are clear.  IMPRESSION: Negative head CT   Electronically Signed   By: Skipper Cliche M.D.   On: 10/14/2014 14:59     EKG Interpretation None      MDM   Final diagnoses:  Fall   left talar fracture  She presents to the ER for evaluation after a fall. Patient playing of isolated left ankle injury. She did hit the left side of her face, however, there was no significant trauma to the region. She is not complaining of headache, but CT scan was performed to evaluate for intracranial injury. None was seen. Patient did not have any neck pain or tenderness. She was clinically cleared from a cervical spine standpoint. Patient did not have any thoracic or lumbosacral tenderness. X-ray of the ankle did show comminuted talar fracture. She will be sent back to radiology to have lumbar spine x-ray to ensure that there is no vertebral fracture from the mechanism. Dr. Erlinda Hong, orthopedics has evaluated the patient here in the ER. He is going to take her to the operating room to fix her talar fracture.  She does have a skin tear or laceration over the lateral malleolus where the malleolus is tenting the skin slightly. Cannot rule out open injury, patient understood Ancef and tetanus.    Orpah Greek, MD 10/14/14 [phone]

## 2014-10-14 NOTE — ED Notes (Signed)
Patient transported to X-ray 

## 2014-10-14 NOTE — Anesthesia Preprocedure Evaluation (Addendum)
Anesthesia Evaluation  Patient identified by MRN, date of birth, ID band Patient awake    Reviewed: Allergy & Precautions, H&P , NPO status , Patient's Chart, lab work & pertinent test results  Airway Mallampati: III  TM Distance: >3 FB Neck ROM: Full    Dental  (+) Teeth Intact, Dental Advisory Given   Pulmonary shortness of breath, asthma , sleep apnea , Current Smoker,  breath sounds clear to auscultation        Cardiovascular negative cardio ROS  Rhythm:Regular Rate:Normal     Neuro/Psych    GI/Hepatic negative GI ROS, Neg liver ROS,   Endo/Other  negative endocrine ROS  Renal/GU negative Renal ROS     Musculoskeletal   Abdominal   Peds  Hematology negative hematology ROS (+)   Anesthesia Other Findings   Reproductive/Obstetrics                           Anesthesia Physical Anesthesia Plan  ASA: II and emergent  Anesthesia Plan: General   Post-op Pain Management:    Induction: Intravenous  Airway Management Planned: Oral ETT  Additional Equipment:   Intra-op Plan:   Post-operative Plan: Extubation in OR  Informed Consent: I have reviewed the patients History and Physical, chart, labs and discussed the procedure including the risks, benefits and alternatives for the proposed anesthesia with the patient or authorized representative who has indicated his/her understanding and acceptance.   Dental advisory given  Plan Discussed with: CRNA and Surgeon  Anesthesia Plan Comments:         Anesthesia Quick Evaluation

## 2014-10-14 NOTE — ED Notes (Signed)
Pt was putting a screen in a second story window when she slipped out the window , she was able to to hang and dropp down onto the ground , pt is c/o left ankle pain

## 2014-10-14 NOTE — Progress Notes (Signed)
Orthopedic Tech Progress Note Patient Details:  Florabelle Cardin May 05, 1976 [id number] Ordered by Dr. Domingo Madeira Devices Type of Ortho Device: Ace wrap, Stirrup splint, Post (short leg) splint Ortho Device/Splint Location: LLE Ortho Device/Splint Interventions: Ordered, Application   Braulio Bosch 10/14/2014, 4:33 PM

## 2014-10-15 MED ORDER — OXYCODONE HCL 5 MG PO TABS
ORAL_TABLET | ORAL | Status: AC
  Administered 2014-10-15: 5 mg
  Filled 2014-10-15: qty 2

## 2014-10-15 NOTE — Plan of Care (Signed)
Problem: Phase I Progression Outcomes Goal: Clear liquids, advance diet as tolerated Outcome: Completed/Met Date Met:  10/15/14

## 2014-10-15 NOTE — Progress Notes (Signed)
Patient ID: [name], female   DOB: Jun 03, 1976, 38 y.o.   MRN: [id number] Patient is postoperative day 1 external fixation for open talar fracture. Patient does have a increased swelling in both lower extremities. There is a small amount of bleeding through the dressing. Anticipate stabilization for the weekend and reevaluate for surgery on Monday.

## 2014-10-15 NOTE — Progress Notes (Signed)
Utilization Review completed.  

## 2014-10-15 NOTE — Anesthesia Postprocedure Evaluation (Signed)
  Anesthesia Post-op Note  Patient: [name]  Procedure(s) Performed: Procedure(s): IRRIGATION AND DEBRIDEMENT OPEN TIBIA FRACTURE LEFT LEG (Left) EXTERNAL FIXATION LEFT ANKLE (Left)  Patient Location: PACU  Anesthesia Type:General  Level of Consciousness: awake and alert   Airway and Oxygen Therapy: Patient Spontanous Breathing  Post-op Pain: mild  Post-op Assessment: Post-op Vital signs reviewed  Post-op Vital Signs: stable  Last Vitals:  Filed Vitals:   10/15/14 0200  BP: 108/67  Pulse: 18  Temp: 36.7 C  Resp: 18    Complications: No apparent anesthesia complications

## 2014-10-15 NOTE — Plan of Care (Signed)
Problem: Phase I Progression Outcomes Goal: Pain controlled with appropriate interventions Outcome: Completed/Met Date Met:  10/15/14 Goal: Post op CMS Neurovascular WDL Outcome: Completed/Met Date Met:  10/15/14 Goal: Initial discharge plan identified Outcome: Completed/Met Date Met:  10/15/14 Goal: Voiding-avoid urinary catheter unless indicated Outcome: Completed/Met Date Met:  10/15/14

## 2014-10-15 NOTE — Evaluation (Signed)
Physical Therapy Evaluation Patient Details Name: [name] MRN: [id number] DOB: Oct 01, 1976 Today's Date: 10/15/2014   History of Present Illness  pt is a 38 y.o. female adm after fall from 2nd store; s/p I&D and external fixation placement due to Lt Talus fx.   Clinical Impression  Patient is s/p above surgery resulting in functional limitations due to the deficits listed below (see PT Problem List). Patient will benefit from skilled PT to increase their independence and safety with mobility to allow discharge to the venue listed below. Planned for additional surgery, Monday ? Pending , to remove external fixator. Pt with multiple STE house and steps in house, will need to address prior to D/C.      Follow Up Recommendations No PT follow up;Supervision/Assistance - 24 hour    Equipment Recommendations  Other (comment) (TBD crutches vs RW vs knee walker )    Recommendations for Other Services OT consult     Precautions / Restrictions Precautions Precautions: Fall Precaution Comments: ext fix in place Restrictions Weight Bearing Restrictions: Yes LLE Weight Bearing: Non weight bearing      Mobility  Bed Mobility               General bed mobility comments: pt up on bedside commode and returend to chair   Transfers Overall transfer level: Needs assistance Equipment used: Rolling walker (2 wheeled) Transfers: Sit to/from [facility] Sit to Stand: Min guard;From elevated surface Stand pivot transfers: Min guard       General transfer comment: cues for hand placement and safety with RW; pt demo good ability to maintain NWB status on Lt LE  Ambulation/Gait             General Gait Details: pivotal steps to chair  Stairs            Wheelchair Mobility    Modified Rankin (Stroke Patients Only)       Balance Overall balance assessment: Needs assistance Sitting-balance support: Feet supported;No upper extremity supported Sitting  balance-Leahy Scale: Fair     Standing balance support: During functional activity;Bilateral upper extremity supported Standing balance-Leahy Scale: Poor Standing balance comment: relies on RW for balance                             Pertinent Vitals/Pain Pain Assessment: 0-10 Pain Score: 10-Worst pain ever Pain Location: Lt ankle and headache  Pain Descriptors / Indicators: Headache;Aching;Throbbing Pain Intervention(s): Monitored during session;Repositioned;Patient requesting pain meds-RN notified    Home Living Family/patient expects to be discharged to:: Private residence Living Arrangements: Children;Other relatives Available Help at Discharge: Family;Available PRN/intermittently Type of Home: House Home Access: Stairs to enter Entrance Stairs-Rails: Left Entrance Stairs-Number of Steps: 4-5 Home Layout: Bed/bath upstairs;Two level Home Equipment: None      Prior Function Level of Independence: Independent               Hand Dominance        Extremity/Trunk Assessment   Upper Extremity Assessment: Defer to OT evaluation           Lower Extremity Assessment: LLE deficits/detail   LLE Deficits / Details: able to perform SLR; ext fix in place of ankle  Cervical / Trunk Assessment: Normal  Communication   Communication: No difficulties  Cognition Arousal/Alertness: Awake/alert Behavior During Therapy: WFL for tasks assessed/performed Overall Cognitive Status: Within Functional Limits for tasks assessed  General Comments General comments (skin integrity, edema, etc.): educated on elevation for edema control     Exercises        Assessment/Plan    PT Assessment Patient needs continued PT services  PT Diagnosis Difficulty walking;Generalized weakness;Acute pain   PT Problem List Decreased activity tolerance;Decreased range of motion;Decreased strength;Decreased balance;Decreased mobility;Decreased knowledge  of use of DME;Pain  PT Treatment Interventions DME instruction;Gait training;Stair training;Functional mobility training;Therapeutic exercise;Therapeutic activities;Neuromuscular re-education;Balance training;Patient/family education   PT Goals (Current goals can be found in the Care Plan section) Acute Rehab PT Goals Patient Stated Goal: to get pain med PT Goal Formulation: With patient Time For Goal Achievement: 10/22/14 Potential to Achieve Goals: Good    Frequency Min 5X/week   Barriers to discharge Decreased caregiver support family available PRN    Co-evaluation               End of Session Equipment Utilized During Treatment: Gait belt Activity Tolerance: Patient limited by pain Patient left: in chair;with call bell/phone within reach Nurse Communication: Mobility status;Precautions;Weight bearing status    Functional Assessment Tool Used: clinical judgement  Functional Limitation: Mobility: Walking and moving around Mobility: Walking and Moving Around Current Status ([id number]): At least 1 percent but less than 20 percent impaired, limited or restricted Mobility: Walking and Moving Around Goal Status [phone]): 0 percent impaired, limited or restricted    Time: 0951-1003 PT Time Calculation (min) (ACUTE ONLY): 12 min   Charges:   PT Evaluation $Initial PT Evaluation Tier I: 1 Procedure PT Treatments $Therapeutic Activity: 8-22 mins   PT G Codes:   Functional Assessment Tool Used: clinical judgement  Functional Limitation: Mobility: Walking and moving around    [place], Arienna Benegas [place], [place]  [phone] 10/15/2014, 10:53 AM

## 2014-10-16 NOTE — Progress Notes (Signed)
Patient ID: [name], female   DOB: [birth year], 38 y.o.   MRN: [id number] Patient status post external fixation for comminuted talar fracture. CT scan was reviewed today. I will discuss this with Dr. Erlinda Hong for treatment options.

## 2014-10-16 NOTE — Progress Notes (Signed)
Plan is for definitive ankle surgery on wed as soft tissue allows by Dr. Sharol Given on Wednesday.  Patient may go home and come back on wed.    Azucena Cecil, MD [place] 8:51 PM

## 2014-10-16 NOTE — Progress Notes (Signed)
CSW (Clinical Social Worker) aware of consult. At this time, PT is recommending no follow up. Pt has no social work needs. Please reconsult should needs arise.  Hartford Maulden, LCSWA Weekend CSW [phone]  

## 2014-10-17 ENCOUNTER — Encounter (HOSPITAL_COMMUNITY): Payer: Self-pay | Admitting: Orthopaedic Surgery

## 2014-10-17 NOTE — Progress Notes (Signed)
Occupational Therapy Evaluation Patient Details Name: [name] MRN: [id number] DOB: 1976-10-10 Today's Date: 10/17/2014    History of Present Illness pt is a 38 y.o. female adm after fall from [employer]; s/p I&D and external fixation placement due to Lt Talus fx.    Clinical Impression   PTA, pt independent with mobility and ADL. Pt mobilizing @ min guard level with external fixator. Min A with LB ADL. Discussed home set up and D/C plans. Pt's daughter will be able to assist after school. Pt should be able to D/C home @ RW level after surgery when medically stable. Pt requesting w/c. Will need 3 in 1. Will plan to see again after surgery to further assess needs to facilitate D/C home with intermittent S.     Follow Up Recommendations  No OT follow up;Supervision - Intermittent    Equipment Recommendations  3 in 1 bedside comode    Recommendations for Other Services       Precautions / Restrictions Precautions Precautions: Fall Precaution Comments: ext fix in place Restrictions Weight Bearing Restrictions: Yes LLE Weight Bearing: Non weight bearing      Mobility Bed Mobility Overal bed mobility: Modified Independent                Transfers Overall transfer level: Needs assistance Equipment used: Rolling walker (2 wheeled) Transfers: Sit to/from [facility] Sit to Stand: Min guard Stand pivot transfers: Min guard            Balance Overall balance assessment: Needs assistance   Sitting balance-Leahy Scale: Good       Standing balance-Leahy Scale: Poor Standing balance comment: heavy reliance on RW                            ADL Overall ADL's : Needs assistance/impaired     Grooming: Supervision/safety   Upper Body Bathing: Set up   Lower Body Bathing: Minimal assistance;Sit to/from stand   Upper Body Dressing : Set up   Lower Body Dressing: Minimal assistance;Sit to/from stand   Toilet Transfer: Min  guard;Ambulation;BSC   Toileting- Clothing Manipulation and Hygiene: Modified independent;Sitting/lateral lean       Functional mobility during ADLs: Min guard;Rolling walker;Cueing for safety;Cueing for sequencing General ADL Comments: Mobilizing well with ex fix on. Able to reach foot @ bed level.Will further assess after surgery.. May benefit from use of reacher. Pt states she will need a nurse to assist with bathing. Feel pt will be at set up level and daughter can assist with this in the evenings.     Vision                     Perception     Praxis      Pertinent Vitals/Pain Pain Assessment: 0-10 Pain Score: 7  Pain Location: [place] Pain Descriptors / Indicators: Aching Pain Intervention(s): Limited activity within patient's tolerance;Monitored during session;Repositioned; ice applied     Hand Dominance Left   Extremity/Trunk Assessment Upper Extremity Assessment Upper Extremity Assessment: Overall WFL for tasks assessed   Lower Extremity Assessment Lower Extremity Assessment: Defer to PT evaluation (ex fix L ankle)   Cervical / Trunk Assessment Cervical / Trunk Assessment: Normal   Communication Communication Communication: No difficulties   Cognition Arousal/Alertness: Awake/alert Behavior During Therapy: WFL for tasks assessed/performed Overall Cognitive Status: Within Functional Limits for tasks assessed  General Comments       Exercises       Shoulder Instructions      Home Living Family/patient expects to be discharged to:: Private residence Living Arrangements: Children;Other relatives Available Help at Discharge: Family;Available PRN/intermittently Type of Home: House Home Access: Stairs to enter [employer] of Steps: 4-5 Entrance Stairs-Rails: Left Home Layout: 1/2 bath on main level Alternate Level Stairs-Number of Steps: 12 Alternate Level Stairs-Rails: Left;Right Bathroom Shower/Tub:  Teacher, early years/pre: Standard Bathroom Accessibility: Yes How Accessible: Accessible via walker Home Equipment: [place] - single point;Crutches          Prior Functioning/Environment Level of Independence: Independent             OT Diagnosis: Generalized weakness;Acute pain   OT Problem List: Decreased strength;Decreased range of motion;Impaired balance (sitting and/or standing);Decreased knowledge of use of DME or AE;Decreased knowledge of precautions;Pain   OT Treatment/Interventions: Self-care/ADL training;DME and/or AE instruction;Therapeutic activities;Patient/family education    OT Goals(Current goals can be found in the care plan section) Acute Rehab OT Goals Patient Stated Goal: to go home OT Goal Formulation: With patient Time For Goal Achievement: 10/31/14 Potential to Achieve Goals: Good  OT Frequency: Min 2X/week   Barriers to D/C: Decreased caregiver support (daughter at school during the day)          Co-evaluation              End of Session Equipment Utilized During Treatment: Gait belt;Rolling walker Nurse Communication: Mobility status  Activity Tolerance: Patient tolerated treatment well Patient left: in bed;with call bell/phone within reach   Time: 1452-1508 OT Time Calculation (min): 16 min Charges:  OT General Charges $OT Visit: 1 Procedure OT Evaluation $Initial OT Evaluation Tier I: 1 Procedure OT Treatments $Self Care/Home Management : 8-22 mins G-Codes:    Tasmine Hipwell,HILLARY 11/08/14, 3:36 PM   [facility], OTR/L  [phone] 11/08/2014

## 2014-10-17 NOTE — Progress Notes (Signed)
Physical Therapy Treatment Patient Details Name: [name] MRN: [id number] DOB: 1976-05-17 Today's Date: 10/17/2014    History of Present Illness pt is a 38 y.o. female adm after fall from [employer]; s/p I&D and external fixation placement due to Lt Talus fx.     PT Comments    Making progress with mobility and amb in-room distance with good job keeping NWB RLE; Pt states she has been on crutches before -- will plan to try crutch training either tomorrow or postop;  Pt is requesting a wheelchair, which makes logical sense, being NWB RLE, for community access; Informed pt that it may be private pay for wheelchair;   Will consider knee walker as well as crutches in the next few sessions -- Case Mgmnt: are knee walkers typically covered by insurance these days?   Follow Up Recommendations  No PT follow up;Supervision/Assistance - 24 hour     Equipment Recommendations  Rolling walker with 5" wheels;3in1 (PT) (will also consider crutches vs RW vs knee walker)    Recommendations for Other Services       Precautions / Restrictions Precautions Precautions: Fall Precaution Comments: ext fix in place Restrictions Weight Bearing Restrictions: Yes LLE Weight Bearing: Non weight bearing    Mobility  Bed Mobility Overal bed mobility: Modified Independent                Transfers Overall transfer level: Needs assistance Equipment used: Rolling walker (2 wheeled) Transfers: Sit to/from Stand Sit to Stand: Min guard Stand pivot transfers: Min guard       General transfer comment: cues for hand placement and safety  Ambulation/Gait Ambulation/Gait assistance: Min guard (without physical contact) Ambulation Distance (Feet): 20 Feet (to /from bathroom) Assistive device: Rolling walker (2 wheeled) Gait Pattern/deviations: Step-to pattern     General Gait Details: Good job Oncologist Rankin (Stroke  Patients Only)       Balance Overall balance assessment: Needs assistance   Sitting balance-Leahy Scale: Good       Standing balance-Leahy Scale: Fair Standing balance comment: able to pull up panties and keep NWB safely after using commode                    Cognition Arousal/Alertness: Awake/alert Behavior During Therapy: WFL for tasks assessed/performed Overall Cognitive Status: Within Functional Limits for tasks assessed                      Exercises      General Comments General comments (skin integrity, edema, etc.): fdfd      Pertinent Vitals/Pain Pain Assessment: 0-10 Pain Score: 7  Pain Location: L ankle Pain Descriptors / Indicators: Aching Pain Intervention(s): Limited activity within patient's tolerance;Monitored during session;Repositioned    Home Living Family/patient expects to be discharged to:: Private residence Living Arrangements: Children;Other relatives Available Help at Discharge: Family;Available PRN/intermittently Type of Home: House Home Access: Stairs to enter Entrance Stairs-Rails: Left Home Layout: 1/2 bath on main level Home Equipment: Cane - single point;Crutches      Prior Function Level of Independence: Independent          PT Goals (current goals can now be found in the care plan section) Acute Rehab PT Goals Patient Stated Goal: to go home PT Goal Formulation: With patient Time For Goal Achievement: 10/22/14 Potential to Achieve Goals: Good Progress towards PT  goals: Progressing toward goals    Frequency  Min 5X/week    PT Plan Current plan remains appropriate    Co-evaluation             End of Session   Activity Tolerance: Patient tolerated treatment well Patient left: Other (comment) (with OT)     Time: 1430-1450 PT Time Calculation (min) (ACUTE ONLY): 20 min  Charges:  $Gait Training: 8-22 mins                    G Codes:      Quin Hoop 10/17/2014, 4:12 PM Roney Marion, [place] Pager [phone] Office [phone]

## 2014-10-17 NOTE — Progress Notes (Signed)
   Subjective:  Patient reports pain as mild.    Objective:   VITALS:   Filed Vitals:   10/16/14 4193 10/16/14 0852 10/16/14 2100 10/17/14 0543  BP: 127/72  143/94 161/87  Pulse: 107  110 115  Temp: 98.6 F (37 C)  99 F (37.2 C) 98 F (36.7 C)  TempSrc: Oral  Oral Oral  Resp: 16  16 18   Height:      Weight:      SpO2: 99% 99% 100% 100%    Ex fix intact Trauma wound not actively worse Scant drainage from wound Pin sites hemostatic   Lab Results  Component Value Date   WBC 5.2 10/14/2014   HGB 12.0 10/14/2014   HCT 36.1 10/14/2014   MCV 86.8 10/14/2014   PLT 181 10/14/2014     Assessment/Plan:  3 Days Post-Op   - up with PT - NWB LLE - patient elects to remain here until Wed for surgery with Allayne Gitelman 10/17/2014, 7:53 AM [phone]

## 2014-10-18 ENCOUNTER — Encounter (HOSPITAL_COMMUNITY): Payer: Self-pay | Admitting: General Practice

## 2014-10-18 ENCOUNTER — Other Ambulatory Visit (HOSPITAL_COMMUNITY): Payer: Self-pay | Admitting: Orthopedic Surgery

## 2014-10-18 LAB — SURGICAL PCR SCREEN
MRSA, PCR: NEGATIVE
Staphylococcus aureus: POSITIVE — AB

## 2014-10-18 MED ORDER — CHLORHEXIDINE GLUCONATE 4 % EX LIQD
60.0000 mL | Freq: Once | CUTANEOUS | Status: DC
  Filled 2014-10-18: qty 60

## 2014-10-18 MED ORDER — CEFAZOLIN SODIUM-DEXTROSE 2-3 GM-% IV SOLR
2.0000 g | INTRAVENOUS | Status: DC
  Filled 2014-10-18: qty 50

## 2014-10-18 NOTE — Progress Notes (Signed)
Physical Therapy Treatment Patient Details Name: [name] MRN: [id number] DOB: April 18, 1976 Today's Date: 10/18/2014    History of Present Illness pt is a 38 y.o. female adm after fall from [employer]; s/p I&D and external fixation placement due to Lt Talus fx.     PT Comments    Overall pt maintaining NWB LLE well, especially with RW; will need continued practice with crutches; Anticipate pt will be able to manage well enough postop to be able to dc home with [facility] services  Follow Up Recommendations  HHPT;Supervision/Assistance - 24 hour     Equipment Recommendations  Rolling walker with 5" wheels;3in1 (PT) (will also consider crutches vs RW vs knee walker)    Recommendations for Other Services       Precautions / Restrictions Precautions Precautions: Fall Precaution Comments: ext fix in place Restrictions Weight Bearing Restrictions: Yes LLE Weight Bearing: Non weight bearing    Mobility  Bed Mobility Overal bed mobility: Modified Independent                Transfers Overall transfer level: Needs assistance Equipment used: Rolling walker (2 wheeled) Transfers: Sit to/from Stand Sit to Stand: Min guard         General transfer comment: cues for hand placement and safety  Ambulation/Gait Ambulation/Gait assistance: Min assist Ambulation Distance (Feet): 30 Feet Assistive device: Rolling walker (2 wheeled);Crutches Gait Pattern/deviations: Step-to pattern     General Gait Details: Good job maintianing NWB; less steady with crutches   Stairs            Wheelchair Mobility    Modified Rankin (Stroke Patients Only)       Balance     Sitting balance-Leahy Scale: Good       Standing balance-Leahy Scale: Fair                      Cognition Arousal/Alertness: Awake/alert Behavior During Therapy: WFL for tasks assessed/performed Overall Cognitive Status: Within Functional Limits for tasks assessed                       Exercises      General Comments        Pertinent Vitals/Pain Pain Assessment: 0-10 Pain Score: 5  Pain Location: L ankle Pain Descriptors / Indicators: Aching Pain Intervention(s): Repositioned    Home Living                      Prior Function            PT Goals (current goals can now be found in the care plan section) Acute Rehab PT Goals Patient Stated Goal: to go home PT Goal Formulation: With patient Time For Goal Achievement: 10/22/14 Potential to Achieve Goals: Good Progress towards PT goals: Progressing toward goals    Frequency  Min 5X/week    PT Plan Current plan remains appropriate    Co-evaluation             End of Session Equipment Utilized During Treatment: Gait belt Activity Tolerance: Patient tolerated treatment well Patient left: Other (comment) (with OT)     Time: 9937-1696 PT Time Calculation (min) (ACUTE ONLY): 26 min  Charges:  $Gait Training: 23-37 mins                    G Codes:      Quin Hoop 10/18/2014, 4:24 PM  Roney Marion, PT  Acute Rehabilitation  Services Pager [phone] Office [phone]

## 2014-10-18 NOTE — Progress Notes (Signed)
Patient ID: [name], female   DOB: [birth year], 38 y.o.   MRN: [id number] Patient with severely comminuted talar fracture. Plan for tibial calcaneal fusion tomorrow. Risks and benefits were discussed with the patient risk of avascular necrosis of the talus persistent pain infection need for additional surgery. Patient states she understands wishes to proceed at this time. Patient states that she wants to go to rehabilitation postoperatively. Nothing by mouth after midnight.

## 2014-10-18 NOTE — Plan of Care (Signed)
Problem: Phase I Progression Outcomes Goal: Hemodynamically stable Outcome: Completed/Met Date Met:  10/18/14 Goal: Other Phase I Outcomes/Goals Outcome: Not Applicable Date Met:  10/18/14  Problem: Phase II Progression Outcomes Goal: Bed to chair transfers BID Outcome: Completed/Met Date Met:  10/18/14 Goal: Post op CMS Neurovascular WDL Outcome: Completed/Met Date Met:  10/18/14 Goal: Tolerating diet Outcome: Completed/Met Date Met:  10/18/14 Goal: Discharge plan established Outcome: Completed/Met Date Met:  10/18/14 Goal: Other Phase II Outcomes/Goals Outcome: Not Applicable Date Met:  10/18/14  Problem: Phase III Progression Outcomes Goal: Pain controlled on oral analgesia Outcome: Completed/Met Date Met:  10/18/14     

## 2014-10-19 ENCOUNTER — Encounter (HOSPITAL_COMMUNITY)
Admission: EM | Disposition: A | Discharge status: Home Health | Payer: Self-pay | Source: Home / Self Care | Attending: Orthopedic Surgery

## 2014-10-19 ENCOUNTER — Inpatient Hospital Stay (HOSPITAL_COMMUNITY): Payer: Medicare Other | Admitting: Anesthesiology

## 2014-10-19 ENCOUNTER — Encounter (HOSPITAL_COMMUNITY): Payer: Self-pay | Admitting: Anesthesiology

## 2014-10-19 HISTORY — PX: EXTERNAL FIXATION REMOVAL: SHX5040

## 2014-10-19 HISTORY — PX: ANKLE FUSION: SHX5718

## 2014-10-19 SURGERY — REMOVAL, EXTERNAL FIXATION DEVICE, LOWER EXTREMITY
Anesthesia: General | Site: Leg Lower | Laterality: Left

## 2014-10-19 MED ORDER — 0.9 % SODIUM CHLORIDE (POUR BTL) OPTIME
TOPICAL | Status: DC | PRN
  Administered 2014-10-19: 1000 mL

## 2014-10-19 MED ORDER — GLYCOPYRROLATE 0.2 MG/ML IJ SOLN
INTRAMUSCULAR | Status: DC | PRN
  Administered 2014-10-19: 0.4 mg via INTRAVENOUS

## 2014-10-19 MED ORDER — FENTANYL CITRATE 0.05 MG/ML IJ SOLN
INTRAMUSCULAR | Status: DC | PRN
  Administered 2014-10-19: 50 ug via INTRAVENOUS
  Administered 2014-10-19: 100 ug via INTRAVENOUS
  Administered 2014-10-19 (×5): 50 ug via INTRAVENOUS

## 2014-10-19 MED ORDER — METOCLOPRAMIDE HCL 5 MG/ML IJ SOLN: 5.0000 mg | Freq: Three times a day (TID) | INTRAMUSCULAR | Status: DC | PRN

## 2014-10-19 MED ORDER — DEXAMETHASONE SODIUM PHOSPHATE 10 MG/ML IJ SOLN
INTRAMUSCULAR | Status: AC
  Filled 2014-10-19: qty 1

## 2014-10-19 MED ORDER — HYDROMORPHONE HCL 1 MG/ML IJ SOLN
0.2500 mg | INTRAMUSCULAR | Status: DC | PRN
  Administered 2014-10-19 (×4): 0.5 mg via INTRAVENOUS

## 2014-10-19 MED ORDER — ONDANSETRON HCL 4 MG/2ML IJ SOLN
INTRAMUSCULAR | Status: DC | PRN
  Administered 2014-10-19: 4 mg via INTRAVENOUS

## 2014-10-19 MED ORDER — ONDANSETRON HCL 4 MG/2ML IJ SOLN
INTRAMUSCULAR | Status: AC
  Filled 2014-10-19: qty 2

## 2014-10-19 MED ORDER — ONDANSETRON HCL 4 MG PO TABS: 4.0000 mg | ORAL_TABLET | Freq: Four times a day (QID) | ORAL | Status: DC | PRN

## 2014-10-19 MED ORDER — LACTATED RINGERS IV SOLN
INTRAVENOUS | Status: DC | PRN
  Administered 2014-10-19: 13:00:00 via INTRAVENOUS

## 2014-10-19 MED ORDER — DOCUSATE SODIUM 100 MG PO CAPS
100.0000 mg | ORAL_CAPSULE | Freq: Two times a day (BID) | ORAL | Status: DC
  Administered 2014-10-19 – 2014-10-23 (×9): 100 mg via ORAL
  Filled 2014-10-19 (×11): qty 1

## 2014-10-19 MED ORDER — LIDOCAINE HCL (CARDIAC) 20 MG/ML IV SOLN
INTRAVENOUS | Status: DC | PRN
  Administered 2014-10-19: 80 mg via INTRAVENOUS

## 2014-10-19 MED ORDER — HYDROMORPHONE HCL 1 MG/ML IJ SOLN
INTRAMUSCULAR | Status: AC
  Filled 2014-10-19: qty 1

## 2014-10-19 MED ORDER — OXYCODONE-ACETAMINOPHEN 5-325 MG PO TABS
1.0000 | ORAL_TABLET | ORAL | Status: DC | PRN
  Administered 2014-10-21 – 2014-10-24 (×7): 2 via ORAL
  Filled 2014-10-19 (×7): qty 2

## 2014-10-19 MED ORDER — ASPIRIN EC 325 MG PO TBEC
325.0000 mg | DELAYED_RELEASE_TABLET | Freq: Every day | ORAL | Status: DC
  Administered 2014-10-20 – 2014-10-23 (×4): 325 mg via ORAL
  Filled 2014-10-19 (×7): qty 1

## 2014-10-19 MED ORDER — METOCLOPRAMIDE HCL 10 MG PO TABS: 5.0000 mg | ORAL_TABLET | Freq: Three times a day (TID) | ORAL | Status: DC | PRN

## 2014-10-19 MED ORDER — METHOCARBAMOL 500 MG PO TABS
500.0000 mg | ORAL_TABLET | Freq: Four times a day (QID) | ORAL | Status: DC | PRN
  Administered 2014-10-20 – 2014-10-23 (×10): 500 mg via ORAL
  Filled 2014-10-19 (×12): qty 1

## 2014-10-19 MED ORDER — NEOSTIGMINE METHYLSULFATE 10 MG/10ML IV SOLN
INTRAVENOUS | Status: DC | PRN
  Administered 2014-10-19: 3 mg via INTRAVENOUS

## 2014-10-19 MED ORDER — DEXAMETHASONE SODIUM PHOSPHATE 10 MG/ML IJ SOLN
INTRAMUSCULAR | Status: DC | PRN
  Administered 2014-10-19: 10 mg via INTRAVENOUS

## 2014-10-19 MED ORDER — FENTANYL CITRATE 0.05 MG/ML IJ SOLN
INTRAMUSCULAR | Status: AC
Start: 2014-10-19 — End: 2014-10-19
  Filled 2014-10-19: qty 5

## 2014-10-19 MED ORDER — ONDANSETRON HCL 4 MG/2ML IJ SOLN: 4.0000 mg | Freq: Four times a day (QID) | INTRAMUSCULAR | Status: DC | PRN

## 2014-10-19 MED ORDER — CEFAZOLIN SODIUM 1-5 GM-% IV SOLN: 1.0000 g | Freq: Four times a day (QID) | INTRAVENOUS | Status: DC

## 2014-10-19 MED ORDER — SODIUM CHLORIDE 0.9 % IV SOLN
INTRAVENOUS | Status: DC
  Administered 2014-10-20: 05:00:00 via INTRAVENOUS

## 2014-10-19 MED ORDER — HYDROMORPHONE HCL 1 MG/ML IJ SOLN
0.5000 mg | INTRAMUSCULAR | Status: DC | PRN
  Administered 2014-10-19 – 2014-10-21 (×5): 1 mg via INTRAVENOUS
  Filled 2014-10-19 (×5): qty 1

## 2014-10-19 MED ORDER — PROPOFOL 10 MG/ML IV BOLUS
INTRAVENOUS | Status: AC
  Filled 2014-10-19: qty 20

## 2014-10-19 MED ORDER — PHENYLEPHRINE HCL 10 MG/ML IJ SOLN
INTRAMUSCULAR | Status: DC | PRN
  Administered 2014-10-19: 80 ug via INTRAVENOUS

## 2014-10-19 MED ORDER — CEFAZOLIN SODIUM-DEXTROSE 2-3 GM-% IV SOLR
INTRAVENOUS | Status: DC | PRN
  Administered 2014-10-19: 2 g via INTRAVENOUS

## 2014-10-19 MED ORDER — MIDAZOLAM HCL 2 MG/2ML IJ SOLN
INTRAMUSCULAR | Status: AC
  Filled 2014-10-19: qty 2

## 2014-10-19 MED ORDER — LACTATED RINGERS IV SOLN
INTRAVENOUS | Status: DC
  Administered 2014-10-19: 13:00:00 via INTRAVENOUS

## 2014-10-19 MED ORDER — ROCURONIUM BROMIDE 100 MG/10ML IV SOLN
INTRAVENOUS | Status: DC | PRN
  Administered 2014-10-19: 25 mg via INTRAVENOUS

## 2014-10-19 MED ORDER — LIDOCAINE HCL 4 % MT SOLN
OROMUCOSAL | Status: DC | PRN
  Administered 2014-10-19: 4 mL via TOPICAL

## 2014-10-19 MED ORDER — MIDAZOLAM HCL 5 MG/5ML IJ SOLN
INTRAMUSCULAR | Status: DC | PRN
  Administered 2014-10-19: 2 mg via INTRAVENOUS

## 2014-10-19 MED ORDER — PHENYLEPHRINE 40 MCG/ML (10ML) SYRINGE FOR IV PUSH (FOR BLOOD PRESSURE SUPPORT)
PREFILLED_SYRINGE | INTRAVENOUS | Status: AC
  Filled 2014-10-19: qty 10

## 2014-10-19 MED ORDER — METHOCARBAMOL 1000 MG/10ML IJ SOLN
500.0000 mg | Freq: Four times a day (QID) | INTRAVENOUS | Status: DC | PRN
  Filled 2014-10-19: qty 5

## 2014-10-19 MED ORDER — PROPOFOL 10 MG/ML IV BOLUS
INTRAVENOUS | Status: DC | PRN
  Administered 2014-10-19: 200 mg via INTRAVENOUS

## 2014-10-19 SURGICAL SUPPLY — 48 items
BANDAGE ESMARK 6X9 LF (GAUZE/BANDAGES/DRESSINGS) ×1 IMPLANT
BIT DRILL CANNULATED 13.0X300M (BIT) IMPLANT
BLADE SAW SGTL HD 18.5X60.5X1. (BLADE) ×3 IMPLANT
BLADE SURG 10 STRL SS (BLADE) IMPLANT
BNDG CMPR 9X6 STRL LF SNTH (GAUZE/BANDAGES/DRESSINGS)
BNDG COHESIVE 4X5 TAN STRL (GAUZE/BANDAGES/DRESSINGS) ×3 IMPLANT
BNDG ESMARK 6X9 LF (GAUZE/BANDAGES/DRESSINGS)
BNDG GAUZE ELAST 4 BULKY (GAUZE/BANDAGES/DRESSINGS) ×6 IMPLANT
CAP END 0MM TI T25 STARDV (Cap) IMPLANT
COVER MAYO STAND STRL (DRAPES) IMPLANT
COVER SURGICAL LIGHT HANDLE (MISCELLANEOUS) ×3 IMPLANT
DRAPE INCISE IOBAN 66X45 STRL (DRAPES) ×3 IMPLANT
DRAPE OEC MINIVIEW 54X84 (DRAPES) IMPLANT
DRAPE U-SHAPE 47X51 STRL (DRAPES) ×3 IMPLANT
DRILL BIT CANNULATED 13.0X300M (BIT) ×3
DRSG ADAPTIC 3X8 NADH LF (GAUZE/BANDAGES/DRESSINGS) ×3 IMPLANT
DRSG MEPILEX BORDER 4X4 (GAUZE/BANDAGES/DRESSINGS) ×4 IMPLANT
DRSG VAC ATS SM SENSATRAC (GAUZE/BANDAGES/DRESSINGS) ×2 IMPLANT
DURAPREP 26ML APPLICATOR (WOUND CARE) ×3 IMPLANT
ELECT REM PT RETURN 9FT ADLT (ELECTROSURGICAL) ×3
ELECTRODE REM PT RTRN 9FT ADLT (ELECTROSURGICAL) ×1 IMPLANT
ENDCAP TI (Cap) ×2 IMPLANT
GAUZE SPONGE 4X4 12PLY STRL (GAUZE/BANDAGES/DRESSINGS) ×3 IMPLANT
GLOVE BIOGEL PI IND STRL 9 (GLOVE) ×1 IMPLANT
GLOVE BIOGEL PI INDICATOR 9 (GLOVE) ×2
GLOVE SURG ORTHO 9.0 STRL STRW (GLOVE) ×3 IMPLANT
GOWN STRL REUS W/ TWL XL LVL3 (GOWN DISPOSABLE) ×3 IMPLANT
GOWN STRL REUS W/TWL XL LVL3 (GOWN DISPOSABLE) ×9
GUIDEWIRE 3.2X290 (WIRE) ×2 IMPLANT
KIT BASIN OR (CUSTOM PROCEDURE TRAY) ×3 IMPLANT
KIT ROOM TURNOVER OR (KITS) ×3 IMPLANT
NAIL HINDFOOT TI 10X150MM (Nail) ×2 IMPLANT
NS IRRIG 1000ML POUR BTL (IV SOLUTION) ×3 IMPLANT
PACK ORTHO EXTREMITY (CUSTOM PROCEDURE TRAY) ×3 IMPLANT
PAD ARMBOARD 7.5X6 YLW CONV (MISCELLANEOUS) ×6 IMPLANT
REAMER ROD DEEP FLUTE 2.5X950 (INSTRUMENTS) ×2 IMPLANT
SCREW LOCK STAR 5X30 (Screw) ×2 IMPLANT
SCREW LOCK STAR 5X72 (Screw) ×2 IMPLANT
SCREW LOCK STAR 6X68 (Screw) ×2 IMPLANT
SCREW LOCK STAR 6X72 (Screw) ×2 IMPLANT
SPONGE LAP 18X18 X RAY DECT (DISPOSABLE) ×3 IMPLANT
SUCTION FRAZIER TIP 10 FR DISP (SUCTIONS) ×3 IMPLANT
SUT ETHILON 2 0 PSLX (SUTURE) ×9 IMPLANT
TOWEL OR 17X24 6PK STRL BLUE (TOWEL DISPOSABLE) ×3 IMPLANT
TOWEL OR 17X26 10 PK STRL BLUE (TOWEL DISPOSABLE) ×3 IMPLANT
TUBE CONNECTING 12'X1/4 (SUCTIONS) ×1
TUBE CONNECTING 12X1/4 (SUCTIONS) ×2 IMPLANT
WATER STERILE IRR 1000ML POUR (IV SOLUTION) ×3 IMPLANT

## 2014-10-19 NOTE — Anesthesia Postprocedure Evaluation (Signed)
  Anesthesia Post-op Note  Patient: [name]  Procedure(s) Performed: Procedure(s): REMOVAL EXTERNAL FIXATION LEG (Left) Tibiocalcaneal Fusion (Left)  Patient Location: PACU  Anesthesia Type:General  Level of Consciousness: awake  Airway and Oxygen Therapy: Patient Spontanous Breathing  Post-op Pain: mild  Post-op Assessment: Post-op Vital signs reviewed  Post-op Vital Signs: Reviewed  Last Vitals:  Filed Vitals:   10/19/14 1625  BP: 134/81  Pulse: 91  Temp: 36.8 C  Resp: 14    Complications: No apparent anesthesia complications

## 2014-10-19 NOTE — H&P (View-Only) (Signed)
Patient ID: [name], female   DOB: July 12, 1976, 38 y.o.   MRN: [id number] Patient with severely comminuted talar fracture. Plan for tibial calcaneal fusion tomorrow. Risks and benefits were discussed with the patient risk of avascular necrosis of the talus persistent pain infection need for additional surgery. Patient states she understands wishes to proceed at this time. Patient states that she wants to go to rehabilitation postoperatively. Nothing by mouth after midnight.

## 2014-10-19 NOTE — Interval H&P Note (Signed)
History and Physical Interval Note:  10/19/2014 7:06 AM  [name]  has presented today for surgery, with the diagnosis of Open Left Talar Fracture  The various methods of treatment have been discussed with the patient and family. After consideration of risks, benefits and other options for treatment, the patient has consented to  Procedure(s): REMOVAL EXTERNAL FIXATION LEG (Left) Tibiocalcaneal Fusion (Left) as a surgical intervention .  The patient's history has been reviewed, patient examined, no change in status, stable for surgery.  I have reviewed the patient's chart and labs.  Questions were answered to the patient's satisfaction.     DUDA,MARCUS V

## 2014-10-19 NOTE — Op Note (Signed)
10/14/2014 - 10/19/2014  2:22 PM  PATIENT:  [name]    PRE-OPERATIVE DIAGNOSIS:  Open Left Talar Fracture status post irrigation and debridement and external fixation  POST-OPERATIVE DIAGNOSIS:  Same  PROCEDURE:  REMOVAL EXTERNAL FIXATION LEG,  Tibiocalcaneal Fusion with a Synthes 10 x 115 mm nail. Local tissue rearrangement for wound closure 7 x 3 cm. Application wound VAC.  SURGEON:  Newt Minion, MD  PHYSICIAN ASSISTANT:None ANESTHESIA:   General  PREOPERATIVE INDICATIONS:  [name] is a  38 y.o. female with a diagnosis of Open Left Talar Fracture who failed conservative measures and elected for surgical management.    The risks benefits and alternatives were discussed with the patient preoperatively including but not limited to the risks of infection, bleeding, nerve injury, cardiopulmonary complications, the need for revision surgery, among others, and the patient was willing to proceed.  OPERATIVE IMPLANTS: Wound VAC and Synthes tibial calcaneal nail  OPERATIVE FINDINGS: Ischemic changes to the inferior flap of the traumatic lateral wound with complete loss of any attachment to the talar body  OPERATIVE PROCEDURE: Patient was brought to the operating room and underwent a general anesthetic. After adequate levels and anesthesia obtained patient was placed in the right lateral decubitus position with the left side up. The external fixator was removed and the left lower extremity was first prepped using Betadine paint and dried and then prepped using DuraPrep draped into a sterile field. The traumatic wound laterally was excised of the necrotic tissue. This requires excision of about 7 cm in length 1 cm in width across the wound. The talar body was identified and was completely devoid of any attachments no circulation the bone was completely dead. This area was irrigated with normal saline and a saw was then used to remove the distal aspect the articular surface of the  tibia perpendicular to the long axis. A guidewire was inserted from the calcaneus up into the tibia this was overreamed and a tibial calcaneal nail was placed. This was locked distally with 2 screws in the calcaneus and an oblique screw going down into the calcaneus as well. This was then locked proximally after the fusion site was compressed. C-arm fluoroscopy verified alignment. The wound was again irrigated with normal saline. The traumatic wound the local tissue is rearrangement for wound closure 7 x 3 cm. A wound VAC was applied this had a good suction fit patient was then extubated taken to the PACU in stable condition.

## 2014-10-19 NOTE — Transfer of Care (Signed)
Immediate Anesthesia Transfer of Care Note  Patient: [name]  Procedure(s) Performed: Procedure(s): REMOVAL EXTERNAL FIXATION LEG (Left) Tibiocalcaneal Fusion (Left)  Patient Location: PACU  Anesthesia Type:General  Level of Consciousness: awake, alert , oriented and patient cooperative  Airway & Oxygen Therapy: Patient Spontanous Breathing and Patient connected to nasal cannula oxygen  Post-op Assessment: Report given to PACU RN and Post -op Vital signs reviewed and stable  Post vital signs: Reviewed and stable  Complications: No apparent anesthesia complications

## 2014-10-19 NOTE — Anesthesia Preprocedure Evaluation (Addendum)
Anesthesia Evaluation  Patient identified by MRN, date of birth, ID band Patient awake    Reviewed: Allergy & Precautions, H&P , NPO status , Patient's Chart, lab work & pertinent test results, reviewed documented beta blocker date and time   History of Anesthesia Complications Negative for: history of anesthetic complications  Airway Mallampati: II  TM Distance: >3 FB Neck ROM: Full    Dental  (+) Teeth Intact, Dental Advisory Given   Pulmonary shortness of breath and with exertion, asthma , sleep apnea , Current Smoker,  breath sounds clear to auscultation        Cardiovascular negative cardio ROS  Rhythm:Regular Rate:Normal     Neuro/Psych  Headaches, Depression    GI/Hepatic negative GI ROS, Neg liver ROS,   Endo/Other  negative endocrine ROS  Renal/GU negative Renal ROS     Musculoskeletal negative musculoskeletal ROS (+)   Abdominal   Peds  Hematology  (+) anemia ,   Anesthesia Other Findings   Reproductive/Obstetrics negative OB ROS                           Anesthesia Physical Anesthesia Plan  ASA: III  Anesthesia Plan: General   Post-op Pain Management:    Induction: Intravenous  Airway Management Planned: LMA  Additional Equipment:   Intra-op Plan:   Post-operative Plan: Extubation in OR  Informed Consent: I have reviewed the patients History and Physical, chart, labs and discussed the procedure including the risks, benefits and alternatives for the proposed anesthesia with the patient or authorized representative who has indicated his/her understanding and acceptance.     Plan Discussed with: Anesthesiologist, CRNA and Surgeon  Anesthesia Plan Comments:         Anesthesia Quick Evaluation

## 2014-10-20 NOTE — Progress Notes (Signed)
Physical Therapy Treatment Patient Details Name: [name] MRN: [id number] DOB: [birth year] Today's Date: 10/20/2014    History of Present Illness pt is a 38 y.o. female adm after fall from [employer]; s/p I&D and external fixation placement due to Lt Talus fx.     PT Comments    Pt POD#1 after tibial-calcaneal fusion. All goals remain appropriate. Wound VAC in place on Lt LE. Pt mobilizing and maintaining NWB status well, pt with single instance of LOB, recovered with min guard. Pt (A) to bathroom. Pt able to perform hygiene at supervision level with use of RW. Cont to follow per POC. Pt hopeful to D/C home Sunday. Patient needs to practice stairs next session, prior to D/C home.   Follow Up Recommendations  Home health PT;Supervision - Intermittent (pt requesting information regarding [place] )     Equipment Recommendations  Rolling walker with 5" wheels;3in1 (PT);Wheelchair (measurements PT);Wheelchair cushion (measurements PT) (elevated leg rest)    Recommendations for Other Services       Precautions / Restrictions Precautions Precautions: Fall Precaution Comments: wound VAC in place Restrictions Weight Bearing Restrictions: Yes LLE Weight Bearing: Non weight bearing    Mobility  Bed Mobility Overal bed mobility: Modified Independent             General bed mobility comments: pt able to lift Lt LE off pillows and bring off EOB and back into bed with incr time due to pain  Transfers Overall transfer level: Needs assistance Equipment used: Rolling walker (2 wheeled) Transfers: Sit to/from Stand Sit to Stand: Min guard         General transfer comment: min guard to steady; cues for safety and hand placement with use of RW; pt demo good ability to maintain NWB status   Ambulation/Gait Ambulation/Gait assistance: Min guard Ambulation Distance (Feet): 80 Feet Assistive device: Rolling walker (2 wheeled) Gait Pattern/deviations: Step-to pattern Gait  velocity: impulsive at times; cues for safe gt speed Gait velocity interpretation: Below normal speed for age/gender General Gait Details: pt does well at maintaining NWB status; had 1 instance of LOB recovered with min guard and cues ; cues for safety with RW   Stairs            Wheelchair Mobility    Modified Rankin (Stroke Patients Only)       Balance Overall balance assessment: Needs assistance Sitting-balance support: Feet supported;No upper extremity supported Sitting balance-Leahy Scale: Normal     Standing balance support: During functional activity;Bilateral upper extremity supported Standing balance-Leahy Scale: Fair Standing balance comment: pt able to perform pericare at her [facility] without (A) ; supervision for safety                     Cognition Arousal/Alertness: Awake/alert Behavior During Therapy: WFL for tasks assessed/performed Overall Cognitive Status: Within Functional Limits for tasks assessed                      Exercises      General Comments General comments (skin integrity, edema, etc.): encouraged elevation for edema and pain control       Pertinent Vitals/Pain Pain Assessment: 0-10 Pain Score: 9  Pain Location: Lt ankle Pain Descriptors / Indicators: Tingling Pain Intervention(s): Monitored during session;Premedicated before session;Repositioned    Home Living                      Prior Function  PT Goals (current goals can now be found in the care plan section) Acute Rehab PT Goals Patient Stated Goal: to go home sunday PT Goal Formulation: With patient Time For Goal Achievement: 10/22/14 Progress towards PT goals: Progressing toward goals    Frequency  Min 5X/week    PT Plan Current plan remains appropriate    Co-evaluation             End of Session Equipment Utilized During Treatment: Gait belt Activity Tolerance: Patient tolerated treatment well Patient left: in bed;with  call bell/phone within reach     Time: 1350-1414 PT Time Calculation (min) (ACUTE ONLY): 24 min  Charges:  $Gait Training: 8-22 mins $Therapeutic Activity: 8-22 mins                    G CodesGustavus Bryant, [place] 10/20/2014, 3:20 PM

## 2014-10-20 NOTE — Progress Notes (Signed)
Patient ID: [name], female   DOB: [birth year], 38 y.o.   MRN: [id number] Postoperative day 1 tibial calcaneal fusion on the left. 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 It was completely stripped of all attachment. Patient also had ischemic changes around her traumatic wound and I am still concerned of the viability of the inferior aspect of the soft tissue flap. There is good drainage from the wound VAC.  Will have the patient continue with the wound VAC through Sunday I will change the wound VAC at that time evaluate for discharge to home on Sunday. Patient will need durable medical equipment including a wheelchair for discharge to home with home health physical therapy.  Physical therapy progressive ambulation nonweightbearing on the left. Patient should wear a fracture boot for gait training.

## 2014-10-20 NOTE — Plan of Care (Signed)
Problem: Phase III Progression Outcomes Goal: Maintains weight - bearing per MD orders Outcome: Completed/Met Date Met:  10/20/14

## 2014-10-21 ENCOUNTER — Encounter (HOSPITAL_COMMUNITY): Payer: Self-pay | Admitting: Orthopedic Surgery

## 2014-10-21 LAB — CREATININE, SERUM
Creatinine, Ser: 0.72 mg/dL (ref 0.50–1.10)
GFR calc Af Amer: >90 mL/min (ref >90)
GFR calc non Af Amer: >90 mL/min (ref >90)

## 2014-10-21 NOTE — Progress Notes (Addendum)
Occupational Therapy Treatment Patient Details Name: [name] MRN: [id number] DOB: 1976-07-11 Today's Date: 10/21/2014    History of present illness pt is a 38 y.o. female adm after fall from [employer]; s/p I&D and external fixation placement due to Lt Talus fx.    OT comments  Pt. Eager for participation in skilled OT.  Progressing well.  Able to demonstrate safe technique with UB/LB B/D and self care tasks.  Maintains NWB status without cues.  S level at this time and will have intermittent family assist at D/C.  Pt. Is ready to d/c from OT.  Evaluating therapist notified of D/C from OT.    Follow Up Recommendations  No OT follow up;Supervision - Intermittent    Equipment Recommendations  3 in 1 bedside comode    Recommendations for Other Services      Precautions / Restrictions Precautions Precautions: Fall Precaution Comments: wound VAC in place Restrictions LLE Weight Bearing: Non weight bearing       Mobility Bed Mobility Overal bed mobility: Modified Independent                Transfers Overall transfer level: Needs assistance Equipment used: Rolling walker (2 wheeled) Transfers: Sit to/from [facility] Sit to Stand: Supervision Stand pivot transfers: Supervision       General transfer comment: demonstrates safe technique    Balance                                   ADL Overall ADL's : Needs assistance/impaired     Grooming: Wash/dry hands;Wash/dry face;Applying deodorant;Brushing hair;Set up;Sitting   Upper Body Bathing: Set up;Supervision/ safety;Sitting   Lower Body Bathing: Supervison/ safety;Set up;Sitting/lateral leans;Sit to/from stand   Upper Body Dressing : Set up;Sitting   Lower Body Dressing: Set up;Sitting/lateral leans Lower Body Dressing Details (indicate cue type and reason): donned and doff R sock with set up/S in sitting, also able to reach L foot if needed but did not don underwear secondary  to wound vac in place Toilet Transfer: Supervision/safety;BSC;RW   Toileting- Clothing Manipulation and Hygiene: Modified independent;Sitting/lateral lean       Functional mobility during ADLs: Set up;Supervision/safety;Rolling walker General ADL Comments: transferring well with RW.  demonstrates safe technique while maintianing nwb status.  completed ub/lb self care with S.        Vision                     Perception     Praxis      Cognition   Behavior During Therapy: [facility] for tasks assessed/performed Overall Cognitive Status: Within Functional Limits for tasks assessed                       Extremity/Trunk Assessment               Exercises     Shoulder Instructions       General Comments      Pertinent Vitals/ Pain       Pain Assessment: No/denies pain  Home Living                                          Prior Functioning/Environment              Frequency Min 2X/week  Progress Toward Goals  OT Goals(current goals can now be found in the care plan section)  Progress towards OT goals: Progressing toward goals     Plan Discharge plan remains appropriate    Co-evaluation                 End of Session Equipment Utilized During Treatment: Rolling walker   Activity Tolerance Patient tolerated treatment well   Patient Left in bed;with call bell/phone within reach   Nurse Communication Mobility status        Time: 1638-4536 OT Time Calculation (min): 30 min  Charges: OT General Charges $OT Visit: 1 Procedure OT Treatments $Self Care/Home Management : 23-37 mins  Janice Coffin, COTA/L 10/21/2014, 9:07 AM

## 2014-10-21 NOTE — Progress Notes (Signed)
Physical Therapy Treatment Patient Details Name: [name] MRN: [id number] DOB: January 23, 1976 Today's Date: 10/21/2014    History of Present Illness pt is a 38 y.o. female adm after fall from [employer]; s/p I&D and external fixation placement due to Lt Talus fx.     PT Comments    Pt making good progress with mobility.  Performed mobility on knee scooter today per pt request.  She requires mod to max A HOH cues for steering scooter, but overall requires only min/guard to close S.  Discussed that insurance does not cover this, but she did want more information.  Performed 2, 6" steps with single R rail and single crutch.  Requires mod A and cues for sequencing and technique.  Discussed having her friend that is helping her home come in tomorrow or prior to D/C on Sunday for hands on training.  Pt states she would ask friend today.  Also discussed need for hospital bed on main level to avoid having to go up/down stairs unless she is able to bump up on her buttocks.  Pt requests hospital bed.   FYI:  Had L CAM boot delivered to room.  Boot was medium and did not fit over edematous leg at this time.  RN notified and also questioned whether she needed boot while she is NWB to avoid having to carry extra weight.  Please confirm need for CAM boot during mobility.     Follow Up Recommendations  Home health PT;Supervision - Intermittent (requesting [place] RN)     Equipment Recommendations  Rolling walker with 5" wheels;3in1 (PT);Wheelchair (measurements PT);Wheelchair cushion (measurements PT);Hospital bed (elevating LLE leg rest)    Recommendations for Other Services OT consult     Precautions / Restrictions Precautions Precautions: Fall Precaution Comments: wound VAC in place Required Braces or Orthoses: Other Brace/Splint Other Brace/Splint: Has CAM boot for RLE Restrictions Weight Bearing Restrictions: Yes LLE Weight Bearing: Non weight bearing Other Position/Activity Restrictions: CAM  boot delivered prior to session for LLE, however if NWB, will need to find out if she needs to don this with gait, as it will just be extra weight.  RN aware and attempting to clarify.      Mobility  Bed Mobility Overal bed mobility: Modified Independent             General bed mobility comments: pt able to lift Lt LE off pillows and bring off EOB and back into bed with incr time due to pain  Transfers Overall transfer level: Needs assistance Equipment used:  (knee scooter) Transfers: Sit to/from Stand Sit to Stand: Supervision Stand pivot transfers: Supervision       General transfer comment: Pt S for standing in order to pull up pants and S for stand pivot transfer to 3in1.  Assisted with min A to knee scooter for increased safety.   Ambulation/Gait Ambulation/Gait assistance: Min guard;Min assist Ambulation Distance (Feet): 150 Feet (on knee scooter) Assistive device:  (knee scooter) Gait Pattern/deviations: Step-to pattern;Trunk flexed Gait velocity: Pt continues to be somewhat impulsive with movement, however was able to manage well on scooter in straight path.  Needed HOH cues for steering scooter with short turns.    General Gait Details: Provided pt with cues for upright posture and cues for how to steer scooter, esp in tight spaces and how to pull scooter up to seating surface to prevent liklihood of falling.    Stairs Stairs: Yes Stairs assistance: Mod assist Stair Management: One rail Right;With crutches;Forwards;Step to  pattern (single crutch) Number of Stairs: 2 General stair comments: Pt requires demonstration and verbal cues for correct stepping sequence with single crutch and rail.  Requires mod A to steady and recommend that she not perform in this manner to get to second floor.  May be able to bump up on buttocks or pt requesting hospital bed.   Wheelchair Mobility    Modified Rankin (Stroke Patients Only)       Balance Overall balance assessment:  Needs assistance Sitting-balance support: Feet supported Sitting balance-Leahy Scale: Normal     Standing balance support: Single extremity supported;During functional activity Standing balance-Leahy Scale: Fair Standing balance comment: S for safety                    Cognition Arousal/Alertness: Awake/alert Behavior During Therapy: WFL for tasks assessed/performed Overall Cognitive Status: Within Functional Limits for tasks assessed                      Exercises      General Comments        Pertinent Vitals/Pain Pain Assessment: 0-10 Pain Score: 5  Pain Location: L ankle Pain Descriptors / Indicators: Aching Pain Intervention(s): RN gave pain meds during session;Repositioned    Home Living                      Prior Function            PT Goals (current goals can now be found in the care plan section) Acute Rehab PT Goals Patient Stated Goal: to go home sunday PT Goal Formulation: With patient Time For Goal Achievement: 10/22/14 Potential to Achieve Goals: Good Progress towards PT goals: Progressing toward goals    Frequency  Min 5X/week    PT Plan Current plan remains appropriate    Co-evaluation             End of Session Equipment Utilized During Treatment: Other (comment) (CAM boot on RLE) Activity Tolerance: Patient tolerated treatment well Patient left: in bed;with call bell/phone within reach     Time: 1031-1117 PT Time Calculation (min) (ACUTE ONLY): 46 min  Charges:  $Gait Training: 23-37 mins $Therapeutic Activity: 8-22 mins                    G Codes:      Denice Bors 10/21/2014, 11:31 AM

## 2014-10-21 NOTE — Progress Notes (Signed)
Orthopedic Tech Progress Note Patient Details:  [name] 26-Apr-1976 [id number]  Ortho Devices Type of Ortho Device: CAM walker Ortho Device/Splint Location: LLE Ortho Device/Splint Interventions: Ordered, Application   Braulio Bosch 10/21/2014, 10:33 AM

## 2014-10-21 NOTE — Care Management Note (Signed)
Date Additional Medicare IM given:  10/21/2014 Additional Medicare IM given by:  Norina Buzzard

## 2014-10-21 NOTE — Plan of Care (Signed)
Problem: Acute Rehab OT Goals (only OT should resolve) Goal: Pt. Will Perform Lower Body Bathing Outcome: Adequate for Discharge Goal: Pt. Will Perform Lower Body Dressing Outcome: Adequate for Discharge Goal: Pt. Will Transfer To Toilet Outcome: Adequate for Discharge

## 2014-10-22 MED ORDER — MAGNESIUM CITRATE PO SOLN
0.5000 | Freq: Once | ORAL | Status: AC
  Administered 2014-10-22: 0.5 via ORAL
  Filled 2014-10-22: qty 296

## 2014-10-22 NOTE — Progress Notes (Signed)
Pt stable - pain mod well controlled Wound vac in place persistent vac drainage present Plan eval by Sharol Given am wound vac change possible dc

## 2014-10-22 NOTE — Care Management Note (Unsigned)
    Page 1 of 2   10/22/2014     10:18:06 AM CARE MANAGEMENT NOTE 10/22/2014  Patient:  [name],[name]   Account Number:  [id number]  Date Initiated:  10/21/2014  Documentation initiated by:  [place] Plan  Subjective/Objective Assessment:   38 yo admitted with a L ankle injury after falling from a 2nd story ledge. She had an I & D & external fixation placement due to L Talus fx.     Action/Plan:   PT/OT evals-recommended HHPT    DME   Anticipated DC Date:  10/23/2014   Anticipated DC Plan:  [place]  CM consult      PAC Choice  [place]   Choice offered to / List presented to:  C-1 Patient   DME arranged  [place]      DME agency  [place] arranged  [place].   Status of service:  In process, will continue to follow Medicare Important Message given?   (If response is "NO", the following Medicare IM given date fields will be blank) Date Medicare IM given:   Medicare IM given by:   Date Additional Medicare IM given:  10/21/2014 Additional Medicare IM given by:  Norina Buzzard  Discharge Disposition:  [place]  Per UR Regulation:    If discussed at Long Length of Stay Meetings, dates discussed:    Comments:  10/22/14 0945 - Frann Rider, RN, BSN  Met with pt and daughter. Pt has the support of her daughter who goes to school and she is requesting a [place] aide. Contacted Jeneen Rinks at [facility] for DME and [place] for [facility] aide.    10/21/14 Spoke with patient on 10/20/14 about HHC. She chose [facility]. Contacted [facility] and set up HHPT.

## 2014-10-22 NOTE — Progress Notes (Signed)
Physical Therapy Treatment Patient Details Name: [name] MRN: [id number] DOB: October 31, 1976 Today's Date: 10/22/2014    History of Present Illness pt is a 38 y.o. female adm after fall from [employer]; s/p I&D and external fixation placement due to Lt Talus fx.     PT Comments    Patient willing for therapy session, trial of axillary crutches for stair use and ambulation.  Min assist with crutches vs Supervision with rolling 2 wheeled walker.  Patient fatigued with few near losses of balance after crutches trial, unable to perform stairs with return demonstration.  Instructed patient on technique for crutches use and for scooting up on buttocks, and deferred future training to [place] [facility] or potentially PT services tomorrow before returning home.  Patient much better with RW for stability, still no answer on Bentley LE cam boot, unable to fit due to Bentley LE edema at this time.   Follow Up Recommendations  Home health PT;Supervision - Intermittent (requesting [place] RN)     Equipment Recommendations  Rolling walker with 5" wheels;3in1 (PT);Wheelchair (measurements PT);Wheelchair cushion (measurements PT);Hospital bed (elevating LLE leg rest)    Recommendations for Other Services OT consult     Precautions / Restrictions Precautions Precautions: Fall Precaution Comments: wound VAC in place Required Braces or Orthoses: Other Brace/Splint Other Brace/Splint: Has CAM boot for Bentley LE (unable to fit at this time, maintaining NWB)a Restrictions Weight Bearing Restrictions: Yes LLE Weight Bearing: Non weight bearing Other Position/Activity Restrictions: CAM boot for LLE, however if NWB, will need to find out if she needs to don this with gait, as it will just be extra weight.  RN aware and attempting to clarify.      Mobility  Bed Mobility Overal bed mobility: Modified Independent             General bed mobility comments: pt able to lift Lt LE off pillows and bring off EOB and back into bed  with incr time due to pain  Transfers Overall transfer level: Needs assistance Equipment used: Crutches;Rolling walker (2 wheeled) Transfers: Sit to/from [facility] Sit to Stand: Supervision Stand pivot transfers: Supervision       General transfer comment: Supervision with RW, CGA for trial of crutches, recommend walker  Ambulation/Gait Ambulation/Gait assistance: Min guard Ambulation Distance (Feet): 100 Feet Assistive device: Rolling walker (2 wheeled);Crutches (knee scooter) Gait Pattern/deviations:  (NWB Bentley LE, good compliance) Gait velocity: Pt continues to be somewhat impulsive with movement, however was able to manage well on scooter in straight path.  Needed HOH cues for steering scooter with short turns.  Gait velocity interpretation: Below normal speed for age/gender General Gait Details: Much better with walker vs crutches. LOB with crutches.   Stairs         General stair comments: Visual demonstration only today, patient not doing as well as yesterday.  Wheelchair Mobility    Modified Rankin (Stroke Patients Only)       Balance Overall balance assessment: Needs assistance   Sitting balance-Leahy Scale: Normal     Standing balance support: Single extremity supported Standing balance-Leahy Scale: Fair Standing balance comment: Supervison for safety                    Cognition Arousal/Alertness: Awake/alert Behavior During Therapy: WFL for tasks assessed/performed Overall Cognitive Status: Within Functional Limits for tasks assessed                      Exercises  General Comments        Pertinent Vitals/Pain Pain Assessment: 0-10 Pain Score: 4  Pain Location: Left ankle Pain Descriptors / Indicators: Aching Pain Intervention(s): Limited activity within patient's tolerance;Monitored during session;Repositioned    Home Living Family/patient expects to be discharged to:: Private residence Living  Arrangements: Children Available Help at Discharge: Family;Available PRN/intermittently Type of Home: House Home Access: Stairs to enter Entrance Stairs-Rails: Left Home Layout: 1/2 bath on main level Home Equipment: Cane - single point;Crutches      Prior Function Level of Independence: Independent          PT Goals (current goals can now be found in the care plan section) Acute Rehab PT Goals Patient Stated Goal: to go home sunday PT Goal Formulation: With patient Time For Goal Achievement: Nov 17, 2014 Potential to Achieve Goals: Good Progress towards PT goals: Progressing toward goals    Frequency  Min 5X/week    PT Plan Current plan remains appropriate    Co-evaluation             End of Session Equipment Utilized During Treatment: Gait belt (CAM boot on RLE) Activity Tolerance: Patient tolerated treatment well;Patient limited by fatigue Patient left: in bed;with call bell/phone within reach     Time: 1455-1530 PT Time Calculation (min) (ACUTE ONLY): 35 min  Charges:  $Gait Training: 8-22 mins $Therapeutic Activity: 8-22 mins                    G Codes:      [name] 17-Nov-2014, 4:13 PM

## 2014-10-23 MED ORDER — FLEET ENEMA 7-19 GM/118ML RE ENEM
1.0000 | ENEMA | Freq: Once | RECTAL | Status: AC
  Administered 2014-10-23: 1 via RECTAL
  Filled 2014-10-23: qty 1

## 2014-10-23 NOTE — Progress Notes (Signed)
Pt stable For wound vac change today Tried mag citrate last pm - may need enema Possible dc today +/- wound vac

## 2014-10-24 MED ORDER — METHOCARBAMOL 500 MG PO TABS: 500.0000 mg | ORAL_TABLET | Freq: Three times a day (TID) | ORAL | Status: DC

## 2014-10-24 MED ORDER — OXYCODONE-ACETAMINOPHEN 5-325 MG PO TABS: 1.0000 | ORAL_TABLET | ORAL | Status: DC | PRN

## 2014-10-24 NOTE — Discharge Summary (Signed)
Physician Discharge Summary  Patient ID: [name] MRN: [id number] DOB/AGE: November 21, 1975 38 y.o.  Admit date: 10/14/2014 Discharge date: 10/24/2014  Admission Diagnoses: Open talar fracture with dysvascular talar body  Discharge Diagnoses: Same Active Problems:   Talus fracture   Discharged Condition: stable  Hospital Course: Patient's hospital course was essentially unremarkable. She initially underwent irrigation and debridement and external fixation for dysvascular talar fracture. Patient then return to the operating room for tibial calcaneal fusion with removal of the dysvascular talar body. A wound VAC was applied. Postoperatively patient progressed well. The incision was well approximated with no ischemic changes no dehiscence no signs of infection. Patient was discharged to home in stable condition. Touchdown weightbearing on the left.  Consults: None  Significant Diagnostic Studies: labs: Routine labs  Treatments: surgery: See operative note  Discharge Exam: Blood pressure 103/49, pulse 107, temperature 98.1 F (36.7 C), temperature source Oral, resp. rate 16, height 5' 6.5" (1.689 m), weight 73.029 kg (161 lb), last menstrual period 01/07/2013, SpO2 100 %. Incision/Wound: incision clean and dry  Disposition: 01-Home or Self Care  Discharge Instructions    Call MD / Call 911    Complete by:  As directed   If you experience chest pain or shortness of breath, CALL 911 and be transported to the hospital emergency room.  If you develope a fever above 101 F, pus (white drainage) or increased drainage or redness at the wound, or calf pain, call your surgeon's office.     Change dressing    Complete by:  As directed   Change dressing as needed. Wash foot with soap and water.     Constipation Prevention    Complete by:  As directed   Drink plenty of fluids.  Prune juice may be helpful.  You may use a stool softener, such as Colace (over the counter) 100 mg twice a day.  Use  MiraLax (over the counter) for constipation as needed.     Diet - low sodium heart healthy    Complete by:  As directed      Elevate operative extremity    Complete by:  As directed      Increase activity slowly as tolerated    Complete by:  As directed      Touch down weight bearing    Complete by:  As directed   Laterality:  left  Extremity:  Lower  Use fracture boot for touchdown weightbearing on the left. If not ambulating do not need to where fracture boot.            Medication List    STOP taking these medications        doxycycline 50 MG capsule  Commonly known as:  VIBRAMYCIN      TAKE these medications        albuterol 108 (90 BASE) MCG/ACT inhaler  Commonly known as:  PROVENTIL HFA;VENTOLIN HFA  Inhale 2 puffs into the lungs every 6 (six) hours as needed for wheezing.     beclomethasone 40 MCG/ACT inhaler  Commonly known as:  QVAR  Inhale 2 puffs into the lungs 2 (two) times daily. For 2 months, then may discontinue if wheezing resolved     BIOTIN PO  Take 1 tablet by mouth daily.     DULoxetine 20 MG capsule  Commonly known as:  CYMBALTA  Take 20 mg by mouth 2 (two) times daily.     HYDROcodone-acetaminophen 7.5-325 mg/15 ml solution  Commonly known as:  HYCET  Take 10-15 mLs by mouth every 4 (four) hours as needed for moderate pain.     hydrOXYzine 50 MG tablet  Commonly known as:  ATARAX/VISTARIL  Take 50 mg by mouth 3 (three) times daily as needed for anxiety.     ibuprofen 800 MG tablet  Commonly known as:  ADVIL,MOTRIN  Take 800 mg by mouth every 8 (eight) hours as needed (mild pain).     methocarbamol 500 MG tablet  Commonly known as:  ROBAXIN  Take 1 tablet (500 mg total) by mouth 3 (three) times daily.     oxyCODONE-acetaminophen 5-325 MG per tablet  Commonly known as:  ROXICET  Take 1 tablet by mouth every 4 (four) hours as needed for severe pain.     traZODone 100 MG tablet  Commonly known as:  DESYREL  Take 100-200 mg by mouth at  bedtime.           Follow-up Information    Follow up with Naiah Donahoe V, MD In 2 weeks.   Specialty:  Orthopedic Surgery   Contact information:   [place] [address] [phone]       Signed: Newt Minion 10/24/2014, 6:51 AM

## 2014-10-24 NOTE — Progress Notes (Signed)
Physical Therapy Treatment Patient Details Name: [name] MRN: [id number] DOB: 1976/03/13 Today's Date: 10/24/2014    History of Present Illness pt is a 38 y.o. female adm after fall from [employer]nd store; s/p I&D and external fixation placement due to Lt Talus fx.     PT Comments    Patient able to negotiate stairs with rail and crutch.  States has crutches at home.  Encouraged to wait to go to second floor till after HHPT has instructed for safety.  She has half bath downstairs.  Will be safe for short distant ambulation unaided.  Fatigues on longer distances, but don't think needs a wheelchair.  Would benefit from hospital bed on main level.  Follow Up Recommendations  Home health PT;Supervision - Intermittent     Equipment Recommendations  Rolling walker with 5" wheels;Hospital bed    Recommendations for Other Services       Precautions / Restrictions Precautions Precautions: Fall Required Braces or Orthoses: Other Brace/Splint Other Brace/Splint: Has CAM boot for L LE  Restrictions LLE Weight Bearing: Non weight bearing    Mobility  Bed Mobility Overal bed mobility: Modified Independent                Transfers   Equipment used: Rolling walker (2 wheeled) Transfers: Sit to/from Stand Sit to Stand: Supervision         General transfer comment: cues for safety with hand placement sit<>stand  Ambulation/Gait Ambulation/Gait assistance: Min guard;Supervision Ambulation Distance (Feet): 100 Feet (x 2) Assistive device: Rolling walker (2 wheeled) Gait Pattern/deviations: Step-to pattern     General Gait Details: maintains NWB with camboot, needs assist for safety   Stairs Stairs: Yes Stairs assistance: Min assist Stair Management: Step to pattern;One rail Right;With crutches;Forwards Number of Stairs: 2 General stair comments: Visual demo and pt performed.  Educated to use shoe rather than boot on right when negotating stairs at home due to needing to  flex ankle to descend stairs safely; patient reports has fear of falling when looking down.  Encouraged not to negotiate stairs unassisted and to rely on HHPT to instruct on her stairs for safety  Wheelchair Mobility    Modified Rankin (Stroke Patients Only)       Balance Overall balance assessment: Needs assistance           Standing balance-Leahy Scale: Poor Standing balance comment: needs assist due to NWB                    Cognition Arousal/Alertness: Awake/alert Behavior During Therapy: WFL for tasks assessed/performed Overall Cognitive Status: Within Functional Limits for tasks assessed                      Exercises      General Comments        Pertinent Vitals/Pain Pain Assessment: Faces Faces Pain Scale: Hurts even more Pain Location: left ankle after VAC removed and dressing change Pain Intervention(s): Monitored during session    Home Living                      Prior Function            PT Goals (current goals can now be found in the care plan section) Progress towards PT goals: Progressing toward goals    Frequency  Min 5X/week    PT Plan Current plan remains appropriate    Co-evaluation  End of Session Equipment Utilized During Treatment: Gait belt (cam boot on both feet) Activity Tolerance: Patient tolerated treatment well Patient left: in bed;with call bell/phone within reach;with family/visitor present     Time: 4496-7591 PT Time Calculation (min) (ACUTE ONLY): 24 min  Charges:  $Gait Training: 23-37 mins                    G Codes:      [name],[name] 10, 2016, 12:42 PM [name], [name] 2014-11-20

## 2014-11-15 DIAGNOSIS — S91009A Unspecified open wound, unspecified ankle, initial encounter: Secondary | ICD-10-CM | POA: Diagnosis not present

## 2014-11-24 DIAGNOSIS — S92109A Unspecified fracture of unspecified talus, initial encounter for closed fracture: Secondary | ICD-10-CM | POA: Diagnosis not present

## 2014-11-25 DIAGNOSIS — S92102D Unspecified fracture of left talus, subsequent encounter for fracture with routine healing: Secondary | ICD-10-CM | POA: Diagnosis not present

## 2014-12-12 DIAGNOSIS — S92109A Unspecified fracture of unspecified talus, initial encounter for closed fracture: Secondary | ICD-10-CM | POA: Diagnosis not present

## 2014-12-21 DIAGNOSIS — Z202 Contact with and (suspected) exposure to infections with a predominantly sexual mode of transmission: Secondary | ICD-10-CM | POA: Diagnosis not present

## 2014-12-21 DIAGNOSIS — Z0000 Encounter for general adult medical examination without abnormal findings: Secondary | ICD-10-CM | POA: Diagnosis not present

## 2014-12-21 DIAGNOSIS — R319 Hematuria, unspecified: Secondary | ICD-10-CM | POA: Diagnosis not present

## 2014-12-21 DIAGNOSIS — Z01411 Encounter for gynecological examination (general) (routine) with abnormal findings: Secondary | ICD-10-CM | POA: Diagnosis not present

## 2014-12-21 DIAGNOSIS — Z113 Encounter for screening for infections with a predominantly sexual mode of transmission: Secondary | ICD-10-CM | POA: Diagnosis not present

## 2014-12-23 DIAGNOSIS — S92102D Unspecified fracture of left talus, subsequent encounter for fracture with routine healing: Secondary | ICD-10-CM | POA: Diagnosis not present

## 2014-12-25 DIAGNOSIS — S92109A Unspecified fracture of unspecified talus, initial encounter for closed fracture: Secondary | ICD-10-CM | POA: Diagnosis not present

## 2014-12-29 DIAGNOSIS — R75 Inconclusive laboratory evidence of human immunodeficiency virus [HIV]: Secondary | ICD-10-CM | POA: Diagnosis not present

## 2014-12-29 DIAGNOSIS — Z21 Asymptomatic human immunodeficiency virus [HIV] infection status: Secondary | ICD-10-CM | POA: Diagnosis not present

## 2015-01-04 ENCOUNTER — Ambulatory Visit: Payer: Medicare Other | Attending: Orthopedic Surgery | Admitting: Physical Therapy

## 2015-01-04 DIAGNOSIS — M25611 Stiffness of right shoulder, not elsewhere classified: Secondary | ICD-10-CM | POA: Insufficient documentation

## 2015-01-04 DIAGNOSIS — M25672 Stiffness of left ankle, not elsewhere classified: Secondary | ICD-10-CM | POA: Diagnosis not present

## 2015-01-04 DIAGNOSIS — M25511 Pain in right shoulder: Secondary | ICD-10-CM | POA: Diagnosis not present

## 2015-01-04 DIAGNOSIS — R262 Difficulty in walking, not elsewhere classified: Secondary | ICD-10-CM | POA: Insufficient documentation

## 2015-01-04 NOTE — Patient Instructions (Signed)
SHOULDER: External Rotation - Supine (Cane)   Hold cane with both hands. Rotate arm away from body. Keep elbow on floor and next to body. _10__ reps per set, __2_ sets per day, __5-7_ days per week Add towel to keep elbow at side.  Copyright  VHI. All rights reserved.  Cane Horizontal - Supine   With straight arms holding cane above shoulders, bring cane out to right, center, out to left, and back to above head. Repeat __10_ times. Do ___ times per day.  Copyright  VHI. All rights reserved.  Cane Exercise: Flexion   Lie on back, holding cane above chest. Keeping arms as straight as possible, lower cane toward floor beyond head. Hold _5-10___ seconds. Repeat ___10_ times. Do ___2_ sessions per day.  http://gt2.exer.us/91   Copyright  VHI. All rights reserved.  The patient is advised to apply ice or cold packs intermittently as needed to relieve pain. Apply a compressive ACE bandage. Rest and elevate the affected painful area.  Apply cold compresses intermittently as needed.  As pain recedes, begin normal activities slowly as tolerated.  Call if symptoms persist.

## 2015-01-04 NOTE — Therapy (Signed)
[place], [place], [address] Phone: [phone]   Fax:  [phone]  Physical Therapy Evaluation  Patient Details  Name: [name] MRN: [id number] Date of Birth: 1976/09/09 Referring Provider:  Newt Minion, MD  Encounter Date: 01/04/2015      PT End of Session - 01/04/15 1525    Visit Number 1   Number of Visits 16   Date for PT Re-Evaluation 03/01/15   PT Start Time 1413   PT Stop Time 1515   PT Time Calculation (min) 62 min   Activity Tolerance Patient limited by pain      Past Medical History  Diagnosis Date  . Depression   . Shortness of breath     on exertion  . Sleep apnea     mild per pt- no CPAP  . Headache(784.0)     migraines  . Anemia   . Asthma     no inhaler  . Menorrhagia with regular cycle 01/20/2013  . S/P endometrial ablation 01/20/2013  . S/P laparoscopic assisted vaginal hysterectomy (LAVH) 01/21/2013  . Preoperative clearance   . Hyperlipidemia     Past Surgical History  Procedure Laterality Date  . Tubal ligation    . Breast lumpectomy      RT side  . Laparoscopic assisted vaginal hysterectomy N/A 01/21/2013    Procedure: LAPAROSCOPIC ASSISTED VAGINAL HYSTERECTOMY;  Surgeon: Thornell Sartorius, MD;  Location: [facility];  Service: Gynecology;  Laterality: N/A;  . Bilateral salpingectomy N/A 01/21/2013    Procedure: BILATERAL SALPINGECTOMY;  Surgeon: Thornell Sartorius, MD;  Location: [facility];  Service: Gynecology;  Laterality: N/A;  . Abdominal hysterectomy    . Thyroid lobectomy Left 10/01/2013    Procedure: THYROID LOBECTOMY;  Surgeon: Madilyn Hook, DO;  Location: [facility];  Service: General;  Laterality: Left;  . I&d extremity Left 10/14/2014    Procedure: IRRIGATION AND DEBRIDEMENT OPEN TIBIA FRACTURE LEFT LEG;  Surgeon: Marianna Payment, MD;  Location: [facility];  Service: Orthopedics;  Laterality: Left;  . External fixation leg Left 10/14/2014    Procedure: EXTERNAL FIXATION  LEFT ANKLE;  Surgeon: Marianna Payment, MD;  Location: [facility];  Service: Orthopedics;  Laterality: Left;  . External fixation removal Left 10/19/2014    Procedure: REMOVAL EXTERNAL FIXATION LEG;  Surgeon: Newt Minion, MD;  Location: [facility];  Service: Orthopedics;  Laterality: Left;  . Ankle fusion Left 10/19/2014    Procedure: Tibiocalcaneal Fusion;  Surgeon: Newt Minion, MD;  Location: [facility];  Service: Orthopedics;  Laterality: Left;    LMP 01/07/2013  Visit Diagnosis:  Shoulder joint pain, right - Plan: PT plan of care cert/re-cert  Difficulty in walking involving ankle and foot joint - Plan: PT plan of care cert/re-cert  Stiffness of joint, ankle and foot, left - Plan: PT plan of care cert/re-cert  Stiffness of joint, shoulder region, right - Plan: PT plan of care cert/re-cert      Subjective Assessment - 01/04/15 1418    Symptoms Pt fell from 2 story window and landed with her L foot on a chair and Rt. UE.  She had surgery x2 in Dec.  She now presents for progressive WB and strengthening. Rt. UE with decr AROM, pain, weakness in hand and shoulder.  Lt LE pain decr ROM, edema, WBAT. Has crutch, RW but has not really done.  knee pain, occ numbness in sites of pains, lateral ankle.     Pertinent History fell 12/4  with irrigation and debridement for open tibia fx with ORIF by Dr. Erlinda Hong on 12/4/5 and then on 12/9/1 Dr. Sharol Given removed external fixator and fused ankle.  [facility].  Rt. foot surgery.  "years ago"   Limitations Lifting;Standing;Walking;House hold activities  has CNA for 2-3 hours   How long can you stand comfortably? Not at all with bearing weight.    How long can you walk comfortably? Does not walk or bear weight.    Patient Stated Goals Pt would like to be able to walk again, have less pain    Currently in Pain? Yes   Pain Score 8    Pain Location Shoulder   Pain Orientation Right   Pain Descriptors / Indicators Burning   Pain Type Acute pain   Pain Onset More  than a month ago   Pain Frequency Constant   Aggravating Factors  using/lifting arm   Pain Relieving Factors no relief with meds   Multiple Pain Sites Yes   Pain Score 0  no pain at rest but pain severe with WB   Pain Type Surgical pain   Pain Location Leg   Pain Orientation Left;Lateral;Proximal;Distal   Pain Descriptors / Indicators --  when she walks, "walking on bone"   Pain Frequency Intermittent   Pain Onset On-going          [facility] PT Assessment - 01/04/15 1429    Assessment   Medical Diagnosis Rt. UE impingement, L. ankle fusion   Onset Date 10/14/14   Next MD Visit 01/16/15   Prior Therapy No   Precautions   Precautions None   Restrictions   Weight Bearing Restrictions Yes   LLE Weight Bearing Weight bearing as tolerated   Balance Screen   Has the patient fallen in the past 6 months Yes   How many times? 3   Has the patient had a decrease in activity level because of a fear of falling?  Yes   Is the patient reluctant to leave their home because of a fear of falling?  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   Leisure dancing, Teacher, music   Overall Cognitive Status Within Functional Limits for tasks assessed   Behaviors Other (comment)  anxious   Sensation   Light Touch Impaired by gross assessment   Additional Comments lateral ankle and plantar foot L   Posture/Postural Control   Posture/Postural Control Postural limitations   Postural  Limitations Rounded Shoulders;Forward head;Flexed trunk   Posture Comments declined standing today   AROM   Overall AROM Comments poor effort in Rt UE   Right/Left Shoulder --  L WNL   Right Shoulder Flexion 110 Degrees  with AAROM   Right Shoulder ABduction 110 Degrees   Right Shoulder Internal Rotation --  reach to upper lumbar   Right Shoulder External Rotation --  70 deg, reach to back of neck with pain    Right Knee Extension  0   Right Knee Flexion 135   Left Knee Extension 0   Left Knee Flexion 132   Right Ankle Dorsiflexion -5   Right Ankle Plantar Flexion 40   Right Ankle Inversion 40   Right Ankle Eversion -10   Left Ankle Dorsiflexion 0   Left Ankle Plantar Flexion 0   Left Ankle Inversion --  TRACE   Left Ankle Eversion --  trace   Strength   Right Shoulder Flexion 4/5   Right Shoulder ABduction 4/5   Right Shoulder Internal Rotation 4+/5   Right Shoulder External Rotation 4/5   Left Shoulder Flexion 4+/5   Left Shoulder ABduction 4+/5   Right Hip Flexion 4/5   Left Hip Flexion 3/5   Right Knee Flexion 4+/5   Right Knee Extension 4+/5   Left Knee Flexion 3+/5   Left Knee Extension 3/5   Right/Left Ankle Right   Right Ankle Dorsiflexion 4+/5   Right Ankle Plantar Flexion --  NT   Right Ankle Inversion --  NT   Right Ankle Eversion --  NT   Left Ankle Dorsiflexion 1/5   Left Ankle Plantar Flexion 0/5   Left Ankle Inversion 0/5   Left Ankle Eversion 0/5   Palpation   Palpation sore lateral malleolus and just prox to scar.  Numbness present plantar aspect of foot and lateral ankle. No soreness in L shin. very sore in Rt. upper trap and ant shoulder   Transfers   Transfers Stand Pivot Transfers;Squat Pivot Transfers   Stand Pivot Transfers 4: Min guard  decr WB LLE with RW and boot   Squat Pivot Transfers 6: Modified independent (Device/Increase time)  safety issues, no brakes locked   Ambulation/Gait   Ambulation/Gait --  declined                   [facility] Adult PT Treatment/Exercise - 01/04/15 1458    Shoulder Exercises: ROM/Strengthening   Other ROM/Strengthening Exercises supine wand x 5 reps each                 PT Education - 01/04/15 1525    Education provided Yes   Education Details PT/POC, impingement, WBAT and use of crutches, UE ROM HEP, ice   Person(s) Educated Patient   Methods Explanation;Demonstration;Handout   Comprehension Verbalized understanding;Returned demonstration;Need further instruction          PT Short Term Goals - 01/04/15 2108    PT SHORT TERM GOAL #1   Title Pt will be I with initial HEP for Rt. UE and LE weightbearing program, ROM and use of RICE   Time 4   Period Weeks   Status New   PT SHORT TERM GOAL #2   Title Pt will be able to walk with WBAT and boot as needed in and out of the home with mod pain overall.    Time 4   Period Weeks   Status New   PT SHORT TERM GOAL #3   Title Pt. will go up and down steps to access home with 1 rail, mod I and WB on LLE   Time 4   Period Weeks   Status New   PT SHORT TERM GOAL #4   Title Pt. will be able to use Rt. UE to groom, ADLs and report only min pain.    Time 4   Period Weeks   Status New           PT Long Term Goals -  01/04/15 2111    PT LONG TERM GOAL #1   Title Pt. will be I with advanced HEP for Rt. UE and Lt LE   Time 8   Period Weeks   Status New   PT LONG TERM GOAL #2   Title Pt. will be able to walk without boot and LRAD with min pain overall for 300 feet   Time 8   Period Weeks   Status New   PT LONG TERM GOAL #3   Title Pt. will DC use of wheelchair for mobility.    Time 8   Period Weeks   Status New   PT LONG TERM GOAL #4   Title Pt. will be able to reach overhead with min occ pain in Rt. UE   Time 8   Period Weeks   Status New   PT LONG TERM GOAL #5   Title Pt will demo 4+/5 strength in Rt. UE to improve lifting, carrying objects.    Time 8   Period Weeks   Status New                Plan - 01/04/15 1526    Clinical Impression Statement This patient presents with significant limitation in functional mobility due to ankle fusion/ROM/strength and WB ability.  Pain is Rt. UE limits as well.  She is very reluctant to bear weight even with boot, reports scooting on her bottom around the house due to pain and abnormal sensation in foot. She will benefit from skilled PT to improve mobility and restore normal giat pattern.     Pt will benefit from skilled therapeutic intervention in order to improve on the following deficits Abnormal gait;Decreased coordination;Decreased range of motion;Difficulty walking;Impaired flexibility;Impaired sensation;Increased edema;Postural dysfunction;Decreased activity tolerance;Decreased balance;Decreased scar mobility;Increased muscle spasms;Impaired UE functional use;Pain;Decreased mobility;Decreased strength   Rehab Potential Good   PT Frequency 2x / week   PT Duration 8 weeks   PT Treatment/Interventions ADLs/Self Care Home Management;Moist Heat;Therapeutic activities;Patient/family education;Passive range of motion;Therapeutic exercise;DME Instruction;Ultrasound;Gait training;Balance training;Manual techniques;Cryotherapy;Stair training;Neuromuscular re-education;Electrical Stimulation;Functional mobility training;Other (comment)  ionto patch   PT Next Visit Plan check HEP for Rt. UE and work on LE swelling, PROM, tape? GAIT   PT Home Exercise Plan Rt. UE AAROM with cane   Recommended Other Services needs someone to fix leg rests on her WC. pt to F/U with [facility]   Consulted and Agree with Plan of Care Patient         Problem List Patient Active Problem List   Diagnosis Date Noted  . Talus fracture 10/14/2014  . Preoperative clearance 04/12/2014  . Wound, surgical, infected 10/18/2013  . S/P laparoscopic assisted vaginal hysterectomy (LAVH) 01/21/2013  . Menorrhagia with regular cycle 01/20/2013  . S/P endometrial  ablation 01/20/2013  . OBSTRUCTIVE SLEEP APNEA 07/20/2009  . ALLERGIC RHINITIS 07/20/2009  . ASTHMA 07/20/2009  . DYSPNEA 07/20/2009    Deloros Beretta 01/04/2015, 9:19 PM  [facility] [address] [place], [place], [address] Phone: [phone]   Fax:  [phone]

## 2015-01-05 DIAGNOSIS — E782 Mixed hyperlipidemia: Secondary | ICD-10-CM | POA: Diagnosis not present

## 2015-01-05 DIAGNOSIS — Z0000 Encounter for general adult medical examination without abnormal findings: Secondary | ICD-10-CM | POA: Diagnosis not present

## 2015-01-09 DIAGNOSIS — M25511 Pain in right shoulder: Secondary | ICD-10-CM | POA: Diagnosis not present

## 2015-01-16 DIAGNOSIS — S92102D Unspecified fracture of left talus, subsequent encounter for fracture with routine healing: Secondary | ICD-10-CM | POA: Diagnosis not present

## 2015-01-23 ENCOUNTER — Ambulatory Visit: Payer: Medicare Other | Attending: Orthopedic Surgery | Admitting: Physical Therapy

## 2015-01-23 DIAGNOSIS — M25511 Pain in right shoulder: Secondary | ICD-10-CM | POA: Diagnosis not present

## 2015-01-23 DIAGNOSIS — M25672 Stiffness of left ankle, not elsewhere classified: Secondary | ICD-10-CM | POA: Diagnosis not present

## 2015-01-23 DIAGNOSIS — M25611 Stiffness of right shoulder, not elsewhere classified: Secondary | ICD-10-CM | POA: Insufficient documentation

## 2015-01-23 DIAGNOSIS — R262 Difficulty in walking, not elsewhere classified: Secondary | ICD-10-CM | POA: Insufficient documentation

## 2015-01-23 DIAGNOSIS — S92109A Unspecified fracture of unspecified talus, initial encounter for closed fracture: Secondary | ICD-10-CM | POA: Diagnosis not present

## 2015-01-23 NOTE — Therapy (Signed)
[facility] [place], [place], [address] Phone: [phone]   Fax:  [phone]  Physical Therapy Treatment  Patient Details  Name: [name] MRN: [id number] Date of Birth: [birth year] Referring Provider:  Newt Minion, MD  Encounter Date: 01/23/2015      PT End of Session - 01/23/15 0904    Visit Number 2   Number of Visits 16   Date for PT Re-Evaluation 03/01/15   PT Start Time 0805   PT Stop Time 7517   PT Time Calculation (min) 50 min   Activity Tolerance Patient limited by pain      Past Medical History  Diagnosis Date  . Depression   . Shortness of breath     on exertion  . Sleep apnea     mild per pt- no CPAP  . Headache(784.0)     migraines  . Anemia   . Asthma     no inhaler  . Menorrhagia with regular cycle 01/20/2013  . S/P endometrial ablation 01/20/2013  . S/P laparoscopic assisted vaginal hysterectomy (LAVH) 01/21/2013  . Preoperative clearance   . Hyperlipidemia     Past Surgical History  Procedure Laterality Date  . Tubal ligation    . Breast lumpectomy      RT side  . Laparoscopic assisted vaginal hysterectomy N/A 01/21/2013    Procedure: LAPAROSCOPIC ASSISTED VAGINAL HYSTERECTOMY;  Surgeon: Thornell Sartorius, MD;  Location: [facility];  Service: Gynecology;  Laterality: N/A;  . Bilateral salpingectomy N/A 01/21/2013    Procedure: BILATERAL SALPINGECTOMY;  Surgeon: Thornell Sartorius, MD;  Location: [facility];  Service: Gynecology;  Laterality: N/A;  . Abdominal hysterectomy    . Thyroid lobectomy Left 10/01/2013    Procedure: THYROID LOBECTOMY;  Surgeon: Madilyn Hook, DO;  Location: [facility];  Service: General;  Laterality: Left;  . I&d extremity Left 10/14/2014    Procedure: IRRIGATION AND DEBRIDEMENT OPEN TIBIA FRACTURE LEFT LEG;  Surgeon: Marianna Payment, MD;  Location: [facility];  Service: Orthopedics;  Laterality: Left;  . External fixation leg Left 10/14/2014    Procedure: EXTERNAL FIXATION LEFT  ANKLE;  Surgeon: Marianna Payment, MD;  Location: [facility];  Service: Orthopedics;  Laterality: Left;  . External fixation removal Left 10/19/2014    Procedure: REMOVAL EXTERNAL FIXATION LEG;  Surgeon: Newt Minion, MD;  Location: [facility];  Service: Orthopedics;  Laterality: Left;  . Ankle fusion Left 10/19/2014    Procedure: Tibiocalcaneal Fusion;  Surgeon: Newt Minion, MD;  Location: [facility];  Service: Orthopedics;  Laterality: Left;    There were no vitals filed for this visit.  Visit Diagnosis:  No diagnosis found.      Subjective Assessment - 01/23/15 0807    Symptoms C/o tingling sensation in ankle.  No pain at rst.  She has crutches, needs new wheelchair due to leg rest.  Admits to not really walking and standing on LE very much.     Currently in Pain? No/denies   Pain Location Shoulder   Pain Descriptors / Indicators --  sometimes electric shock with WB   Multiple Pain Sites Yes   Pain Score 0  can be an 8/10 at times   Pain Location Leg   Pain Orientation Left                       [facility] Adult PT Treatment/Exercise - 01/23/15 0833    Shoulder Exercises: ROM/Strengthening   Other ROM/Strengthening Exercises  supine wand x 10 reps each    Manual Therapy   Manual Therapy Passive ROM;Other (comment)   Ankle Exercises: Stretches   Other Stretch seated towel stretch 30 sec x 5   Ankle Exercises: Standing   Other Standing Ankle Exercises toe scrunches with WB   Other Standing Ankle Exercises weightshifting with crutches     Did not tolerate PROM well done by PT. Explained importance of self massage and desensitizing foot, ankle.  Also discussed AFO, possible custom orthotic in the future for improved weightbearing.  Called and made appt for Chris (Adv P/O) next week Wed. At 915.  NuStep level 5, UE and LE for 6 min .    Kinesiotape for edema 2 fans post medial calf and ankle.          PT Education - 01/23/15 0903    Education provided Yes   Education  Details gait, UE HEP check, ankle PROM   Person(s) Educated Patient   Methods Explanation;Demonstration;Handout   Comprehension Verbalized understanding;Need further instruction          PT Short Term Goals - 01/04/15 2108    PT SHORT TERM GOAL #1   Title Pt will be I with initial HEP for Rt. UE and LE weightbearing program, ROM and use of RICE   Time 4   Period Weeks   Status New   PT SHORT TERM GOAL #2   Title Pt will be able to walk with WBAT and boot as needed in and out of the home with mod pain overall.    Time 4   Period Weeks   Status New   PT SHORT TERM GOAL #3   Title Pt. will go up and down steps to access home with 1 rail, mod I and WB on LLE   Time 4   Period Weeks   Status New   PT SHORT TERM GOAL #4   Title Pt. will be able to use Rt. UE to groom, ADLs and report only min pain.    Time 4   Period Weeks   Status New           PT Long Term Goals - 01/04/15 2111    PT LONG TERM GOAL #1   Title Pt. will be I with advanced HEP for Rt. UE and Lt LE   Time 8   Period Weeks   Status New   PT LONG TERM GOAL #2   Title Pt. will be able to walk without boot and LRAD with min pain overall for 300 feet   Time 8   Period Weeks   Status New   PT LONG TERM GOAL #3   Title Pt. will DC use of wheelchair for mobility.    Time 8   Period Weeks   Status New   PT LONG TERM GOAL #4   Title Pt. will be able to reach overhead with min occ pain in Rt. UE   Time 8   Period Weeks   Status New   PT LONG TERM GOAL #5   Title Pt will demo 4+/5 strength in Rt. UE to improve lifting, carrying objects.    Time 8   Period Weeks   Status New               Plan - 01/23/15 1039    Clinical Impression Statement [name] has very limited mobility in LLE. She will require a custom AFO.shoe to regain full ability walk.  She  resists weightbearing due to pain.  She was given passive stretch for ankle DF and encouraged to self range ankle in sitting, mobilize toes.   Shoulder is improved, would like to strengthen it.     PT Next Visit Plan add UE strength, cont pre-gait and manual L ankle   Consulted and Agree with Plan of Care Patient  needs to bring in to store, called Adv. today to confirm        Problem List Patient Active Problem List   Diagnosis Date Noted  . Talus fracture 10/14/2014  . Preoperative clearance 04/12/2014  . Wound, surgical, infected 10/18/2013  . S/P laparoscopic assisted vaginal hysterectomy (LAVH) 01/21/2013  . Menorrhagia with regular cycle 01/20/2013  . S/P endometrial ablation 01/20/2013  . OBSTRUCTIVE SLEEP APNEA 07/20/2009  . ALLERGIC RHINITIS 07/20/2009  . ASTHMA 07/20/2009  . DYSPNEA 07/20/2009    PAA,JENNIFER 01/23/2015, 1:21 PM  [facility] [address] [place], [place], [address] Phone: [phone]   Fax:  [phone]

## 2015-01-23 NOTE — Patient Instructions (Signed)
Toe Extension: Stretch - Flexors   Position Helper: Hold left foot securely under heel. Motion - Helper presses toes gently toward shin. CAUTION: Do not allow foot to twist. Stop at point of tension in muscle or joint. Hold __30_ seconds. Repeat __3-5_ times. Repeat with other leg. Do _2__ sessions per day. Variation: Perform with ankle flexed. Gently press toes toward shin. Perform only on these toes: ____L __.  Copyright  VHI. All rights reserved.  Gastroc / Heel Cord Stretch - Seated With Towel   Sit on floor, towel around ball of foot. Gently pull foot in toward body, stretching heel cord and calf. Hold for __30_ seconds. Repeat on involved leg. Repeat __2-3_ times. Do _2__ times per day.  Copyright  VHI. All rights reserved.

## 2015-01-25 ENCOUNTER — Encounter: Payer: 59 | Admitting: Physical Therapy

## 2015-01-30 ENCOUNTER — Ambulatory Visit: Payer: Medicare Other | Admitting: Physical Therapy

## 2015-01-30 DIAGNOSIS — M25611 Stiffness of right shoulder, not elsewhere classified: Secondary | ICD-10-CM | POA: Diagnosis not present

## 2015-01-30 DIAGNOSIS — R262 Difficulty in walking, not elsewhere classified: Secondary | ICD-10-CM

## 2015-01-30 DIAGNOSIS — M25672 Stiffness of left ankle, not elsewhere classified: Secondary | ICD-10-CM

## 2015-01-30 DIAGNOSIS — M25511 Pain in right shoulder: Secondary | ICD-10-CM | POA: Diagnosis not present

## 2015-01-30 NOTE — Therapy (Signed)
[place], [place], [address] Phone: [phone]   Fax:  [phone]  Physical Therapy Treatment  Patient Details  Name: [name] MRN: [id number] Date of Birth: 1976-11-05 Referring Provider:  Newt Minion, MD  Encounter Date: 01/30/2015      PT End of Session - 01/30/15 1047    Visit Number 3   Number of Visits 16   Date for PT Re-Evaluation 03/01/15   PT Start Time 0354   PT Stop Time 1115   PT Time Calculation (min) 57 min      Past Medical History  Diagnosis Date  . Depression   . Shortness of breath     on exertion  . Sleep apnea     mild per pt- no CPAP  . Headache(784.0)     migraines  . Anemia   . Asthma     no inhaler  . Menorrhagia with regular cycle 01/20/2013  . S/P endometrial ablation 01/20/2013  . S/P laparoscopic assisted vaginal hysterectomy (LAVH) 01/21/2013  . Preoperative clearance   . Hyperlipidemia     Past Surgical History  Procedure Laterality Date  . Tubal ligation    . Breast lumpectomy      RT side  . Laparoscopic assisted vaginal hysterectomy N/A 01/21/2013    Procedure: LAPAROSCOPIC ASSISTED VAGINAL HYSTERECTOMY;  Surgeon: Thornell Sartorius, MD;  Location: [facility];  Service: Gynecology;  Laterality: N/A;  . Bilateral salpingectomy N/A 01/21/2013    Procedure: BILATERAL SALPINGECTOMY;  Surgeon: Thornell Sartorius, MD;  Location: [facility];  Service: Gynecology;  Laterality: N/A;  . Abdominal hysterectomy    . Thyroid lobectomy Left 10/01/2013    Procedure: THYROID LOBECTOMY;  Surgeon: Madilyn Hook, DO;  Location: [facility];  Service: General;  Laterality: Left;  . I&d extremity Left 10/14/2014    Procedure: IRRIGATION AND DEBRIDEMENT OPEN TIBIA FRACTURE LEFT LEG;  Surgeon: Marianna Payment, MD;  Location: [facility];  Service: Orthopedics;  Laterality: Left;  . External fixation leg Left 10/14/2014    Procedure: EXTERNAL FIXATION LEFT ANKLE;  Surgeon: Marianna Payment, MD;   Location: [facility];  Service: Orthopedics;  Laterality: Left;  . External fixation removal Left 10/19/2014    Procedure: REMOVAL EXTERNAL FIXATION LEG;  Surgeon: Newt Minion, MD;  Location: [facility];  Service: Orthopedics;  Laterality: Left;  . Ankle fusion Left 10/19/2014    Procedure: Tibiocalcaneal Fusion;  Surgeon: Newt Minion, MD;  Location: [facility];  Service: Orthopedics;  Laterality: Left;    There were no vitals filed for this visit.  Visit Diagnosis:  Shoulder joint pain, right  Difficulty in walking involving ankle and foot joint  Stiffness of joint, ankle and foot, left  Stiffness of joint, shoulder region, right      Subjective Assessment - 01/30/15 1437    Symptoms Shoulder still hurts some. Side of ankle and foot are 8.5/10 pain constantly but I want to try to walk. I brought a shoe. My foot just feels heavy and drops down. The doctor said I would never get any more motion out of amy ankle.   Currently in Pain? Yes   Aggravating Factors  weight bearing on LLE   Pain Relieving Factors cold/ice            [facility] PT Assessment - 01/30/15 0001    Strength   Right/Left Hip Right   Right Hip Extension 3/5   Right Hip ABduction 4-/5  [facility] Adult PT Treatment/Exercise - 01/30/15 0001    Ambulation/Gait   Ambulation/Gait Yes   Ambulation/Gait Assistance 5: Supervision;4: Min guard   Ambulation/Gait Assistance Details 3 LOB with Min assist to correct   Ambulation Distance (Feet) 140 Feet   Assistive device Crutches   Gait Pattern Step-to pattern   Ambulation Surface Level;Indoor   Gait Comments Pre gait activities including weight shifting right to left and staggered standing. Forward walking x 3 passes in paralel bars with pt limited by lack of ankle ROM.    Knee/Hip Exercises: Aerobic   Stationary Bike Nustep Level 1 x 3 minutes, dc due to toe cramp    Knee/Hip Exercises: Seated   Long Arc Quad AROM;2 sets;10 reps   Knee/Hip Exercises:  Supine   Other Supine Knee Exercises 4 way hip x 10 reps left, encouraged pt to perform on right due to NWB    Knee/Hip Exercises: Prone   Hamstring Curl 2 sets;10 reps   Modalities   Modalities Cryotherapy   Cryotherapy   Number Minutes Cryotherapy 15 Minutes   Cryotherapy Location Ankle   Type of Cryotherapy Other (comment)  min pressure 36 degrees                 PT Education - 01/30/15 1054    Education provided Yes   Education Details 4 way hip, prone h/s curl, seated LAQ   Person(s) Educated Patient   Methods Explanation;Handout   Comprehension Verbalized understanding          PT Short Term Goals - 01/04/15 2108    PT SHORT TERM GOAL #1   Title Pt will be I with initial HEP for Rt. UE and LE weightbearing program, ROM and use of RICE   Time 4   Period Weeks   Status New   PT SHORT TERM GOAL #2   Title Pt will be able to walk with WBAT and boot as needed in and out of the home with mod pain overall.    Time 4   Period Weeks   Status New   PT SHORT TERM GOAL #3   Title Pt. will go up and down steps to access home with 1 rail, mod I and WB on LLE   Time 4   Period Weeks   Status New   PT SHORT TERM GOAL #4   Title Pt. will be able to use Rt. UE to groom, ADLs and report only min pain.    Time 4   Period Weeks   Status New           PT Long Term Goals - 01/04/15 2111    PT LONG TERM GOAL #1   Title Pt. will be I with advanced HEP for Rt. UE and Lt LE   Time 8   Period Weeks   Status New   PT LONG TERM GOAL #2   Title Pt. will be able to walk without boot and LRAD with min pain overall for 300 feet   Time 8   Period Weeks   Status New   PT LONG TERM GOAL #3   Title Pt. will DC use of wheelchair for mobility.    Time 8   Period Weeks   Status New   PT LONG TERM GOAL #4   Title Pt. will be able to reach overhead with min occ pain in Rt. UE   Time 8   Period Weeks   Status New   PT LONG TERM  GOAL #5   Title Pt will demo 4+/5 strength in  Rt. UE to improve lifting, carrying objects.    Time 8   Period Weeks   Status New               Plan - 01/30/15 1443    Clinical Impression Statement Pt willing to try weight bearing activities today. She required max cues for correct use of crutches with WBAT gait. She lost her balance 3 times and required min assist to recover. She exhibits hip weakness and was issued 4 way hip strengthening and left knee strengthening  to assist with return  to ambulation.    PT Next Visit Plan add UE strength, cont pre-gait and manual L ankle        Problem List Patient Active Problem List   Diagnosis Date Noted  . Talus fracture 10/14/2014  . Preoperative clearance 04/12/2014  . Wound, surgical, infected 10/18/2013  . S/P laparoscopic assisted vaginal hysterectomy (LAVH) 01/21/2013  . Menorrhagia with regular cycle 01/20/2013  . S/P endometrial ablation 01/20/2013  . OBSTRUCTIVE SLEEP APNEA 07/20/2009  . ALLERGIC RHINITIS 07/20/2009  . ASTHMA 07/20/2009  . DYSPNEA 07/20/2009    Dorene Ar, PTA 01/30/2015, 2:47 PM  [facility] [address] [place], [place], [address] Phone: [phone]   Fax:  [phone]

## 2015-01-30 NOTE — Patient Instructions (Signed)
Hip Extension (Prone)   Lift left leg ___6_ inches from floor, keeping knee locked. Repeat ___10-20_ times per set. Do _2___ sets per session. Do _2___ sessions per day.  http://orth.exer.us/98   Copyright  VHI. All rights reserved.  Hip Adduction: Leg Lift (Eccentric) - Side-Lying   Lie on side with top leg bent, foot flat behind lower leg. Quickly lift lower leg. Slowly lower for 3-5 seconds. _10-20__ reps per set, ___2 sets per day, _7__ days per week. Add ___ lbs when you achieve ___ repetitions.  Copyright  VHI. All rights reserved.  Abduction: Side Leg Lift (Eccentric) - Side-Lying   Lie on side. Lift top leg slightly higher than shoulder level. Keep top leg straight with body, toes pointing forward. Slowly lower for 3-5 seconds. _10-20__ reps per set,2 ___ sets per day, _7__ days per week. Add ___ lbs when you achieve ___ repetitions.  Copyright  VHI. All rights reserved.  Strengthening: Straight Leg Raise (Phase 1)   Tighten muscles on front of right thigh, then lift leg __12__ inches from surface, keeping knee locked.  Repeat ___10-20_ times per set. Do _2___ sets per session. Do __2__ sessions per day.  http://orth.exer.us/614   Copyright  VHI. All rights reserved.  Hamstrings   Lie on stomach with __0__ pound weight around left ankle. Bend same knee __90__ degrees, pointing toes toward knee. Do not bend hips. Hold __5__ seconds. Repeat _10-20___ times. Do __2__ sessions per day. CAUTION: Move slowly.  Copyright  VHI. All rights reserved.  5Long Arc Quad   Straighten operated leg and try to hold it ___5_ seconds. Use __0__ lbs on ankle. Repeat __10-20__ times. Do __2__ sessions a day.  http://gt2.exer.us/311   Copyright  VHI. All rights reserved.

## 2015-02-01 ENCOUNTER — Ambulatory Visit: Payer: Medicare Other | Admitting: Physical Therapy

## 2015-02-01 DIAGNOSIS — R262 Difficulty in walking, not elsewhere classified: Secondary | ICD-10-CM | POA: Diagnosis not present

## 2015-02-01 DIAGNOSIS — M25611 Stiffness of right shoulder, not elsewhere classified: Secondary | ICD-10-CM

## 2015-02-01 DIAGNOSIS — M25511 Pain in right shoulder: Secondary | ICD-10-CM

## 2015-02-01 DIAGNOSIS — M25672 Stiffness of left ankle, not elsewhere classified: Secondary | ICD-10-CM | POA: Diagnosis not present

## 2015-02-01 NOTE — Therapy (Signed)
[place], [place], [address] Phone: [phone]   Fax:  [phone]  Physical Therapy Treatment  Patient Details  Name: [name] MRN: [id number] Date of Birth: 1976-04-17 Referring Provider:  Newt Minion, MD  Encounter Date: 02/01/2015    Past Medical History  Diagnosis Date  . Depression   . Shortness of breath     on exertion  . Sleep apnea     mild per pt- no CPAP  . Headache(784.0)     migraines  . Anemia   . Asthma     no inhaler  . Menorrhagia with regular cycle 01/20/2013  . S/P endometrial ablation 01/20/2013  . S/P laparoscopic assisted vaginal hysterectomy (LAVH) 01/21/2013  . Preoperative clearance   . Hyperlipidemia     Past Surgical History  Procedure Laterality Date  . Tubal ligation    . Breast lumpectomy      RT side  . Laparoscopic assisted vaginal hysterectomy N/A 01/21/2013    Procedure: LAPAROSCOPIC ASSISTED VAGINAL HYSTERECTOMY;  Surgeon: Thornell Sartorius, MD;  Location: [facility];  Service: Gynecology;  Laterality: N/A;  . Bilateral salpingectomy N/A 01/21/2013    Procedure: BILATERAL SALPINGECTOMY;  Surgeon: Thornell Sartorius, MD;  Location: [facility];  Service: Gynecology;  Laterality: N/A;  . Abdominal hysterectomy    . Thyroid lobectomy Left 10/01/2013    Procedure: THYROID LOBECTOMY;  Surgeon: Madilyn Hook, DO;  Location: [facility];  Service: General;  Laterality: Left;  . I&d extremity Left 10/14/2014    Procedure: IRRIGATION AND DEBRIDEMENT OPEN TIBIA FRACTURE LEFT LEG;  Surgeon: Marianna Payment, MD;  Location: [facility];  Service: Orthopedics;  Laterality: Left;  . External fixation leg Left 10/14/2014    Procedure: EXTERNAL FIXATION LEFT ANKLE;  Surgeon: Marianna Payment, MD;  Location: [facility];  Service: Orthopedics;  Laterality: Left;  . External fixation removal Left 10/19/2014    Procedure: REMOVAL EXTERNAL FIXATION LEG;  Surgeon: Newt Minion, MD;  Location: [facility];  Service:  Orthopedics;  Laterality: Left;  . Ankle fusion Left 10/19/2014    Procedure: Tibiocalcaneal Fusion;  Surgeon: Newt Minion, MD;  Location: [place] [facility];  Service: Orthopedics;  Laterality: Left;    There were no vitals filed for this visit.  Visit Diagnosis:  Difficulty in walking involving ankle and foot joint  Shoulder joint pain, right  Stiffness of joint, ankle and foot, left  Stiffness of joint, shoulder region, right      Subjective Assessment - 02/01/15 1026    Symptoms I have been doing 3 sets of 20 reps on the hip exercises. I have not been trying to put any weight through my foot. I have been doing shoulder exercises with 5 lb weights.    Pain Score 8    Pain Location Ankle   Pain Orientation Left                       [facility] Adult PT Treatment/Exercise - 02/01/15 0001    Ambulation/Gait   Ambulation/Gait Yes   Ambulation/Gait Assistance 6: Modified independent (Device/Increase time)   Ambulation/Gait Assistance Details cues to keep safe distance from W, pt tends to get to close increasing risk of posterior LOB.   Ambulation Distance (Feet) 140 Feet   Assistive device Rolling walker   Gait Pattern Step-to pattern   Ambulation Surface Level;Indoor   Gait Comments Parallel bars: forward gait with step to pattern due to lack of AROM in  left ankle as well as pain in left heel. Pt also complains of increased heel pain and fatigue in RLE with WB activities. Side stepping 10 ft with increased pain.    Knee/Hip Exercises: Standing   Knee Flexion Strengthening;Left;20 reps   Knee Flexion Limitations 3#   Knee/Hip Exercises: Seated   Long Arc Quad Strengthening;20 reps;Weights   Long Arc Quad Weight 3 lbs.   Cryotherapy   Number Minutes Cryotherapy 15 Minutes   Cryotherapy Location Ankle   Type of Cryotherapy --  vaso min pressure 32 degrees                   PT Short Term Goals - 01/04/15 2108    PT SHORT TERM GOAL #1   Title Pt will be I with  initial HEP for Rt. UE and LE weightbearing program, ROM and use of RICE   Time 4   Period Weeks   Status New   PT SHORT TERM GOAL #2   Title Pt will be able to walk with WBAT and boot as needed in and out of the home with mod pain overall.    Time 4   Period Weeks   Status New   PT SHORT TERM GOAL #3   Title Pt. will go up and down steps to access home with 1 rail, mod I and WB on LLE   Time 4   Period Weeks   Status New   PT SHORT TERM GOAL #4   Title Pt. will be able to use Rt. UE to groom, ADLs and report only min pain.    Time 4   Period Weeks   Status New           PT Long Term Goals - 01/04/15 2111    PT LONG TERM GOAL #1   Title Pt. will be I with advanced HEP for Rt. UE and Lt LE   Time 8   Period Weeks   Status New   PT LONG TERM GOAL #2   Title Pt. will be able to walk without boot and LRAD with min pain overall for 300 feet   Time 8   Period Weeks   Status New   PT LONG TERM GOAL #3   Title Pt. will DC use of wheelchair for mobility.    Time 8   Period Weeks   Status New   PT LONG TERM GOAL #4   Title Pt. will be able to reach overhead with min occ pain in Rt. UE   Time 8   Period Weeks   Status New   PT LONG TERM GOAL #5   Title Pt will demo 4+/5 strength in Rt. UE to improve lifting, carrying objects.    Time 8   Period Weeks   Status New               Plan - 02/01/15 1032    Clinical Impression Statement Representative from orthotics/prosthetics came today to assess pt for possible ankle brace. After clearance from MD, will proceed with trial  of ankle brace options.    PT Next Visit Plan add UE strength, cont pre-gait and manual L ankle, see if MD clearance received for ankle AFO trials        Problem List Patient Active Problem List   Diagnosis Date Noted  . Talus fracture 10/14/2014  . Preoperative clearance 04/12/2014  . Wound, surgical, infected 10/18/2013  . S/P laparoscopic assisted vaginal hysterectomy (LAVH) 01/21/2013   .  Menorrhagia with regular cycle 01/20/2013  . S/P endometrial ablation 01/20/2013  . OBSTRUCTIVE SLEEP APNEA 07/20/2009  . ALLERGIC RHINITIS 07/20/2009  . ASTHMA 07/20/2009  . DYSPNEA 07/20/2009    Dorene Ar, PTA 02/01/2015, 1:30 PM  [place], [place], [address] Phone: [phone]   Fax:  [phone]

## 2015-02-06 ENCOUNTER — Ambulatory Visit: Payer: Medicare Other | Admitting: Physical Therapy

## 2015-02-06 DIAGNOSIS — M25511 Pain in right shoulder: Secondary | ICD-10-CM

## 2015-02-06 DIAGNOSIS — R262 Difficulty in walking, not elsewhere classified: Secondary | ICD-10-CM | POA: Diagnosis not present

## 2015-02-06 DIAGNOSIS — M25611 Stiffness of right shoulder, not elsewhere classified: Secondary | ICD-10-CM

## 2015-02-06 DIAGNOSIS — M25672 Stiffness of left ankle, not elsewhere classified: Secondary | ICD-10-CM | POA: Diagnosis not present

## 2015-02-06 NOTE — Therapy (Signed)
[place], [place], [address] Phone: [phone]   Fax:  [phone]  Physical Therapy Treatment  Patient Details  Name: [name] MRN: [id number] Date of Birth: 01/23/1976 Referring Provider:  Leandrew Koyanagi, MD, Dr. Sharol Given  Encounter Date: 02/06/2015      PT End of Session - 02/06/15 1506    Visit Number 5   Number of Visits 16   Date for PT Re-Evaluation 03/01/15   PT Start Time 1413   PT Stop Time 1500   PT Time Calculation (min) 47 min   Activity Tolerance Patient limited by pain      Past Medical History  Diagnosis Date  . Depression   . Shortness of breath     on exertion  . Sleep apnea     mild per pt- no CPAP  . Headache(784.0)     migraines  . Anemia   . Asthma     no inhaler  . Menorrhagia with regular cycle 01/20/2013  . S/P endometrial ablation 01/20/2013  . S/P laparoscopic assisted vaginal hysterectomy (LAVH) 01/21/2013  . Preoperative clearance   . Hyperlipidemia     Past Surgical History  Procedure Laterality Date  . Tubal ligation    . Breast lumpectomy      RT side  . Laparoscopic assisted vaginal hysterectomy N/A 01/21/2013    Procedure: LAPAROSCOPIC ASSISTED VAGINAL HYSTERECTOMY;  Surgeon: Thornell Sartorius, MD;  Location: [facility];  Service: Gynecology;  Laterality: N/A;  . Bilateral salpingectomy N/A 01/21/2013    Procedure: BILATERAL SALPINGECTOMY;  Surgeon: Thornell Sartorius, MD;  Location: [facility];  Service: Gynecology;  Laterality: N/A;  . Abdominal hysterectomy    . Thyroid lobectomy Left 10/01/2013    Procedure: THYROID LOBECTOMY;  Surgeon: Madilyn Hook, DO;  Location: [facility];  Service: General;  Laterality: Left;  . I&d extremity Left 10/14/2014    Procedure: IRRIGATION AND DEBRIDEMENT OPEN TIBIA FRACTURE LEFT LEG;  Surgeon: Marianna Payment, MD;  Location: [facility];  Service: Orthopedics;  Laterality: Left;  . External fixation leg Left 10/14/2014    Procedure:  EXTERNAL FIXATION LEFT ANKLE;  Surgeon: Marianna Payment, MD;  Location: [facility];  Service: Orthopedics;  Laterality: Left;  . External fixation removal Left 10/19/2014    Procedure: REMOVAL EXTERNAL FIXATION LEG;  Surgeon: Newt Minion, MD;  Location: [facility];  Service: Orthopedics;  Laterality: Left;  . Ankle fusion Left 10/19/2014    Procedure: Tibiocalcaneal Fusion;  Surgeon: Newt Minion, MD;  Location: [facility];  Service: Orthopedics;  Laterality: Left;    There were no vitals filed for this visit.  Visit Diagnosis:  Shoulder joint pain, right  Difficulty in walking involving ankle and foot joint  Stiffness of joint, ankle and foot, left  Stiffness of joint, shoulder region, right      Subjective Assessment - 02/06/15 1414    Symptoms Ankle hurts a little today, no shoulder pain.   How long can you stand comfortably? stands but does not weight bear   How long can you walk comfortably? not walking around the house   Patient Stated Goals Pt would like to be able to walk again, have less pain    Currently in Pain? No/denies  none at rest, shoe on its a 6/10   Pain Score 6    Pain Location Ankle   Pain Orientation Left   Pain Descriptors / Indicators Sore   Pain Type Chronic pain   Pain  Onset More than a month ago   Pain Frequency Intermittent   Multiple Pain Sites No            [facility] PT Assessment - 02/06/15 1431    Assessment   Next MD Visit 02/13/15   AROM   Right Shoulder Flexion 150 Degrees   Right Shoulder Internal Rotation 70 Degrees   Right Shoulder External Rotation 85 Degrees  pain end range   Strength   Right Shoulder Flexion 5/5   Right Shoulder Internal Rotation 4+/5   Right Shoulder External Rotation 4/5   Ambulation/Gait   Ambulation/Gait Yes   Ambulation/Gait Assistance 6: Modified independent (Device/Increase time)   Ambulation/Gait Assistance Details cues for increasing WB in parallel bars   Balance   Balance Assessed Yes   Static Standing  Balance   Static Standing - Balance Support Right upper extremity supported   Static Standing - Level of Assistance 5: Stand by assistance   Static Standing Balance -  Activities  Single Leg Stance - Left Leg   Static Standing - Comment/# of Minutes 20-30 sec           [facility] Adult PT Treatment/Exercise - 02/06/15 1436    Knee/Hip Exercises: Supine   Bridges Strengthening;2 sets;10 reps   Straight Leg Raises Strengthening;Both;1 set;10 reps   Straight Leg Raises Limitations  4 way   ABD, ext, flexion and add   Shoulder Exercises: ROM/Strengthening   Other ROM/Strengthening Exercises supine horiz abd and ER/IR red band x 20 each    Manual Therapy   Manual Therapy Edema management;Passive ROM   Edema Management retrograde massage   Passive ROM toes and forefoot supination/pronation   Ambulation   Ambulation/Gait Assistance Details Tactile cues for weight shifting;Tactile cues for posture;Tactile cues for weight beaing                PT Education - 02/06/15 1505    Education provided Yes   Education Details self stretch and mobilization of toes, incr SB to WBAT and implications of fear avoidance, shoulder HEP   Person(s) Educated Patient   Methods Explanation;Demonstration;Handout   Comprehension Verbalized understanding;Returned demonstration;Need further instruction;Verbal cues required          PT Short Term Goals - 02/06/15 1453    PT SHORT TERM GOAL #1   Title Pt will be I with initial HEP for Rt. UE and LE weightbearing program, ROM and use of RICE   Time 4   PT SHORT TERM GOAL #2   Title Pt will be able to walk with WBAT and boot as needed in and out of the home with mod pain overall.    Status On-going   PT SHORT TERM GOAL #3   Title Pt. will go up and down steps to access home with 1 rail, mod I and WB on LLE   Status On-going   PT SHORT TERM GOAL #4   Title Pt. will be able to use Rt. UE to groom, ADLs and report only min pain.    Status On-going            PT Long Term Goals - 02/06/15 1511    PT LONG TERM GOAL #1   Title Pt. will be I with advanced HEP for Rt. UE and Lt LE   Status On-going   PT LONG TERM GOAL #2   Title Pt. will be able to walk without boot and LRAD with min pain overall for 300 feet   Status On-going  PT LONG TERM GOAL #3   Title Pt. will DC use of wheelchair for mobility.    Status On-going   PT LONG TERM GOAL #4   Title Pt. will be able to reach overhead with min occ pain in Rt. UE   Status Achieved   PT LONG TERM GOAL #5   Title Pt will demo 4+/5 strength in Rt. UE to improve lifting, carrying objects.    Status Partially Met               Plan - 02/06/15 1507    Clinical Impression Statement Patient with significant fear avoidance related to gait and WB on LLE, understandably.  She walks with a limp in the parallel bars, but does not use crutches into the clinic (uses WC).  She will not get UE PT due to priority of LE strength.    PT Next Visit Plan add UE strength, cont pre-gait and manual L ankle, see if MD clearance received for ankle AFO trials   PT Home Exercise Plan added ER and H ABD.    Consulted and Agree with Plan of Care Patient        Problem List Patient Active Problem List   Diagnosis Date Noted  . Talus fracture 10/14/2014  . Preoperative clearance 04/12/2014  . Wound, surgical, infected 10/18/2013  . S/P laparoscopic assisted vaginal hysterectomy (LAVH) 01/21/2013  . Menorrhagia with regular cycle 01/20/2013  . S/P endometrial ablation 01/20/2013  . OBSTRUCTIVE SLEEP APNEA 07/20/2009  . ALLERGIC RHINITIS 07/20/2009  . ASTHMA 07/20/2009  . DYSPNEA 07/20/2009    Nataliee Shurtz 02/06/2015, 3:32 PM  [facility] [address] [place], [place], [address] Phone: [phone]   Fax:  [phone]

## 2015-02-06 NOTE — Patient Instructions (Signed)
Resisted External Rotation: in Neutral - Bilateral   Sit or stand, tubing in both hands, elbows at sides, bent to 90, forearms forward. Pinch shoulder blades together and rotate forearms out. Keep elbows at sides. Repeat _10-20___ times per set. Do 1-2____ sets per session. Do __2__ sessions per day.  http://orth.exer.us/967   Copyright  VHI. All rights reserved.  Resisted Horizontal Abduction: Bilateral   Sit or stand, tubing in both hands, arms out in front. Keeping arms straight, pinch shoulder blades together and stretch arms out. Repeat _10-20___ times per set. Do _1-2___ sets per session. Do ___2_ sessions per day.  http://orth.exer.us/969   Copyright  VHI. All rights reserved.

## 2015-02-07 ENCOUNTER — Ambulatory Visit: Payer: Medicare Other | Admitting: Physical Therapy

## 2015-02-07 DIAGNOSIS — R262 Difficulty in walking, not elsewhere classified: Secondary | ICD-10-CM

## 2015-02-07 DIAGNOSIS — M25511 Pain in right shoulder: Secondary | ICD-10-CM

## 2015-02-07 DIAGNOSIS — M25611 Stiffness of right shoulder, not elsewhere classified: Secondary | ICD-10-CM | POA: Diagnosis not present

## 2015-02-07 DIAGNOSIS — M25672 Stiffness of left ankle, not elsewhere classified: Secondary | ICD-10-CM | POA: Diagnosis not present

## 2015-02-07 NOTE — Therapy (Signed)
[place], [place], [address] Phone: [phone]   Fax:  [phone]  Physical Therapy Treatment  Patient Details  Name: [name] MRN: [id number] Date of Birth: 03/25/1976 Referring Provider:  Leandrew Koyanagi, MD  Encounter Date: 02/07/2015      PT End of Session - 02/07/15 1529    Visit Number 6   Number of Visits 16   Date for PT Re-Evaluation 03/01/15   PT Start Time 1501   PT Stop Time 1620   PT Time Calculation (min) 79 min   Activity Tolerance Patient tolerated treatment well;Other (comment)  anxious      Past Medical History  Diagnosis Date  . Depression   . Shortness of breath     on exertion  . Sleep apnea     mild per pt- no CPAP  . Headache(784.0)     migraines  . Anemia   . Asthma     no inhaler  . Menorrhagia with regular cycle 01/20/2013  . S/P endometrial ablation 01/20/2013  . S/P laparoscopic assisted vaginal hysterectomy (LAVH) 01/21/2013  . Preoperative clearance   . Hyperlipidemia     Past Surgical History  Procedure Laterality Date  . Tubal ligation    . Breast lumpectomy      RT side  . Laparoscopic assisted vaginal hysterectomy N/A 01/21/2013    Procedure: LAPAROSCOPIC ASSISTED VAGINAL HYSTERECTOMY;  Surgeon: Thornell Sartorius, MD;  Location: [facility];  Service: Gynecology;  Laterality: N/A;  . Bilateral salpingectomy N/A 01/21/2013    Procedure: BILATERAL SALPINGECTOMY;  Surgeon: Thornell Sartorius, MD;  Location: [facility];  Service: Gynecology;  Laterality: N/A;  . Abdominal hysterectomy    . Thyroid lobectomy Left 10/01/2013    Procedure: THYROID LOBECTOMY;  Surgeon: Madilyn Hook, DO;  Location: [facility];  Service: General;  Laterality: Left;  . I&d extremity Left 10/14/2014    Procedure: IRRIGATION AND DEBRIDEMENT OPEN TIBIA FRACTURE LEFT LEG;  Surgeon: Marianna Payment, MD;  Location: [facility];  Service: Orthopedics;  Laterality: Left;  . External fixation leg Left  10/14/2014    Procedure: EXTERNAL FIXATION LEFT ANKLE;  Surgeon: Marianna Payment, MD;  Location: [facility];  Service: Orthopedics;  Laterality: Left;  . External fixation removal Left 10/19/2014    Procedure: REMOVAL EXTERNAL FIXATION LEG;  Surgeon: Newt Minion, MD;  Location: [facility];  Service: Orthopedics;  Laterality: Left;  . Ankle fusion Left 10/19/2014    Procedure: Tibiocalcaneal Fusion;  Surgeon: Newt Minion, MD;  Location: [facility];  Service: Orthopedics;  Laterality: Left;    There were no vitals filed for this visit.  Visit Diagnosis:  Shoulder joint pain, right  Difficulty in walking involving ankle and foot joint  Stiffness of joint, ankle and foot, left  Stiffness of joint, shoulder region, right      Subjective Assessment - 02/07/15 1524    Symptoms No pain while walking with boot. Was sore a couple of hours after PT yesterday. Has pain in Rt. knee sometimes and into the bottom of my foot.    Currently in Pain? No/denies            [facility] PT Assessment - 02/06/15 1431    Assessment   Next MD Visit 02/13/15   AROM   Right Shoulder Flexion 150 Degrees   Right Shoulder Internal Rotation 70 Degrees   Right Shoulder External Rotation 85 Degrees  pain end range   Strength   Right Shoulder  Flexion 5/5   Right Shoulder Internal Rotation 4+/5   Right Shoulder External Rotation 4/5   Ambulation/Gait   Ambulation/Gait Yes   Ambulation/Gait Assistance 6: Modified independent (Device/Increase time)   Ambulation/Gait Assistance Details cues for increasing WB in parallel bars   Balance   Balance Assessed Yes   Static Standing Balance   Static Standing - Balance Support Right upper extremity supported   Static Standing - Level of Assistance 5: Stand by assistance   Static Standing Balance -  Activities  Single Leg Stance - Left Leg   Static Standing - Comment/# of Minutes 20-30 sec                   [facility] Adult PT Treatment/Exercise - 02/07/15 1535    Knee/Hip  Exercises: Stretches   Active Hamstring Stretch 2 reps;30 seconds   Quad Stretch 2 reps;30 seconds   ITB Stretch 2 reps;30 seconds   Knee/Hip Exercises: Supine   Straight Leg Raises Strengthening;Both;1 set;10 reps   Straight Leg Raises Limitations --  flexion and abd and ext   Knee Flexion AAROM;Both;1 set   Knee Flexion Limitations PROM for quad stretch   Cryotherapy   Number Minutes Cryotherapy 15 Minutes   Cryotherapy Location Ankle   Type of Cryotherapy Other (comment)  vaso   Manual Therapy   Passive ROM hip flex   Ambulation   Ambulation/Gait Assistance Details Tactile cues for sequencing;Tactile cues for weight shifting;Tactile cues for weight beaing;Visual cues for safe use of DME/AE;Verbal cues for precautions/safety;Verbal cues for safe use of DME/AE  practiced gait on even surfaces and also stairs12 (min A)                PT Education - 02/07/15 1527    Education provided Yes   Education Details gait, stairs, wheelchair   Person(s) Educated Patient   Methods Explanation;Verbal cues;Demonstration   Comprehension Verbalized understanding          PT Short Term Goals - 02/07/15 1532    PT SHORT TERM GOAL #1   Title Pt will be I with initial HEP for Rt. UE and LE weightbearing program, ROM and use of RICE   Status Achieved   PT SHORT TERM GOAL #2   Title Pt will be able to walk with WBAT and boot as needed in and out of the home with mod pain overall.    Baseline met in clinic but avoids at home   Status Partially Met   PT [place] #3   Title Pt. will go up and down steps to access home with 1 rail, mod I and WB on LLE   Baseline can do in clinic but does not bear weight at home   Status Partially Met   PT SHORT TERM GOAL #4   Title Pt. will be able to use Rt. UE to groom, ADLs and report only min pain.    Status Achieved           PT Long Term Goals - 02/07/15 1533    PT LONG TERM GOAL #1   Title Pt. will be I with advanced HEP for Rt.  UE and Lt LE   Status On-going   PT LONG TERM GOAL #2   Title Pt. will be able to walk without boot and LRAD with min pain overall for 300 feet   Status On-going   PT LONG TERM GOAL #3   Title Pt. will DC use of wheelchair for mobility.  Status On-going   PT LONG TERM GOAL #4   Title Pt. will be able to reach overhead with min occ pain in Rt. UE   Status Achieved   PT LONG TERM GOAL #5   Title Pt will demo 4+/5 strength in Rt. UE to improve lifting, carrying objects.    Status Partially Met               Plan - 02/07/15 1530    Clinical Impression Statement Patient tolerated gait with crutches today, boot without pain.  She was told to use crutches and not the wheelchair next visit   PT Next Visit Plan cont LE strengthening   PT Home Exercise Plan as previous   Consulted and Agree with Plan of Care Patient        Problem List Patient Active Problem List   Diagnosis Date Noted  . Talus fracture 10/14/2014  . Preoperative clearance 04/12/2014  . Wound, surgical, infected 10/18/2013  . S/P laparoscopic assisted vaginal hysterectomy (LAVH) 01/21/2013  . Menorrhagia with regular cycle 01/20/2013  . S/P endometrial ablation 01/20/2013  . OBSTRUCTIVE SLEEP APNEA 07/20/2009  . ALLERGIC RHINITIS 07/20/2009  . ASTHMA 07/20/2009  . DYSPNEA 07/20/2009    Herb Beltre 02/07/2015, 4:33 PM  [facility] [address] [place], [place], [address] Phone: [phone]   Fax:  [phone]

## 2015-02-07 NOTE — Patient Instructions (Signed)
Wean off wheelchair!!

## 2015-02-13 ENCOUNTER — Ambulatory Visit: Payer: Medicare Other | Attending: Orthopedic Surgery | Admitting: Physical Therapy

## 2015-02-13 ENCOUNTER — Encounter: Payer: Self-pay | Admitting: Physical Therapy

## 2015-02-13 DIAGNOSIS — M25611 Stiffness of right shoulder, not elsewhere classified: Secondary | ICD-10-CM | POA: Insufficient documentation

## 2015-02-13 DIAGNOSIS — M25511 Pain in right shoulder: Secondary | ICD-10-CM | POA: Insufficient documentation

## 2015-02-13 DIAGNOSIS — M25672 Stiffness of left ankle, not elsewhere classified: Secondary | ICD-10-CM | POA: Diagnosis not present

## 2015-02-13 DIAGNOSIS — R262 Difficulty in walking, not elsewhere classified: Secondary | ICD-10-CM | POA: Insufficient documentation

## 2015-02-13 DIAGNOSIS — S92102D Unspecified fracture of left talus, subsequent encounter for fracture with routine healing: Secondary | ICD-10-CM | POA: Diagnosis not present

## 2015-02-13 NOTE — Therapy (Signed)
[place], [place], [address] Phone: [phone]   Fax:  [phone]  Physical Therapy Treatment  Patient Details  Name: [name] MRN: [id number] Date of Birth: [birth year] Referring Provider:  Newt Minion, MD  Encounter Date: 02/13/2015      PT End of Session - 02/13/15 1302    Visit Number 7   Number of Visits 16   Date for PT Re-Evaluation 03/01/15   PT Start Time 3094   PT Stop Time 0768   PT Time Calculation (min) 53 min   Activity Tolerance Patient tolerated treatment well;Patient limited by pain;Other (comment)  Dizzy during stair training   Behavior During Therapy WFL for tasks assessed/performed      Past Medical History  Diagnosis Date  . Depression   . Shortness of breath     on exertion  . Sleep apnea     mild per pt- no CPAP  . Headache(784.0)     migraines  . Anemia   . Asthma     no inhaler  . Menorrhagia with regular cycle 01/20/2013  . S/P endometrial ablation 01/20/2013  . S/P laparoscopic assisted vaginal hysterectomy (LAVH) 01/21/2013  . Preoperative clearance   . Hyperlipidemia     Past Surgical History  Procedure Laterality Date  . Tubal ligation    . Breast lumpectomy      RT side  . Laparoscopic assisted vaginal hysterectomy N/A 01/21/2013    Procedure: LAPAROSCOPIC ASSISTED VAGINAL HYSTERECTOMY;  Surgeon: Thornell Sartorius, MD;  Location: [facility];  Service: Gynecology;  Laterality: N/A;  . Bilateral salpingectomy N/A 01/21/2013    Procedure: BILATERAL SALPINGECTOMY;  Surgeon: Thornell Sartorius, MD;  Location: [facility];  Service: Gynecology;  Laterality: N/A;  . Abdominal hysterectomy    . Thyroid lobectomy Left 10/01/2013    Procedure: THYROID LOBECTOMY;  Surgeon: Madilyn Hook, DO;  Location[facility];  Service: General;  Laterality: Left;  . I&d extremity Left 10/14/2014    Procedure: IRRIGATION AND DEBRIDEMENT OPEN TIBIA FRACTURE LEFT LEG;  Surgeon: Marianna Payment, MD;   Location: [facility];  Service: Orthopedics;  Laterality: Left;  . External fixation leg Left 10/14/2014    Procedure: EXTERNAL FIXATION LEFT ANKLE;  Surgeon: Marianna Payment, MD;  Location: [facility];  Service: Orthopedics;  Laterality: Left;  . External fixation removal Left 10/19/2014    Procedure: REMOVAL EXTERNAL FIXATION LEG;  Surgeon: Newt Minion, MD;  Location: [facility];  Service: Orthopedics;  Laterality: Left;  . Ankle fusion Left 10/19/2014    Procedure: Tibiocalcaneal Fusion;  Surgeon: Newt Minion, MD;  Location: [facility];  Service: Orthopedics;  Laterality: Left;    There were no vitals filed for this visit.  Visit Diagnosis:  Difficulty in walking involving ankle and foot joint  Stiffness of joint, ankle and foot, left  Stiffness of joint, shoulder region, right      Subjective Assessment - 02/13/15 1255    Subjective --          [facility] Adult PT Treatment/Exercise - 02/13/15 1208    Ambulation/Gait   Ambulation/Gait Yes   Ambulation/Gait Assistance 6: Modified independent (Device/Increase time)   Ambulation/Gait Assistance Details vc's for equal wt bearing   Ambulation Distance (Feet) 150 Feet   Assistive device Crutches   Gait Pattern Step-through pattern   Ambulation Surface Level;Indoor   Stairs Yes   Stairs Assistance 4: Min assist   Stairs Assistance Details (indicate cue type and reason) used  rail, used both cruthes   Stair Management Technique One rail Right;Forwards;Step to pattern   Number of Stairs 5   Height of Stairs 6   Exercises   Exercises Ankle   Knee/Hip Exercises: Stretches   Active Hamstring Stretch 2 reps;30 seconds   Quad Stretch 2 reps;30 seconds   ITB Stretch 2 reps;30 seconds   Knee/Hip Exercises: Seated   Long Arc Quad Strengthening;Both;2 sets;15 reps;Weights  3 lbs   Long Arc Quad Weight 3 lbs.   Knee/Hip Exercises: Supine   Knee Extension Strengthening;2 sets;15 reps  3 lb ankle weight, both legs   Knee/Hip Exercises: Prone    Hamstring Curl 2 sets;15 reps  3 lbs ankle weight, both legs   Cryotherapy   Number Minutes Cryotherapy 10 Minutes   Cryotherapy Location Ankle   Type of Cryotherapy Ice pack   Manual Therapy   Manual Therapy Passive ROM   Ambulation   Stairs Assistance Details Tactile cues for sequencing;Tactile cues for placement;Visual cues for safe use of DME/AE   Ambulation/Gait Assistance Details Visual cues for safe use of DME/AE   Ankle Exercises: Stretches   Plantar Fascia Stretch 5 reps;20 seconds   Plantar Fascia Stretch Limitations pain   Other Stretch supine toe stretch with linen, 30 secx5   Ankle Exercises: Seated   Towel Crunch 5 reps  2 sets   Toe Raise 5 reps;3 seconds   Toe Raise Limitations pain          PT Education - 02/13/15 1301    Education provided Yes   Education Details HEP, stairs, scar management, toe AROM and stretches   Person(s) Educated Patient   Methods Explanation;Demonstration;Verbal cues;Tactile cues   Comprehension Verbalized understanding;Returned demonstration          PT Short Term Goals - 02/07/15 1532    PT SHORT TERM GOAL #1   Title Pt will be I with initial HEP for Rt. UE and LE weightbearing program, ROM and use of RICE   Status Achieved   PT SHORT TERM GOAL #2   Title Pt will be able to walk with WBAT and boot as needed in and out of the home with mod pain overall.    Baseline met in clinic but avoids at home   Status Partially Met   PT [place] #3   Title Pt. will go up and down steps to access home with 1 rail, mod I and WB on LLE   Baseline can do in clinic but does not bear weight at home   Status Partially Met   PT SHORT TERM GOAL #4   Title Pt. will be able to use Rt. UE to groom, ADLs and report only min pain.    Status Achieved           PT Long Term Goals - 02/07/15 1533    PT LONG TERM GOAL #1   Title Pt. will be I with advanced HEP for Rt. UE and Lt LE   Status On-going   PT LONG TERM GOAL #2   Title Pt.  will be able to walk without boot and LRAD with min pain overall for 300 feet   Status On-going   PT LONG TERM GOAL #3   Title Pt. will DC use of wheelchair for mobility.    Status On-going   PT LONG TERM GOAL #4   Title Pt. will be able to reach overhead with min occ pain in Rt. UE   Status Achieved  PT LONG TERM GOAL #5   Title Pt will demo 4+/5 strength in Rt. UE to improve lifting, carrying objects.    Status Partially Met          Plan - 02/13/15 1304    Clinical Impression Statement Pt uses cruthches, not wheelchair to get to the therapy sessions. Tolerated gait training and stair training well today, but displayed light dizziness during stair training. Limited range of LLE phalanges secondary to pain.       PT Next Visit Plan cont LE strengthening, toe ROM, and stair trainng    PT Home Exercise Plan as previous   Consulted and Agree with Plan of Care Patient     Update goals   Problem List Patient Active Problem List   Diagnosis Date Noted  . Talus fracture 10/14/2014  . Preoperative clearance 04/12/2014  . Wound, surgical, infected 10/18/2013  . S/P laparoscopic assisted vaginal hysterectomy (LAVH) 01/21/2013  . Menorrhagia with regular cycle 01/20/2013  . S/P endometrial ablation 01/20/2013  . OBSTRUCTIVE SLEEP APNEA 07/20/2009  . ALLERGIC RHINITIS 07/20/2009  . ASTHMA 07/20/2009  . DYSPNEA 07/20/2009    Ileana Roup 02/13/2015, 1:14 PM  [facility] [address] [place], [place], [address] Phone: [phone]   Fax:  [phone]

## 2015-02-15 ENCOUNTER — Ambulatory Visit: Payer: Medicare Other | Admitting: Physical Therapy

## 2015-02-15 DIAGNOSIS — M25511 Pain in right shoulder: Secondary | ICD-10-CM

## 2015-02-15 DIAGNOSIS — M25672 Stiffness of left ankle, not elsewhere classified: Secondary | ICD-10-CM

## 2015-02-15 DIAGNOSIS — M25611 Stiffness of right shoulder, not elsewhere classified: Secondary | ICD-10-CM | POA: Diagnosis not present

## 2015-02-15 DIAGNOSIS — R262 Difficulty in walking, not elsewhere classified: Secondary | ICD-10-CM

## 2015-02-16 NOTE — Therapy (Signed)
[place], [place], [address] Phone: [phone]   Fax:  [phone]  Physical Therapy Treatment  Patient Details  Name: [name] MRN: [id number] Date of Birth: 1976/06/22 Referring Provider:  Leandrew Koyanagi, MD  Encounter Date: 02/15/2015      PT End of Session - 02/16/15 1502    Visit Number 8   Number of Visits 16   Date for PT Re-Evaluation 03/01/15   PT Start Time 3557   PT Stop Time 1410   PT Time Calculation (min) 65 min   Equipment Utilized During Treatment Gait belt   Activity Tolerance Patient tolerated treatment well   Behavior During Therapy [facility] for tasks assessed/performed      Past Medical History  Diagnosis Date  . Depression   . Shortness of breath     on exertion  . Sleep apnea     mild per pt- no CPAP  . Headache(784.0)     migraines  . Anemia   . Asthma     no inhaler  . Menorrhagia with regular cycle 01/20/2013  . S/P endometrial ablation 01/20/2013  . S/P laparoscopic assisted vaginal hysterectomy (LAVH) 01/21/2013  . Preoperative clearance   . Hyperlipidemia     Past Surgical History  Procedure Laterality Date  . Tubal ligation    . Breast lumpectomy      RT side  . Laparoscopic assisted vaginal hysterectomy N/A 01/21/2013    Procedure: LAPAROSCOPIC ASSISTED VAGINAL HYSTERECTOMY;  Surgeon: Thornell Sartorius, MD;  Location: [facility];  Service: Gynecology;  Laterality: N/A;  . Bilateral salpingectomy N/A 01/21/2013    Procedure: BILATERAL SALPINGECTOMY;  Surgeon: Thornell Sartorius, MD;  Location: [facility];  Service: Gynecology;  Laterality: N/A;  . Abdominal hysterectomy    . Thyroid lobectomy Left 10/01/2013    Procedure: THYROID LOBECTOMY;  Surgeon: Madilyn Hook, DO;  Location: [facility];  Service: General;  Laterality: Left;  . I&d extremity Left 10/14/2014    Procedure: IRRIGATION AND DEBRIDEMENT OPEN TIBIA FRACTURE LEFT LEG;  Surgeon: Marianna Payment, MD;  Location: [place];  Service: Orthopedics;  Laterality: Left;  . External fixation leg Left 10/14/2014    Procedure: EXTERNAL FIXATION LEFT ANKLE;  Surgeon: Marianna Payment, MD;  Location: [place];  Service: Orthopedics;  Laterality: Left;  . External fixation removal Left 10/19/2014    Procedure: REMOVAL EXTERNAL FIXATION LEG;  Surgeon: Newt Minion, MD;  Location: [place];  Service: Orthopedics;  Laterality: Left;  . Ankle fusion Left 10/19/2014    Procedure: Tibiocalcaneal Fusion;  Surgeon: Newt Minion, MD;  Location: [place];  Service: Orthopedics;  Laterality: Left;    There were no vitals filed for this visit.  Visit Diagnosis:  Difficulty in walking involving ankle and foot joint  Stiffness of joint, ankle and foot, left  Stiffness of joint, shoulder region, right  Shoulder joint pain, right      Subjective Assessment - 02/16/15 1309    Subjective Pain in R heel continues, pain in L knee and L ankle  today due to walking around with her daughter in [place]. Pt denies shoulder pain.     Currently in Pain? Yes   Pain Score 8    Pain Location Ankle   Pain Orientation Left   Pain Descriptors / Indicators Throbbing   Pain Type Chronic pain;Surgical pain   Pain Radiating Towards left shin, left knee   Pain Onset More than a month ago  Pain Frequency Intermittent   Multiple Pain Sites No  pain in right heel wasn't rated          [facility] Adult PT Treatment/Exercise - 02/16/15 1314    Ambulation/Gait   Ambulation/Gait Yes   Ambulation/Gait Assistance 6: Modified independent (Device/Increase time)   Ambulation/Gait Assistance Details vc's for equal WB   Ambulation Distance (Feet) 200 Feet   Assistive device Crutches   Gait Pattern Step-through pattern   Ambulation Surface Level;Indoor   Stairs Yes   Stairs Assistance 4: Min assist   Stairs Assistance Details (indicate cue type and reason) gait belt for safety   Stair Management Technique No rails;With crutches   Number of  Stairs 5   Height of Stairs 6   Gait Comments waked 5 times up and down, endurance improves   Knee/Hip Exercises: Stretches   Passive Hamstring Stretch 2 reps;30 seconds   Cryotherapy   Number Minutes Cryotherapy 15 Minutes   Cryotherapy Location Ankle   Type of Cryotherapy Other (comment)  Vasopneumatic compression   Manual Therapy   Manual Therapy Passive ROM   Passive ROM toe flexion/extension   Ankle Exercises: Supine   Isometrics in all planes against PT resistance, 8 reps   Ambulation   Stairs Assistance Details Tactile cues for sequencing;Verbal cues for technique  Used parallell bars for gait training without crutches   Ankle Exercises: Stretches   Plantar Fascia Stretch 5 reps;10 seconds           PT Education - 02/16/15 1459    Education provided Yes   Education Details toe stretches, plantar fascia stretch    Person(s) Educated Patient   Methods Explanation;Demonstration   Comprehension Verbalized understanding;Returned demonstration          PT Short Term Goals - 02/16/15 1331    PT SHORT TERM GOAL #1   Title Pt will be I with initial HEP for Rt. UE and LE weightbearing program, ROM and use of RICE   Status Achieved   PT SHORT TERM GOAL #2   Title Pt will be able to walk with WBAT and boot as needed in and out of the home with mod pain overall.    Status Achieved   PT SHORT TERM GOAL #3   Title Pt. will go up and down steps to access home with 1 rail, mod I and WB on LLE   Status Partially Met   PT SHORT TERM GOAL #4   Title Pt. will be able to use Rt. UE to groom, ADLs and report only min pain.    Status Achieved           PT Long Term Goals - 02/16/15 1332    PT LONG TERM GOAL #1   Title Pt. will be I with advanced HEP for Rt. UE and Lt LE   Status On-going   PT LONG TERM GOAL #2   Title Pt. will be able to walk without boot and LRAD with min pain overall for 300 feet   Status On-going   PT LONG TERM GOAL #3   Title Pt. will DC use of  wheelchair for mobility.    Status Partially Met   PT LONG TERM GOAL #4   Title Pt. will be able to reach overhead with min occ pain in Rt. UE   Status On-going   PT LONG TERM GOAL #5   Title Pt will demo 4+/5 strength in Rt. UE to improve lifting, carrying objects.    Status  Partially Met           Plan - 02/16/15 1545    Clinical Impression Statement Pt performed better on the stairs training and parallel bars today, she felt less fatigued and did not have dizziness. Her LE strength continues to improve, needs to work on balance and weight bearing on LLE for improved mobility and gait.      PT Next Visit Plan cont LE strengthening, forefoot ROM, stair trainng, assess goals and hip and knee strength, update HEP   PT Home Exercise Plan as previous        Problem List Patient Active Problem List   Diagnosis Date Noted  . Talus fracture 10/14/2014  . Preoperative clearance 04/12/2014  . Wound, surgical, infected 10/18/2013  . S/P laparoscopic assisted vaginal hysterectomy (LAVH) 01/21/2013  . Menorrhagia with regular cycle 01/20/2013  . S/P endometrial ablation 01/20/2013  . OBSTRUCTIVE SLEEP APNEA 07/20/2009  . ALLERGIC RHINITIS 07/20/2009  . ASTHMA 07/20/2009  . DYSPNEA 07/20/2009    Ileana Roup 02/16/2015, 3:57 PM  [facility] [address] [place], [place], [address] Phone: [phone]   Fax:  [phone]

## 2015-02-21 ENCOUNTER — Telehealth: Payer: Self-pay | Admitting: *Deleted

## 2015-02-21 NOTE — Telephone Encounter (Signed)
Please give the patient a call.She has questions about the shoe fitting. Thanks

## 2015-02-22 ENCOUNTER — Ambulatory Visit: Payer: Medicare Other | Admitting: Physical Therapy

## 2015-02-22 DIAGNOSIS — R262 Difficulty in walking, not elsewhere classified: Secondary | ICD-10-CM | POA: Diagnosis not present

## 2015-02-22 DIAGNOSIS — M25672 Stiffness of left ankle, not elsewhere classified: Secondary | ICD-10-CM | POA: Diagnosis not present

## 2015-02-22 DIAGNOSIS — M25511 Pain in right shoulder: Secondary | ICD-10-CM | POA: Diagnosis not present

## 2015-02-22 DIAGNOSIS — M25611 Stiffness of right shoulder, not elsewhere classified: Secondary | ICD-10-CM | POA: Diagnosis not present

## 2015-02-22 NOTE — Therapy (Signed)
[place], [place], [address] Phone: [phone]   Fax:  [phone]  Physical Therapy Treatment  Patient Details  Name: [name] MRN: [id number] Date of Birth: 1976/06/21 Referring Provider:  Leandrew Koyanagi, MD  Encounter Date: 02/22/2015      PT End of Session - 02/22/15 1505    Visit Number 9   Number of Visits 16   Date for PT Re-Evaluation 03/01/15   PT Start Time 1017   PT Stop Time 1502   PT Time Calculation (min) 77 min   Behavior During Therapy [facility] for tasks assessed/performed      Past Medical History  Diagnosis Date  . Depression   . Shortness of breath     on exertion  . Sleep apnea     mild per pt- no CPAP  . Headache(784.0)     migraines  . Anemia   . Asthma     no inhaler  . Menorrhagia with regular cycle 01/20/2013  . S/P endometrial ablation 01/20/2013  . S/P laparoscopic assisted vaginal hysterectomy (LAVH) 01/21/2013  . Preoperative clearance   . Hyperlipidemia     Past Surgical History  Procedure Laterality Date  . Tubal ligation    . Breast lumpectomy      RT side  . Laparoscopic assisted vaginal hysterectomy N/A 01/21/2013    Procedure: LAPAROSCOPIC ASSISTED VAGINAL HYSTERECTOMY;  Surgeon: Thornell Sartorius, MD;  Location: [facility];  Service: Gynecology;  Laterality: N/A;  . Bilateral salpingectomy N/A 01/21/2013    Procedure: BILATERAL SALPINGECTOMY;  Surgeon: Thornell Sartorius, MD;  Location: [facility];  Service: Gynecology;  Laterality: N/A;  . Abdominal hysterectomy    . Thyroid lobectomy Left 10/01/2013    Procedure: THYROID LOBECTOMY;  Surgeon: Madilyn Hook, DO;  [facility];  Service: General;  Laterality: Left;  . I&d extremity Left 10/14/2014    Procedure: IRRIGATION AND DEBRIDEMENT OPEN TIBIA FRACTURE LEFT LEG;  Surgeon: Marianna Payment, MD;  Location: [place];  Service: Orthopedics;  Laterality: Left;  . External fixation leg Left 10/14/2014    Procedure:  EXTERNAL FIXATION LEFT ANKLE;  Surgeon: Marianna Payment, MD;  Location: [place];  Service: Orthopedics;  Laterality: Left;  . External fixation removal Left 10/19/2014    Procedure: REMOVAL EXTERNAL FIXATION LEG;  Surgeon: Newt Minion, MD;  Location: [place];  Service: Orthopedics;  Laterality: Left;  . Ankle fusion Left 10/19/2014    Procedure: Tibiocalcaneal Fusion;  Surgeon: Newt Minion, MD;  Location: [place];  Service: Orthopedics;  Laterality: Left;    There were no vitals filed for this visit.  Visit Diagnosis:  Difficulty in walking involving ankle and foot joint  Stiffness of joint, ankle and foot, left      Subjective Assessment - 02/22/15 1353    Subjective Pain in R knee is more intense today. Nathalia puts ice on it regularly throughout the day to decrease edema and inflammation. Pain in L ankle and in R heel possible due to increased weight bearing on the R leg.    Currently in Pain? Yes   Pain Score 4    Pain Location Knee   Pain Orientation Right   Pain Descriptors / Indicators Aching   Multiple Pain Sites Yes  Ankle pain is mild today, not rated          [facility] Adult PT Treatment/Exercise - 02/22/15 1400    Knee/Hip Exercises: Stretches   Active Hamstring Stretch 2 reps;30  seconds  both sides   ITB Stretch 2 reps;30 seconds  both legs   Knee/Hip Exercises: Aerobic   Stationary Bike NuStep 5 minutes, level 3   Knee/Hip Exercises: Supine   Hip Adduction Isometric Strengthening;Both;2 sets;10 reps  with a ball   Bridges Both;2 sets;10 reps   Straight Leg Raises Strengthening;Both;2 sets;15 reps  ankle weights, 3 lbs   Other Supine Knee Exercises SLR with leg in ER for VMO activation, R and L x 12, 2 sets   Knee/Hip Exercises: Sidelying   Hip ABduction Strengthening   Clams green band, 2 sets x 12, both sides   Cryotherapy   Number Minutes Cryotherapy 15 Minutes   Cryotherapy Location Ankle;Knee   Type of Cryotherapy Ice pack  Vasopneumatic compression on  the knee   Manual Therapy   Passive ROM toe extension           PT Education - 02/22/15 1500    Education provided Yes   Education Details encouragement for weight bearing on the left foot, avoid hopping on right leg - knee pain increase    Person(s) Educated Patient   Methods Explanation   Comprehension Verbalized understanding          PT Short Term Goals - 02/16/15 1331    PT SHORT TERM GOAL #1   Title Pt will be I with initial HEP for Rt. UE and LE weightbearing program, ROM and use of RICE   Status Achieved   PT SHORT TERM GOAL #2   Title Pt will be able to walk with WBAT and boot as needed in and out of the home with mod pain overall.    Status Achieved   PT SHORT TERM GOAL #3   Title Pt. will go up and down steps to access home with 1 rail, mod I and WB on LLE   Status Partially Met   PT SHORT TERM GOAL #4   Title Pt. will be able to use Rt. UE to groom, ADLs and report only min pain.    Status Achieved           PT Long Term Goals - 02/16/15 1332    PT LONG TERM GOAL #1   Title Pt. will be I with advanced HEP for Rt. UE and Lt LE   Status On-going   PT LONG TERM GOAL #2   Title Pt. will be able to walk without boot and LRAD with min pain overall for 300 feet   Status On-going   PT LONG TERM GOAL #3   Title Pt. will DC use of wheelchair for mobility.    Status Partially Met   PT LONG TERM GOAL #4   Title Pt. will be able to reach overhead with min occ pain in Rt. UE   Status On-going   PT LONG TERM GOAL #5   Title Pt will demo 4+/5 strength in Rt. UE to improve lifting, carrying objects.    Status Partially Met           Plan - 02/22/15 1507    Clinical Impression Statement Leydy has exacerbated pain in her R knee possibly due to hopping on it at home and bearing weight on it alone. Did not practice stairs today, did NuStep and mat exercises for hip and knee strengthening.      PT Next Visit Plan Stair training or Nustep, cont lower extremity  strengthening and stretching, asses goals   PT Home Exercise Plan as previous  Consulted and Agree with Plan of Care Patient        Problem List Patient Active Problem List   Diagnosis Date Noted  . Talus fracture 10/14/2014  . Preoperative clearance 04/12/2014  . Wound, surgical, infected 10/18/2013  . S/P laparoscopic assisted vaginal hysterectomy (LAVH) 01/21/2013  . Menorrhagia with regular cycle 01/20/2013  . S/P endometrial ablation 01/20/2013  . OBSTRUCTIVE SLEEP APNEA 07/20/2009  . ALLERGIC RHINITIS 07/20/2009  . ASTHMA 07/20/2009  . DYSPNEA 07/20/2009    PAA,JENNIFER 02/23/2015, 10:34 AM  [facility] [address] [place], [place], [address] Phone: [phone]   Fax:  [phone]

## 2015-02-23 DIAGNOSIS — S92109A Unspecified fracture of unspecified talus, initial encounter for closed fracture: Secondary | ICD-10-CM | POA: Diagnosis not present

## 2015-02-28 ENCOUNTER — Ambulatory Visit: Payer: Medicare Other | Admitting: Physical Therapy

## 2015-02-28 DIAGNOSIS — M25672 Stiffness of left ankle, not elsewhere classified: Secondary | ICD-10-CM

## 2015-02-28 DIAGNOSIS — M25511 Pain in right shoulder: Secondary | ICD-10-CM | POA: Diagnosis not present

## 2015-02-28 DIAGNOSIS — M25611 Stiffness of right shoulder, not elsewhere classified: Secondary | ICD-10-CM | POA: Diagnosis not present

## 2015-02-28 DIAGNOSIS — R262 Difficulty in walking, not elsewhere classified: Secondary | ICD-10-CM | POA: Diagnosis not present

## 2015-02-28 NOTE — Therapy (Signed)
[place], [place], [address] Phone: [phone]   Fax:  [phone]  Physical Therapy Treatment  Patient Details  Name: [name] MRN: [id number] Date of Birth: 1976-08-13 Referring Provider:  Leandrew Koyanagi, MD  Encounter Date: 02/28/2015      PT End of Session - 02/28/15 1000    Visit Number 10   Number of Visits 16   Date for PT Re-Evaluation 03/01/15   PT Start Time 7371   PT Stop Time 1033   PT Time Calculation (min) 58 min   Activity Tolerance Patient tolerated treatment well;Patient limited by pain;Patient limited by fatigue   Behavior During Therapy [name] for tasks assessed/performed      Past Medical History  Diagnosis Date  . Depression   . Shortness of breath     on exertion  . Sleep apnea     mild per pt- no CPAP  . Headache(784.0)     migraines  . Anemia   . Asthma     no inhaler  . Menorrhagia with regular cycle 01/20/2013  . S/P endometrial ablation 01/20/2013  . S/P laparoscopic assisted vaginal hysterectomy (LAVH) 01/21/2013  . Preoperative clearance   . Hyperlipidemia     Past Surgical History  Procedure Laterality Date  . Tubal ligation    . Breast lumpectomy      RT side  . Laparoscopic assisted vaginal hysterectomy N/A 01/21/2013    Procedure: LAPAROSCOPIC ASSISTED VAGINAL HYSTERECTOMY;  Surgeon: Mackenzie Sartorius, MD;  Location: [facility];  Service: Gynecology;  Laterality: N/A;  . Bilateral salpingectomy N/A 01/21/2013    Procedure: BILATERAL SALPINGECTOMY;  Surgeon: Mackenzie Sartorius, MD;  Location: [facility];  Service: Gynecology;  Laterality: N/A;  . Abdominal hysterectomy    . Thyroid lobectomy Left 10/01/2013    Procedure: THYROID LOBECTOMY;  Surgeon: Mackenzie Hook, DO;  [facility];  Service: General;  Laterality: Left;  . I&d extremity Left 10/14/2014    Procedure: IRRIGATION AND DEBRIDEMENT OPEN TIBIA FRACTURE LEFT LEG;  Surgeon: Mackenzie Payment, MD;  Location:  [place];  Service: Orthopedics;  Laterality: Left;  . External fixation leg Left 10/14/2014    Procedure: EXTERNAL FIXATION LEFT ANKLE;  Surgeon: Mackenzie Payment, MD;  Location: [place];  Service: Orthopedics;  Laterality: Left;  . External fixation removal Left 10/19/2014    Procedure: REMOVAL EXTERNAL FIXATION LEG;  Surgeon: Mackenzie Minion, MD;  Location: [place];  Service: Orthopedics;  Laterality: Left;  . Ankle fusion Left 10/19/2014    Procedure: Tibiocalcaneal Fusion;  Surgeon: Mackenzie Minion, MD;  Location: [place];  Service: Orthopedics;  Laterality: Left;    There were no vitals filed for this visit.  Visit Diagnosis:  Difficulty in walking involving ankle and foot joint  Stiffness of joint, ankle and foot, left      Subjective Assessment - 02/28/15 1028    Subjective R knee hurts at 6/10, L ankle 7/10. [name] got pain medication for her ankle, but didn't take it today yet. She is fearfull to bear wt on her LLE and continue to put most wt on RLE causing inflammation and irritation of the R knee.    Currently in Pain? Yes   Pain Score 7    Pain Location Ankle   Pain Orientation Left   Pain Descriptors / Indicators Aching   Pain Type Chronic pain;Surgical pain   Pain Onset More than a month ago   Pain Frequency Intermittent  increases  during WB   Multiple Pain Sites Yes   Pain Score 6   Pain Location Knee   Pain Orientation Right   Pain Descriptors / Indicators Aching;Constant  Edema   Pain Frequency Intermittent          [facility] Adult PT Treatment/Exercise - 02/28/15 0954    Knee/Hip Exercises: Standing   Forward Step Up Both;2 sets;5 reps;Hand Hold: 2;Step Height: 4";Limitations;Other (comment)  for stair training, limited to pain   Knee/Hip Exercises: Seated   Long Arc Quad Strengthening;Both;2 sets;15 reps;Weights   Long Arc Quad Weight 3 lbs.   Knee/Hip Exercises: Supine   Hip Adduction Isometric Strengthening;Both;2 sets;10 reps   Hip Adduction Isometric Limitations  hold 5 seconds   Straight Leg Raises Strengthening;Both;2 sets;15 reps   Straight Leg Raises Limitations 3 lb ankle wt   Straight Leg Raise with External Rotation Strengthening;Both;2 sets;10 reps   Other Supine Knee Exercises clams with blue band, 2 sets, 10 resp   Manual Therapy   Edema Management R knee KT   Passive ROM L toes extension          PT Education - 02/28/15 1056    Education provided Yes   Education Details Encouragement to keep exercises and keep progressing, pt is emotional about not functioning as hse used to   [employer]) Educated Patient   Methods Explanation   Comprehension Verbalized understanding          PT Short Term Goals - 02/16/15 1331    PT SHORT TERM GOAL #1   Title Pt will be I with initial HEP for Rt. UE and LE weightbearing program, ROM and use of RICE   Status Achieved   PT SHORT TERM GOAL #2   Title Pt will be able to walk with WBAT and boot as needed in and out of the home with mod pain overall.    Status Achieved   PT SHORT TERM GOAL #3   Title Pt. will go up and down steps to access home with 1 rail, mod I and WB on LLE   Status Partially Met   PT SHORT TERM GOAL #4   Title Pt. will be able to use Rt. UE to groom, ADLs and report only min pain.    Status Achieved          PT Long Term Goals - 02/16/15 1332    PT LONG TERM GOAL #1   Title Pt. will be I with advanced HEP for Rt. UE and Lt LE   Status On-going   PT LONG TERM GOAL #2   Title Pt. will be able to walk without boot and LRAD with min pain overall for 300 feet   Status On-going   PT LONG TERM GOAL #3   Title Pt. will DC use of wheelchair for mobility.    Status Partially Met   PT LONG TERM GOAL #4   Title Pt. will be able to reach overhead with min occ pain in Rt. UE   Status On-going   PT LONG TERM GOAL #5   Title Pt will demo 4+/5 strength in Rt. UE to improve lifting, carrying objects.    Status Partially Met           Plan - 02/28/15 1057    Clinical  Impression Statement [name] tolerated forward step exercises well today, she is limited by pain in her L heel of her foot and R knee. She did her exercises and we did kinesio tape on  her R knee for edema control. [name]'s POC is endidng tomorrow 03/01/15, renewal of plan on tomorrow's appointment.    PT Next Visit Plan Renew POC, update goals and HEP   PT Home Exercise Plan as previous   Consulted and Agree with Plan of Care Patient          G-Codes - 03-16-2015 1043    Functional Limitation --      Problem List Patient Active Problem List   Diagnosis Date Noted  . Talus fracture 10/14/2014  . Preoperative clearance 04/12/2014  . Wound, surgical, infected 10/18/2013  . S/P laparoscopic assisted vaginal hysterectomy (LAVH) 01/21/2013  . Menorrhagia with regular cycle 01/20/2013  . S/P endometrial ablation 01/20/2013  . OBSTRUCTIVE SLEEP APNEA 07/20/2009  . ALLERGIC RHINITIS 07/20/2009  . ASTHMA 07/20/2009  . DYSPNEA 07/20/2009    Ileana Roup Mar 16, 2015, 11:05 AM  [facility] [address] [place], [place], [address] Phone: [phone]   Fax:  [phone]

## 2015-03-01 ENCOUNTER — Ambulatory Visit: Payer: Medicare Other | Admitting: Physical Therapy

## 2015-03-01 ENCOUNTER — Encounter: Payer: Self-pay | Admitting: Physical Therapy

## 2015-03-01 DIAGNOSIS — M25611 Stiffness of right shoulder, not elsewhere classified: Secondary | ICD-10-CM

## 2015-03-01 DIAGNOSIS — R262 Difficulty in walking, not elsewhere classified: Secondary | ICD-10-CM | POA: Diagnosis not present

## 2015-03-01 DIAGNOSIS — M25672 Stiffness of left ankle, not elsewhere classified: Secondary | ICD-10-CM | POA: Diagnosis not present

## 2015-03-01 DIAGNOSIS — M25511 Pain in right shoulder: Secondary | ICD-10-CM | POA: Diagnosis not present

## 2015-03-01 NOTE — Therapy (Signed)
[place], [place], [address] Phone: [phone]   Fax:  [phone]  Physical Therapy Re-Evaluation  Patient Details  Name: [name] MRN: [id number] Date of Birth: 1975-11-19 Referring Provider:  Leandrew Koyanagi, MD  Encounter Date: 03/01/2015      PT End of Session - 03/01/15 1602    Visit Number 1   Number of Visits 8   Date for PT Re-Evaluation 03/29/15   PT Start Time 1054   PT Stop Time 1155   PT Time Calculation (min) 61 min   Activity Tolerance Patient limited by pain   Behavior During Therapy [facility] for tasks assessed/performed      Past Medical History  Diagnosis Date  . Depression   . Shortness of breath     on exertion  . Sleep apnea     mild per pt- no CPAP  . Headache(784.0)     migraines  . Anemia   . Asthma     no inhaler  . Menorrhagia with regular cycle 01/20/2013  . S/P endometrial ablation 01/20/2013  . S/P laparoscopic assisted vaginal hysterectomy (LAVH) 01/21/2013  . Preoperative clearance   . Hyperlipidemia     Past Surgical History  Procedure Laterality Date  . Tubal ligation    . Breast lumpectomy      RT side  . Laparoscopic assisted vaginal hysterectomy N/A 01/21/2013    Procedure: LAPAROSCOPIC ASSISTED VAGINAL HYSTERECTOMY;  Surgeon: Thornell Sartorius, MD;  Location: [place] [facility];  Service: Gynecology;  Laterality: N/A;  . Bilateral salpingectomy N/A 01/21/2013    Procedure: BILATERAL SALPINGECTOMY;  Surgeon: Thornell Sartorius, MD;  Location: [place] [facility];  Service: Gynecology;  Laterality: N/A;  . Abdominal hysterectomy    . Thyroid lobectomy Left 10/01/2013    Procedure: THYROID LOBECTOMY;  Surgeon: Madilyn Hook, DO;  Location: [facility];  Service: General;  Laterality: Left;  . I&d extremity Left 10/14/2014    Procedure: IRRIGATION AND DEBRIDEMENT OPEN TIBIA FRACTURE LEFT LEG;  Surgeon: Marianna Payment, MD;  Location: [place];  Service: Orthopedics;  Laterality: Left;  .  External fixation leg Left 10/14/2014    Procedure: EXTERNAL FIXATION LEFT ANKLE;  Surgeon: Marianna Payment, MD;  Location: [place];  Service: Orthopedics;  Laterality: Left;  . External fixation removal Left 10/19/2014    Procedure: REMOVAL EXTERNAL FIXATION LEG;  Surgeon: Newt Minion, MD;  Location: [place];  Service: Orthopedics;  Laterality: Left;  . Ankle fusion Left 10/19/2014    Procedure: Tibiocalcaneal Fusion;  Surgeon: Newt Minion, MD;  Location: [place];  Service: Orthopedics;  Laterality: Left;    There were no vitals filed for this visit.  Visit Diagnosis:  Difficulty in walking involving ankle and foot joint - Plan: PT plan of care cert/re-cert  Stiffness of joint, shoulder region, right - Plan: PT plan of care cert/re-cert  Stiffness of joint, ankle and foot, left - Plan: PT plan of care cert/re-cert      Subjective Assessment - 03/01/15 1525    Subjective [name] states that she is improving and needs to continue therapy to improve strength, ROM, and weight bearing ability of L ankle. She feels like when she will get AFO for her ankle she will be able to bear more weight on it.       Pertinent History fell 12/4 with irrigation and debridement for open tibia fx with ORIF by Dr. Erlinda Hong on 12/4/5 and then on 12/9/1 Dr. Sharol Given removed external fixator  and fused ankle.  DC from [facility] 10/25/14.  Rt. foot surgery.  "years ago"   Limitations Lifting;Standing;Walking;House hold activities   How long can you stand comfortably? stands with min weight bearing   How long can you walk comfortably? 5-10 minutes with min weight bearing (15-20%)   Patient Stated Goals Pt would like to be able to walk again, have less pain    Currently in Pain? Yes   Pain Score 6    Pain Location Ankle   Pain Orientation Left   Pain Descriptors / Indicators Aching   Pain Type Chronic pain;Surgical pain   Pain Radiating Towards left shin   Pain Onset More than a month ago   Pain Frequency Intermittent    Aggravating Factors  weight bearing on LE   Pain Relieving Factors medication, ice   Effect of Pain on Daily Activities Limited in most ADL's   Multiple Pain Sites No            [facility] PT Assessment - 03/01/15 1535    AROM   Right/Left Ankle --  R WNL all planes   Left Ankle Dorsiflexion 0   Left Ankle Plantar Flexion 0   Left Ankle Inversion 10   Left Ankle Eversion 12   Strength   Right Shoulder Flexion 4/5   Right Shoulder ABduction 4/5   Right Hip Flexion 4+/5   Right Hip Extension 5/5   Right Hip ABduction 4+/5   Right Hip ADduction 4/5   Left Hip Flexion 4+/5   Right Knee Flexion 5/5   Right Knee Extension 5/5   Left Knee Flexion 5/5   Left Knee Extension 5/5   Right Ankle Dorsiflexion 5/5   Right Ankle Plantar Flexion 5/5   Right Ankle Inversion 5/5   Right Ankle Eversion 5/5   Left Ankle Dorsiflexion 1/5   Left Ankle Plantar Flexion 2-/5   Left Ankle Inversion 3+/5   Left Ankle Eversion 3/5  limited by pain         [facility] Adult PT Treatment/Exercise - 03/01/15 1547    Knee/Hip Exercises: Supine   Straight Leg Raise with External Rotation Strengthening;2 sets;Both;10 reps   Knee/Hip Exercises: Sidelying   Hip ABduction Strengthening;Both;2 sets;10 reps   Knee/Hip Exercises: Prone   Hamstring Curl 2 sets;15 reps;1 second   Straight Leg Raises Strengthening;2 sets;Both;15 reps   Ankle Exercises: Supine   Isometrics DF/ PF x 10 reps, 2 sets  Left     Had extensive conversation with pt on her psychological state and how it affects her physical well being. She agrees that she is depressed and has not accepted her injury and current level of function. Her MD was made aware of need for AFO, [name] is currently in the process of getting an appointment to purchase it. She will need guidance with gait once she obtains AFO.       PT Education - 03/01/15 1550    Education provided Yes   Education Details AFO   Person(s) Educated Patient   Methods Explanation    Comprehension Verbalized understanding          PT Short Term Goals - 03/01/15 1551    PT SHORT TERM GOAL #1   Title Pt will be I with initial HEP for Rt. UE and LE weightbearing program, ROM and use of RICE   Status Achieved   PT SHORT TERM GOAL #2   Title Pt will be able to walk with WBAT and boot as needed in  and out of the home with mod pain overall.    Status On-going   PT SHORT TERM GOAL #3   Title Pt. will go up and down steps to access home with 1 rail, mod I and WB on LLE   Status Achieved   PT SHORT TERM GOAL #4   Title Pt. will be able to use Rt. UE to groom, ADLs and report only min pain.    Status Achieved           PT Long Term Goals - 03-28-15 1554    PT LONG TERM GOAL #1   Title Pt. will be I with advanced HEP for Rt. UE and Lt LE   Status On-going   PT LONG TERM GOAL #2   Title Pt. will be able to walk without boot and LRAD with min pain overall for 300 feet   Status On-going   PT LONG TERM GOAL #3   Title Pt. will DC use of wheelchair for mobility.    Baseline uses at home in the kitchen   Status Partially Met   PT LONG TERM GOAL #4   Title Pt. will be able to reach overhead with min occ pain in Rt. UE   Status On-going   PT LONG TERM GOAL #5   Title Pt will demo 4+/5 strength in Rt. UE to improve lifting, carrying objects.    Status On-going   Additional Long Term Goals   Additional Long Term Goals Yes   PT LONG TERM GOAL #6   Title Pt will improve PF strength to 4/5 within available range to improve ankle joint support   Time 4   Period Weeks   Status New   PT LONG TERM GOAL #7   Title Pt will iniate weight bearing without PT cueing    Time 4   Period Weeks   Status New               Plan - 03-28-15 1604    Clinical Impression Statement [name]'s POC was extended for 4 more weeks in order to provide gait training with  AFO and improve extrinsic and intrisic muscles of left ankle. [name]'s hip and knee strength is WNL. She is fearful  to bear weight on LLE due to severe pain in left heel at the point of the srew placement. [name] states that PT sessions help her to improve function, mobility, and stimulate her to keep going.      Pt will benefit from skilled therapeutic intervention in order to improve on the following deficits Abnormal gait;Decreased coordination;Decreased range of motion;Difficulty walking;Impaired flexibility;Impaired sensation;Increased edema;Postural dysfunction;Decreased activity tolerance;Decreased balance;Decreased scar mobility;Increased muscle spasms;Impaired UE functional use;Pain;Decreased mobility;Decreased strength   Rehab Potential Good   PT Frequency 2x / week   PT Duration 4 weeks   PT Treatment/Interventions ADLs/Self Care Home Management;Moist Heat;Therapeutic activities;Patient/family education;Passive range of motion;Therapeutic exercise;DME Instruction;Ultrasound;Gait training;Balance training;Manual techniques;Cryotherapy;Stair training;Neuromuscular re-education;Electrical Stimulation;Functional mobility training;Other (comment)   PT Next Visit Plan NuStep, work on left ankle musculature (isometric stretngthening), cont knee and hip strengthening, update HEP (include left ankle strength ex)   PT Home Exercise Plan as previous   Consulted and Agree with Plan of Care Patient          G-Codes - 03/28/2015 1600    Functional Limitation Mobility: Walking and moving around   Mobility: Walking and Moving Around Current Status (T0160) At least 80 percent but less than 100 percent impaired, limited or restricted   Mobility:  Walking and Moving Around Goal Status [phone]) At least 60 percent but less than 80 percent impaired, limited or restricted       Problem List Patient Active Problem List   Diagnosis Date Noted  . Talus fracture 10/14/2014  . Preoperative clearance 04/12/2014  . Wound, surgical, infected 10/18/2013  . S/P laparoscopic assisted vaginal hysterectomy (LAVH) 01/21/2013  .  Menorrhagia with regular cycle 01/20/2013  . S/P endometrial ablation 01/20/2013  . OBSTRUCTIVE SLEEP APNEA 07/20/2009  . ALLERGIC RHINITIS 07/20/2009  . ASTHMA 07/20/2009  . DYSPNEA 07/20/2009    Ileana Roup 03/01/2015, 4:16 PM  [facility] [address] [place], [place], [address] Phone: [phone]   Fax:  [phone]

## 2015-03-07 ENCOUNTER — Ambulatory Visit: Payer: Medicare Other | Admitting: Physical Therapy

## 2015-03-07 DIAGNOSIS — R262 Difficulty in walking, not elsewhere classified: Secondary | ICD-10-CM

## 2015-03-07 DIAGNOSIS — M25672 Stiffness of left ankle, not elsewhere classified: Secondary | ICD-10-CM

## 2015-03-07 DIAGNOSIS — M25611 Stiffness of right shoulder, not elsewhere classified: Secondary | ICD-10-CM | POA: Diagnosis not present

## 2015-03-07 DIAGNOSIS — M25511 Pain in right shoulder: Secondary | ICD-10-CM | POA: Diagnosis not present

## 2015-03-07 NOTE — Therapy (Signed)
[place], [place], [address] Phone: [phone]   Fax:  [phone]  Physical Therapy Treatment  Patient Details  Name: [name] MRN: [id number] Date of Birth: [birth year] Referring Provider:  Leandrew Koyanagi, MD  Encounter Date: 03/07/2015      PT End of Session - 03/07/15 1131    Visit Number 12   Number of Visits 24   Date for PT Re-Evaluation 03/29/15   PT Start Time 0932   PT Stop Time 1015   PT Time Calculation (min) 43 min   Activity Tolerance Patient tolerated treatment well;Patient limited by pain   Behavior During Therapy [facility] for tasks assessed/performed      Past Medical History  Diagnosis Date  . Depression   . Shortness of breath     on exertion  . Sleep apnea     mild per pt- no CPAP  . Headache(784.0)     migraines  . Anemia   . Asthma     no inhaler  . Menorrhagia with regular cycle 01/20/2013  . S/P endometrial ablation 01/20/2013  . S/P laparoscopic assisted vaginal hysterectomy (LAVH) 01/21/2013  . Preoperative clearance   . Hyperlipidemia     Past Surgical History  Procedure Laterality Date  . Tubal ligation    . Breast lumpectomy      RT side  . Laparoscopic assisted vaginal hysterectomy N/A 01/21/2013    Procedure: LAPAROSCOPIC ASSISTED VAGINAL HYSTERECTOMY;  Surgeon: Thornell Sartorius, MD;  Location: [place] [facility];  Service: Gynecology;  Laterality: N/A;  . Bilateral salpingectomy N/A 01/21/2013    Procedure: BILATERAL SALPINGECTOMY;  Surgeon: Thornell Sartorius, MD;  Location: [place] [facility];  Service: Gynecology;  Laterality: N/A;  . Abdominal hysterectomy    . Thyroid lobectomy Left 10/01/2013    Procedure: THYROID LOBECTOMY;  Surgeon: Madilyn Hook, DO;  [facility];  Service: General;  Laterality: Left;  . I&d extremity Left 10/14/2014    Procedure: IRRIGATION AND DEBRIDEMENT OPEN TIBIA FRACTURE LEFT LEG;  Surgeon: Marianna Payment, MD;  Location: [place];  Service:  Orthopedics;  Laterality: Left;  . External fixation leg Left 10/14/2014    Procedure: EXTERNAL FIXATION LEFT ANKLE;  Surgeon: Marianna Payment, MD;  Location: [place];  Service: Orthopedics;  Laterality: Left;  . External fixation removal Left 10/19/2014    Procedure: REMOVAL EXTERNAL FIXATION LEG;  Surgeon: Newt Minion, MD;  Location: [place];  Service: Orthopedics;  Laterality: Left;  . Ankle fusion Left 10/19/2014    Procedure: Tibiocalcaneal Fusion;  Surgeon: Newt Minion, MD;  Location: [place];  Service: Orthopedics;  Laterality: Left;    There were no vitals filed for this visit.  Visit Diagnosis:  Difficulty in walking involving ankle and foot joint  Stiffness of joint, ankle and foot, left      Subjective Assessment - 03/07/15 0942    Subjective Pt has appt with [facility] tomorrow.  Wears boot when out of house.  Does not bear weight in the house. Scoots down steps and hops up the steps, uses wheelchair.    Currently in Pain? Yes   Pain Score 4    Pain Location Ankle   Pain Orientation Left   Aggravating Factors  WB on LLE   Pain Relieving Factors meds, rest, ice            [facility] PT Assessment - 03/07/15 0949    Strength   Right Knee Extension 5/5   Left Knee  Flexion 4/5   Left Knee Extension 4/5  mid range weakness            [facility] Adult PT Treatment/Exercise - 03/07/15 0946    Knee/Hip Exercises: Aerobic   Stationary Bike NuStep L5 legs only   Knee/Hip Exercises: Seated   Long Arc Quad Strengthening;Left;1 set;20 reps   Long Arc Quad Weight 4 lbs.   Knee/Hip Exercises: Prone   Hamstring Curl 1 set;20 reps   Hamstring Curl Limitations 4   Hip Extension Strengthening;1 set;20 reps   Hip Extension Limitations 4   Other Prone Exercises bent knee hip ext x 10 no weight   Manual Therapy   Manual Therapy Joint mobilization   Joint Mobilization L ankle mobs to increased PF and EV   Passive ROM all planes including toe ext.    Ankle Exercises: Stretches    Plantar Fascia Stretch 5 reps;10 seconds  added supination/pronation    Pilates Reformer used for LE/core strength, postural strength, lumbopelvic disassociation and core control.  Exercises included: Footwork 2 Red and 1 Blue: Heels, Arches and Ball of foot, x 10-20 x each, cues for alignment and full leg extension LLE single leg press with 2 Red springs done on heel x 20  . Min pain Bilateral heel lower/lift for ankle ROM/mobilization, minimal ROM in L ankle, max cueing.            PT Education - 03/07/15 1130    Education provided Yes   Education Details PIlates Reformer, AFO, gait   Person(s) Educated Patient   Methods Explanation;Demonstration   Comprehension Verbalized understanding;Tactile cues required;Verbal cues required;Need further instruction          PT Short Term Goals - 03/07/15 1316    PT SHORT TERM GOAL #1   Title Pt will be I with initial HEP for Rt. UE and LE weightbearing program, ROM and use of RICE   Status Achieved   PT SHORT TERM GOAL #2   Title Pt will be able to walk with WBAT and boot as needed in and out of the home with mod pain overall.    Status Partially Met   PT SHORT TERM GOAL #3   Title Pt. will go up and down steps to access home with 1 rail, mod I and WB on LLE   Status Achieved   PT SHORT TERM GOAL #4   Title Pt. will be able to use Rt. UE to groom, ADLs and report only min pain.    Status Achieved           PT Long Term Goals - 03/07/15 1316    PT LONG TERM GOAL #1   Title Pt. will be I with advanced HEP for Rt. UE and Lt LE   Status On-going   PT LONG TERM GOAL #2   Title Pt. will be able to walk without boot and LRAD with min pain overall for 300 feet   Status On-going   PT LONG TERM GOAL #3   Title Pt. will DC use of wheelchair for mobility.    Status Partially Met   PT LONG TERM GOAL #4   Title Pt. will be able to reach overhead with min occ pain in Rt. UE   Status On-going   PT LONG TERM GOAL #5   Title Pt  will demo 4+/5 strength in Rt. UE to improve lifting, carrying objects.    Status On-going   PT LONG TERM GOAL #6   Title Pt  will improve PF strength to 4/5 within available range to improve ankle joint support   Status On-going   PT LONG TERM GOAL #7   Title Pt will iniate weight bearing without PT cueing    Status On-going               Plan - 03/07/15 1132    Clinical Impression Statement [name] continues to struggle with mobility due to pain in heel with WB. She did have some success with using the Pilates Reformer, as she as able to tolerate weight bearing through her LLE with min pain increase.  She will benefit from more intense joint mobilization and continued work on the Refomer.     PT Next Visit Plan manual, reformer, cont strength   PT Home Exercise Plan as previous        Problem List Patient Active Problem List   Diagnosis Date Noted  . Talus fracture 10/14/2014  . Preoperative clearance 04/12/2014  . Wound, surgical, infected 10/18/2013  . S/P laparoscopic assisted vaginal hysterectomy (LAVH) 01/21/2013  . Menorrhagia with regular cycle 01/20/2013  . S/P endometrial ablation 01/20/2013  . OBSTRUCTIVE SLEEP APNEA 07/20/2009  . ALLERGIC RHINITIS 07/20/2009  . ASTHMA 07/20/2009  . DYSPNEA 07/20/2009    [name] 03/07/2015, 1:20 PM  [facility] [address] [place], [place], [address] Phone: [phone]   Fax:  [phone]

## 2015-03-13 DIAGNOSIS — M25561 Pain in right knee: Secondary | ICD-10-CM | POA: Diagnosis not present

## 2015-03-13 DIAGNOSIS — S92102D Unspecified fracture of left talus, subsequent encounter for fracture with routine healing: Secondary | ICD-10-CM | POA: Diagnosis not present

## 2015-03-13 DIAGNOSIS — M25572 Pain in left ankle and joints of left foot: Secondary | ICD-10-CM | POA: Diagnosis not present

## 2015-03-14 ENCOUNTER — Ambulatory Visit: Payer: Medicare Other | Attending: Orthopedic Surgery

## 2015-03-14 DIAGNOSIS — M25511 Pain in right shoulder: Secondary | ICD-10-CM | POA: Insufficient documentation

## 2015-03-14 DIAGNOSIS — M25672 Stiffness of left ankle, not elsewhere classified: Secondary | ICD-10-CM | POA: Insufficient documentation

## 2015-03-14 DIAGNOSIS — M25611 Stiffness of right shoulder, not elsewhere classified: Secondary | ICD-10-CM | POA: Insufficient documentation

## 2015-03-14 DIAGNOSIS — R262 Difficulty in walking, not elsewhere classified: Secondary | ICD-10-CM | POA: Insufficient documentation

## 2015-03-17 ENCOUNTER — Ambulatory Visit: Payer: Medicare Other | Admitting: Physical Therapy

## 2015-03-17 DIAGNOSIS — M25672 Stiffness of left ankle, not elsewhere classified: Secondary | ICD-10-CM | POA: Diagnosis not present

## 2015-03-17 DIAGNOSIS — M25511 Pain in right shoulder: Secondary | ICD-10-CM | POA: Diagnosis not present

## 2015-03-17 DIAGNOSIS — M25611 Stiffness of right shoulder, not elsewhere classified: Secondary | ICD-10-CM | POA: Diagnosis not present

## 2015-03-17 DIAGNOSIS — R262 Difficulty in walking, not elsewhere classified: Secondary | ICD-10-CM | POA: Diagnosis not present

## 2015-03-17 NOTE — Therapy (Signed)
[place], [place], [address] Phone: [phone]   Fax:  [phone]  Physical Therapy Treatment  Patient Details  Name: [name] MRN: [id number] Date of Birth: 1976-02-29 Referring Provider:  Leandrew Koyanagi, MD  Encounter Date: 03/17/2015      PT End of Session - 03/17/15 1100    Visit Number 13   Number of Visits 24   Date for PT Re-Evaluation 03/29/15   PT Start Time 0923   PT Stop Time 1105   PT Time Calculation (min) 50 min   Activity Tolerance Patient tolerated treatment well  pain 8/10 post      Past Medical History  Diagnosis Date  . Depression   . Shortness of breath     on exertion  . Sleep apnea     mild per pt- no CPAP  . Headache(784.0)     migraines  . Anemia   . Asthma     no inhaler  . Menorrhagia with regular cycle 01/20/2013  . S/P endometrial ablation 01/20/2013  . S/P laparoscopic assisted vaginal hysterectomy (LAVH) 01/21/2013  . Preoperative clearance   . Hyperlipidemia     Past Surgical History  Procedure Laterality Date  . Tubal ligation    . Breast lumpectomy      RT side  . Laparoscopic assisted vaginal hysterectomy N/A 01/21/2013    Procedure: LAPAROSCOPIC ASSISTED VAGINAL HYSTERECTOMY;  Surgeon: Mackenzie Sartorius, MD;  Location: [place] [facility];  Service: Gynecology;  Laterality: N/A;  . Bilateral salpingectomy N/A 01/21/2013    Procedure: BILATERAL SALPINGECTOMY;  Surgeon: Mackenzie Sartorius, MD;  Location: [place] [facility];  Service: Gynecology;  Laterality: N/A;  . Abdominal hysterectomy    . Thyroid lobectomy Left 10/01/2013    Procedure: THYROID LOBECTOMY;  Surgeon: Mackenzie Hook, DO;  [facility];  Service: General;  Laterality: Left;  . I&d extremity Left 10/14/2014    Procedure: IRRIGATION AND DEBRIDEMENT OPEN TIBIA FRACTURE LEFT LEG;  Surgeon: Mackenzie Payment, MD;  Location: [place];  Service: Orthopedics;  Laterality: Left;  . External fixation leg Left 10/14/2014   Procedure: EXTERNAL FIXATION LEFT ANKLE;  Surgeon: Mackenzie Payment, MD;  Location: [place];  Service: Orthopedics;  Laterality: Left;  . External fixation removal Left 10/19/2014    Procedure: REMOVAL EXTERNAL FIXATION LEG;  Surgeon: Mackenzie Minion, MD;  Location: [place];  Service: Orthopedics;  Laterality: Left;  . Ankle fusion Left 10/19/2014    Procedure: Tibiocalcaneal Fusion;  Surgeon: Mackenzie Minion, MD;  Location: [place];  Service: Orthopedics;  Laterality: Left;    There were no vitals filed for this visit.  Visit Diagnosis:  Difficulty in walking involving ankle and foot joint  Stiffness of joint, ankle and foot, left  Stiffness of joint, shoulder region, right      Subjective Assessment - 03/17/15 1057    Subjective No pain today. Walks with crutches, has been walking up steps at home. Got new sneakers for AFO (will be here 03/30/15)   Currently in Pain? No/denies           PT Education - 03/17/15 1100    Education provided Yes   Education Details mobilizations for ankle ROM   Person(s) Educated Patient   Methods Explanation;Demonstration   Comprehension Verbalized understanding          PT Short Term Goals - 03/17/15 1100    PT SHORT TERM GOAL #1   Title Pt will be I with initial HEP  for Rt. UE and LE weightbearing program, ROM and use of RICE   Status Achieved   PT SHORT TERM GOAL #2   Title Pt will be able to walk with WBAT and boot as needed in and out of the home with mod pain overall.    Status Achieved   PT SHORT TERM GOAL #3   Title Pt. will go up and down steps to access home with 1 rail, mod I and WB on LLE   Status Achieved   PT SHORT TERM GOAL #4   Title Pt. will be able to use Rt. UE to groom, ADLs and report only min pain.    Status Achieved           PT Long Term Goals - 03/17/15 1058    PT LONG TERM GOAL #1   Title Pt. will be I with advanced HEP for Rt. UE and Lt LE   Status On-going   PT LONG TERM GOAL #2   Title Pt. will be able to  walk without boot and LRAD with min pain overall for 300 feet   Status On-going   PT LONG TERM GOAL #3   Title Pt. will DC use of wheelchair for mobility.    Status Partially Met   PT LONG TERM GOAL #4   Title Pt. will be able to reach overhead with min occ pain in Rt. UE   Status Partially Met   PT LONG TERM GOAL #5   Title Pt will demo 4+/5 strength in Rt. UE to improve lifting, carrying objects.    Status On-going   PT LONG TERM GOAL #6   Title Pt will improve PF strength to 4/5 within available range to improve ankle joint support   Status On-going   PT LONG TERM GOAL #7   Title Pt will iniate weight bearing without PT cueing    Status Partially Met               Plan - 03/17/15 1101    Clinical Impression Statement Pain increased after PT.  Seems to be WB more at home, still very limited with mobility.  WIll see until she gets AFO for gait training.    PT Next Visit Plan manual, reformer, cont strength   PT Home Exercise Plan as previous   Consulted and Agree with Plan of Care Patient        Problem List Patient Active Problem List   Diagnosis Date Noted  . Talus fracture 10/14/2014  . Preoperative clearance 04/12/2014  . Wound, surgical, infected 10/18/2013  . S/P laparoscopic assisted vaginal hysterectomy (LAVH) 01/21/2013  . Menorrhagia with regular cycle 01/20/2013  . S/P endometrial ablation 01/20/2013  . OBSTRUCTIVE SLEEP APNEA 07/20/2009  . ALLERGIC RHINITIS 07/20/2009  . ASTHMA 07/20/2009  . DYSPNEA 07/20/2009    [name] 03/17/2015, 11:07 AM  [facility] [address] [place], [place], [address] Phone: [phone]   Fax:  [phone]

## 2015-03-20 ENCOUNTER — Ambulatory Visit: Payer: Medicare Other | Admitting: Physical Therapy

## 2015-03-20 DIAGNOSIS — M25672 Stiffness of left ankle, not elsewhere classified: Secondary | ICD-10-CM

## 2015-03-20 DIAGNOSIS — R262 Difficulty in walking, not elsewhere classified: Secondary | ICD-10-CM | POA: Diagnosis not present

## 2015-03-20 DIAGNOSIS — M25511 Pain in right shoulder: Secondary | ICD-10-CM | POA: Diagnosis not present

## 2015-03-20 DIAGNOSIS — M25611 Stiffness of right shoulder, not elsewhere classified: Secondary | ICD-10-CM

## 2015-03-20 NOTE — Therapy (Signed)
[place], [place], [address] Phone: [phone]   Fax:  [phone]  Physical Therapy Treatment  Patient Details  Name: [name] MRN: [id number] Date of Birth: [birth year] Referring Provider:  Leandrew Koyanagi, MD  Encounter Date: 03/20/2015      PT End of Session - 03/20/15 1104    Visit Number 14   Number of Visits 24   Date for PT Re-Evaluation 03/29/15   PT Start Time 9476   PT Stop Time 1100   PT Time Calculation (min) 45 min   Activity Tolerance Patient tolerated treatment well   Behavior During Therapy [facility] for tasks assessed/performed      Past Medical History  Diagnosis Date  . Depression   . Shortness of breath     on exertion  . Sleep apnea     mild per pt- no CPAP  . Headache(784.0)     migraines  . Anemia   . Asthma     no inhaler  . Menorrhagia with regular cycle 01/20/2013  . S/P endometrial ablation 01/20/2013  . S/P laparoscopic assisted vaginal hysterectomy (LAVH) 01/21/2013  . Preoperative clearance   . Hyperlipidemia     Past Surgical History  Procedure Laterality Date  . Tubal ligation    . Breast lumpectomy      RT side  . Laparoscopic assisted vaginal hysterectomy N/A 01/21/2013    Procedure: LAPAROSCOPIC ASSISTED VAGINAL HYSTERECTOMY;  Surgeon: Thornell Sartorius, MD;  Location: [place] [facility];  Service: Gynecology;  Laterality: N/A;  . Bilateral salpingectomy N/A 01/21/2013    Procedure: BILATERAL SALPINGECTOMY;  Surgeon: Thornell Sartorius, MD;  Location: [place] [facility];  Service: Gynecology;  Laterality: N/A;  . Abdominal hysterectomy    . Thyroid lobectomy Left 10/01/2013    Procedure: THYROID LOBECTOMY;  Surgeon: Madilyn Hook, DO;  Location: [facility];  Service: General;  Laterality: Left;  . I&d extremity Left 10/14/2014    Procedure: IRRIGATION AND DEBRIDEMENT OPEN TIBIA FRACTURE LEFT LEG;  Surgeon: Marianna Payment, MD;  Location: [place];  Service: Orthopedics;  Laterality: Left;  .  External fixation leg Left 10/14/2014    Procedure: EXTERNAL FIXATION LEFT ANKLE;  Surgeon: Marianna Payment, MD;  Location: [place];  Service: Orthopedics;  Laterality: Left;  . External fixation removal Left 10/19/2014    Procedure: REMOVAL EXTERNAL FIXATION LEG;  Surgeon: Newt Minion, MD;  Location: [place];  Service: Orthopedics;  Laterality: Left;  . Ankle fusion Left 10/19/2014    Procedure: Tibiocalcaneal Fusion;  Surgeon: Newt Minion, MD;  Location: [place];  Service: Orthopedics;  Laterality: Left;    There were no vitals filed for this visit.  Visit Diagnosis:  Stiffness of joint, ankle and foot, left  Stiffness of joint, shoulder region, right  Shoulder joint pain, right  Difficulty in walking involving ankle and foot joint      Subjective Assessment - 03/20/15 1017    Subjective Just a little pain, sore inside of L ankle from last week.  Increased pain from mobilizations.  Gave sneaker to [place] for metal AFO, should be done in a week or so.    Currently in Pain? Yes   Pain Score 2    Pain Location Ankle   Pain Orientation Left;Medial       Pilates Reformer used for LE/core strength, postural strength, lumbopelvic disassociation and core control.  Exercises included:  Footwork 2 Red 1 Blue parallel, heels, toes and heel raises  Bridging all  springs x 10  Supine Arms:  1 Red pt unable to old table top due to lumbar straining, added yellow spring but patient loses neutral in L spine.   Feet in Straps 1 Red  Side leg press with jumpboard 1 red 1 Blue max cues for pelvis stable, neutral x 20 and hip ER x 20  Clam without springs to  20 to reinforce principles          PT Education - 03/20/15 1103    Education provided Yes   Education Details POC and DC plans, gym   Person(s) Educated Patient   Methods Explanation   Comprehension Verbalized understanding;Need further instruction          PT Short Term Goals - 03/17/15 1100    PT SHORT TERM GOAL #1   Title  Pt will be I with initial HEP for Rt. UE and LE weightbearing program, ROM and use of RICE   Status Achieved   PT SHORT TERM GOAL #2   Title Pt will be able to walk with WBAT and boot as needed in and out of the home with mod pain overall.    Status Achieved   PT SHORT TERM GOAL #3   Title Pt. will go up and down steps to access home with 1 rail, mod I and WB on LLE   Status Achieved   PT SHORT TERM GOAL #4   Title Pt. will be able to use Rt. UE to groom, ADLs and report only min pain.    Status Achieved           PT Long Term Goals - 03/17/15 1058    PT LONG TERM GOAL #1   Title Pt. will be I with advanced HEP for Rt. UE and Lt LE   Status On-going   PT LONG TERM GOAL #2   Title Pt. will be able to walk without boot and LRAD with min pain overall for 300 feet   Status On-going   PT LONG TERM GOAL #3   Title Pt. will DC use of wheelchair for mobility.    Status Partially Met   PT LONG TERM GOAL #4   Title Pt. will be able to reach overhead with min occ pain in Rt. UE   Status Partially Met   PT LONG TERM GOAL #5   Title Pt will demo 4+/5 strength in Rt. UE to improve lifting, carrying objects.    Status On-going   PT LONG TERM GOAL #6   Title Pt will improve PF strength to 4/5 within available range to improve ankle joint support   Status On-going   PT LONG TERM GOAL #7   Title Pt will iniate weight bearing without PT cueing    Status Partially Met               Plan - 03/20/15 1104    Clinical Impression Statement Patient requires maximum cueing for using Pilates Reformer.  Has difficulty with carry over of concepts.   Reports min strain of low back with holding "table top" legs.  Interested in getting "back in shape" at gym and has questions regarding what she can do.    PT Next Visit Plan try "gym " equipment next to ease transition from PT    PT Home Exercise Plan as previous, encouraged her to try maintaining table top for low abdominal strength    Consulted  and Agree with Plan of Care Patient  Problem List Patient Active Problem List   Diagnosis Date Noted  . Talus fracture 10/14/2014  . Preoperative clearance 04/12/2014  . Wound, surgical, infected 10/18/2013  . S/P laparoscopic assisted vaginal hysterectomy (LAVH) 01/21/2013  . Menorrhagia with regular cycle 01/20/2013  . S/P endometrial ablation 01/20/2013  . OBSTRUCTIVE SLEEP APNEA 07/20/2009  . ALLERGIC RHINITIS 07/20/2009  . ASTHMA 07/20/2009  . DYSPNEA 07/20/2009    PAA,JENNIFER 03/20/2015, 11:07 AM  [facility] [address] [place], [place], [address] Phone: [phone]   Fax:  [phone]

## 2015-03-22 ENCOUNTER — Ambulatory Visit: Payer: Medicare Other | Admitting: Physical Therapy

## 2015-03-25 DIAGNOSIS — S92109A Unspecified fracture of unspecified talus, initial encounter for closed fracture: Secondary | ICD-10-CM | POA: Diagnosis not present

## 2015-03-27 ENCOUNTER — Ambulatory Visit: Payer: Medicare Other | Admitting: Physical Therapy

## 2015-03-27 DIAGNOSIS — M25511 Pain in right shoulder: Secondary | ICD-10-CM | POA: Diagnosis not present

## 2015-03-27 DIAGNOSIS — M25672 Stiffness of left ankle, not elsewhere classified: Secondary | ICD-10-CM | POA: Diagnosis not present

## 2015-03-27 DIAGNOSIS — R262 Difficulty in walking, not elsewhere classified: Secondary | ICD-10-CM | POA: Diagnosis not present

## 2015-03-27 DIAGNOSIS — M25611 Stiffness of right shoulder, not elsewhere classified: Secondary | ICD-10-CM

## 2015-03-27 NOTE — Therapy (Signed)
[place], [place], [address] Phone: [phone]   Fax:  [phone]  Physical Therapy Treatment/Renewal  Patient Details  Name: [name] MRN: [id number] Date of Birth: 13-Nov-1975 Referring Provider:  Leandrew Koyanagi, MD  Encounter Date: 03/27/2015      PT End of Session - 03/27/15 1029    Visit Number 15   Number of Visits 24   Date for PT Re-Evaluation 04/12/15  POC extended 2 weeks to check AFO   PT Start Time 1000   PT Stop Time 1050   PT Time Calculation (min) 50 min   Activity Tolerance Patient tolerated treatment well      Past Medical History  Diagnosis Date  . Depression   . Shortness of breath     on exertion  . Sleep apnea     mild per pt- no CPAP  . Headache(784.0)     migraines  . Anemia   . Asthma     no inhaler  . Menorrhagia with regular cycle 01/20/2013  . S/P endometrial ablation 01/20/2013  . S/P laparoscopic assisted vaginal hysterectomy (LAVH) 01/21/2013  . Preoperative clearance   . Hyperlipidemia     Past Surgical History  Procedure Laterality Date  . Tubal ligation    . Breast lumpectomy      RT side  . Laparoscopic assisted vaginal hysterectomy N/A 01/21/2013    Procedure: LAPAROSCOPIC ASSISTED VAGINAL HYSTERECTOMY;  Surgeon: Thornell Sartorius, MD;  Location: [place] [facility];  Service: Gynecology;  Laterality: N/A;  . Bilateral salpingectomy N/A 01/21/2013    Procedure: BILATERAL SALPINGECTOMY;  Surgeon: Thornell Sartorius, MD;  Location: [place] [facility];  Service: Gynecology;  Laterality: N/A;  . Abdominal hysterectomy    . Thyroid lobectomy Left 10/01/2013    Procedure: THYROID LOBECTOMY;  Surgeon: Madilyn Hook, DO;  Location: [facility];  Service: General;  Laterality: Left;  . I&d extremity Left 10/14/2014    Procedure: IRRIGATION AND DEBRIDEMENT OPEN TIBIA FRACTURE LEFT LEG;  Surgeon: Marianna Payment, MD;  Location: [place];  Service: Orthopedics;  Laterality: Left;  . External  fixation leg Left 10/14/2014    Procedure: EXTERNAL FIXATION LEFT ANKLE;  Surgeon: Marianna Payment, MD;  Location: [place];  Service: Orthopedics;  Laterality: Left;  . External fixation removal Left 10/19/2014    Procedure: REMOVAL EXTERNAL FIXATION LEG;  Surgeon: Newt Minion, MD;  Location: [place];  Service: Orthopedics;  Laterality: Left;  . Ankle fusion Left 10/19/2014    Procedure: Tibiocalcaneal Fusion;  Surgeon: Newt Minion, MD;  Location: [place];  Service: Orthopedics;  Laterality: Left;    There were no vitals filed for this visit.  Visit Diagnosis:  Stiffness of joint, ankle and foot, left  Stiffness of joint, shoulder region, right  Shoulder joint pain, right  Difficulty in walking involving ankle and foot joint      Subjective Assessment - 03/27/15 1004    Subjective A "little bit"of pain (7/10) lateral ankle. Pain increases with walking.              [facility] PT Assessment - 03/27/15 1006    Assessment   Medical Diagnosis L ankle fusion   AROM   Right Shoulder Flexion --  WNL   Right Shoulder ABduction --  WNL   Left Ankle Dorsiflexion 0   Left Ankle Plantar Flexion 0   Strength   Right Shoulder Flexion 4+/5   Right Shoulder ABduction 5/5   Right Hip ABduction 4/5  Left Hip ABduction 3+/5   Left Knee Flexion 5/5   Left Knee Extension 4+/5   Left Ankle Dorsiflexion 1/5   Left Ankle Plantar Flexion 2/5   Left Ankle Inversion 3+/5   Left Ankle Eversion 3/5             [facility] Adult PT Treatment/Exercise - 03/27/15 1022    Knee/Hip Exercises: Aerobic   Stationary Bike NuStep 6 min  level 7 UE and LE    Knee/Hip Exercises: Supine   Bridges Both;1 set;20 reps   Bridges Limitations with ball   Knee/Hip Exercises: Sidelying   Hip ABduction Strengthening;Left;Both;1 set;20 reps   Other Sidelying Knee Exercises supine table top leg hold for lower abs   Manual Therapy   Joint Mobilization L ankle Gr 3-4 supination/pronation, EV.INV,and DF/PF. Met glides  gr 2-3 dorsal and plantar   Passive ROM all planes including toe ext.    Knee/Hip Exercises: Machines for Strengthening   Cybex Knee Extension 1 plate x 10 x3 sets, used Rt. LE to assist   Cybex Knee Flexion 2 plates x 20 x 2   Cybex Leg Press 1 plate x 20 bilat., 1 plate x 10 Lt LE only x 2 sets     Self care: patient with multiple questions regarding ther ex she can do at the gym, pool, machines and abdominals      PT Education - 03/27/15 1027    Education provided Yes   Education Details gym exercises   Person(s) Educated Patient   Methods Explanation;Demonstration   Comprehension Verbalized understanding;Returned demonstration          PT Short Term Goals - 03/27/15 1024    PT SHORT TERM GOAL #1   Title Pt will be I with initial HEP for Rt. UE and LE weightbearing program, ROM and use of RICE   Status Achieved   PT SHORT TERM GOAL #2   Title Pt will be able to walk with WBAT and boot as needed in and out of the home with mod pain overall.    Status Achieved   PT SHORT TERM GOAL #3   Title Pt. will go up and down steps to access home with 1 rail, mod I and WB on LLE   Status Achieved   PT SHORT TERM GOAL #4   Title Pt. will be able to use Rt. UE to groom, ADLs and report only min pain.    Status Achieved           PT Long Term Goals - 03/27/15 1024    PT LONG TERM GOAL #1   Title Pt. will be I with advanced HEP for Rt. UE and Lt LE   Status On-going   PT LONG TERM GOAL #2   Title Pt. will be able to walk without boot and LRAD with min pain overall for 300 feet   Status On-going   PT LONG TERM GOAL #3   Title Pt. will DC use of wheelchair for mobility.    Baseline --  pain is too much if she is up walking >20 min    Status Partially Met   PT LONG TERM GOAL #4   Title Pt. will be able to reach overhead with min occ pain in Rt. UE   Status Achieved   PT LONG TERM GOAL #5   Title Pt will demo 4+/5 strength in Rt. UE to improve lifting, carrying objects.     Status Achieved   PT LONG TERM  GOAL #6   Title Pt will improve PF strength to 4/5 within available range to improve ankle joint support               Plan - 03/27/15 1056    Clinical Impression Statement Patient lacks strength in hips, lower abdominals.  No visbile ankle AROM but did not measure INV/EV.  Patient will be extended in POC to assess gait with AFO (getting Friday) and assist with coordination of care with Prosthetist.     PT Next Visit Plan leg press, knee machine, cont hip strength and check INV/EV ROM.    PT Home Exercise Plan gave her current weight amts used today.   Consulted and Agree with Plan of Care Patient        Problem List Patient Active Problem List   Diagnosis Date Noted  . Talus fracture 10/14/2014  . Preoperative clearance 04/12/2014  . Wound, surgical, infected 10/18/2013  . S/P laparoscopic assisted vaginal hysterectomy (LAVH) 01/21/2013  . Menorrhagia with regular cycle 01/20/2013  . S/P endometrial ablation 01/20/2013  . OBSTRUCTIVE SLEEP APNEA 07/20/2009  . ALLERGIC RHINITIS 07/20/2009  . ASTHMA 07/20/2009  . DYSPNEA 07/20/2009    PAA,JENNIFER 03/27/2015, 11:00 AM  [facility] [address] [place], [place], [address] Phone: [phone]   Fax:  [phone]    Raeford Razor, PT 03/27/2015 11:00 AM Phone: [phone] Fax: [phone]

## 2015-03-29 ENCOUNTER — Ambulatory Visit: Payer: Medicare Other | Admitting: Physical Therapy

## 2015-03-30 DIAGNOSIS — M19072 Primary osteoarthritis, left ankle and foot: Secondary | ICD-10-CM | POA: Diagnosis not present

## 2015-04-04 ENCOUNTER — Encounter: Payer: Medicare Other | Admitting: Physical Therapy

## 2015-04-06 ENCOUNTER — Ambulatory Visit: Payer: Medicare Other | Admitting: Physical Therapy

## 2015-04-06 DIAGNOSIS — M25611 Stiffness of right shoulder, not elsewhere classified: Secondary | ICD-10-CM | POA: Diagnosis not present

## 2015-04-06 DIAGNOSIS — M25672 Stiffness of left ankle, not elsewhere classified: Secondary | ICD-10-CM | POA: Diagnosis not present

## 2015-04-06 DIAGNOSIS — M25511 Pain in right shoulder: Secondary | ICD-10-CM | POA: Diagnosis not present

## 2015-04-06 DIAGNOSIS — R262 Difficulty in walking, not elsewhere classified: Secondary | ICD-10-CM

## 2015-04-06 NOTE — Therapy (Signed)
[facility] [address] [place], [place], [address] Phone: [phone]   Fax:  [phone]  Physical Therapy Treatment  Patient Details  Name: [name] MRN: [id number] Date of Birth: Oct 11, 1976 Referring Provider:  Nadara Mustard, MD  Encounter Date: 04/06/2015      PT End of Session - 04/06/15 1052    Visit Number 16   Number of Visits 24   Date for PT Re-Evaluation 04/12/15   PT Start Time 1014   PT Stop Time 1125   PT Time Calculation (min) 71 min   Equipment Utilized During Treatment Gait belt   Activity Tolerance Patient tolerated treatment well   Behavior During Therapy [place] for tasks assessed/performed      Past Medical History  Diagnosis Date  . Depression   . Shortness of breath     on exertion  . Sleep apnea     mild per pt- no CPAP  . Headache(784.0)     migraines  . Anemia   . Asthma     no inhaler  . Menorrhagia with regular cycle 01/20/2013  . S/P endometrial ablation 01/20/2013  . S/P laparoscopic assisted vaginal hysterectomy (LAVH) 01/21/2013  . Preoperative clearance   . Hyperlipidemia     Past Surgical History  Procedure Laterality Date  . Tubal ligation    . Breast lumpectomy      RT side  . Laparoscopic assisted vaginal hysterectomy N/A 01/21/2013    Procedure: LAPAROSCOPIC ASSISTED VAGINAL HYSTERECTOMY;  Surgeon: Sherron Monday, MD;  Location: [facility];  Service: Gynecology;  Laterality: N/A;  . Bilateral salpingectomy N/A 01/21/2013    Procedure: BILATERAL SALPINGECTOMY;  Surgeon: Sherron Monday, MD;  Location: [facility];  Service: Gynecology;  Laterality: N/A;  . Abdominal hysterectomy    . Thyroid lobectomy Left 10/01/2013    Procedure: THYROID LOBECTOMY;  Surgeon: Lodema Pilot, DO;  Location: [facility];  Service: General;  Laterality: Left;  . I&d extremity Left 10/14/2014    Procedure: IRRIGATION AND DEBRIDEMENT OPEN TIBIA FRACTURE LEFT LEG;  Surgeon: Cheral Almas, MD;  Location: [facility];   Service: Orthopedics;  Laterality: Left;  . External fixation leg Left 10/14/2014    Procedure: EXTERNAL FIXATION LEFT ANKLE;  Surgeon: Cheral Almas, MD;  Location: [facility];  Service: Orthopedics;  Laterality: Left;  . External fixation removal Left 10/19/2014    Procedure: REMOVAL EXTERNAL FIXATION LEG;  Surgeon: Nadara Mustard, MD;  Location: [facility];  Service: Orthopedics;  Laterality: Left;  . Ankle fusion Left 10/19/2014    Procedure: Tibiocalcaneal Fusion;  Surgeon: Nadara Mustard, MD;  Location: [facility];  Service: Orthopedics;  Laterality: Left;    There were no vitals filed for this visit.  Visit Diagnosis:  Stiffness of joint, ankle and foot, left  Stiffness of joint, shoulder region, right  Shoulder joint pain, right  Difficulty in walking involving ankle and foot joint      Subjective Assessment - 04/06/15 1014    Subjective Got my AFO, pain in heel still, brace is too tight, wears it 2-3 hours per day.  Wants Chris from [place] to meet her here for her last appt. to loosen the brace, make more comfortably.    Currently in Pain? Yes   Pain Score 6    Pain Location Ankle   Pain Orientation Left   Pain Descriptors / Indicators Aching            [facility] PT Assessment - 04/06/15 1109  Cognition   Overall Cognitive Status Within Functional Limits for tasks assessed   Problem Solving Impaired   Problem Solving Impairment Functional basic   Executive Function Reasoning;Decision Making   Reasoning Impaired   Reasoning Impairment Verbal basic   Decision Making Impaired   Decision Making Impairment Functional basic   Ambulation/Gait   Ambulation/Gait Yes   Ambulation/Gait Assistance 6: Modified independent (Device/Increase time)   Ambulation/Gait Assistance Details can correct knee flexion with constant cueing   Ambulation Distance (Feet) 300 Feet   Assistive device Straight cane   Gait Pattern Step-to pattern;Decreased stance time - left;Decreased hip/knee flexion -  left;Left circumduction;Left hip hike;Left genu recurvatum   Stairs Yes   Stairs Assistance 6: Modified independent (Device/Increase time)   Stairs Assistance Details (indicate cue type and reason) verbal cues for safety, techinque   Stair Management Technique One rail Right;Step to pattern;Sideways   Number of Stairs 12   Height of Stairs 4  6   Ramp 5: Supervision   Ramp Details (indicate cue type and reason) cement, cues for safety            [facility] Adult PT Treatment/Exercise - 04/06/15 1109    Cryotherapy   Number Minutes Cryotherapy 15 Minutes   Cryotherapy Location Ankle   Type of Cryotherapy Other (comment)  vaso            PT Education - 04/06/15 1051    Education provided Yes   Education Details encouragement for mobility, ability to do normal activities   Person(s) Educated Patient   Methods Explanation;Demonstration   Comprehension Verbalized understanding;Returned demonstration          PT Short Term Goals - 04/06/15 1057    PT SHORT TERM GOAL #1   Title Pt will be I with initial HEP for Rt. UE and LE weightbearing program, ROM and use of RICE   Status Achieved   PT SHORT TERM GOAL #2   Title Pt will be able to walk with WBAT and boot as needed in and out of the home with mod pain overall.    Status Achieved   PT SHORT TERM GOAL #3   Title Pt. will go up and down steps to access home with 1 rail, mod I and WB on LLE   Status Achieved   PT SHORT TERM GOAL #4   Title Pt. will be able to use Rt. UE to groom, ADLs and report only min pain.    Status Achieved           PT Long Term Goals - 04/06/15 1052    PT LONG TERM GOAL #1   Title Pt. will be I with advanced HEP for Rt. UE and Lt LE   Status On-going   PT LONG TERM GOAL #2   Title Pt. will be able to walk without boot and LRAD with min pain overall for 300 feet   Baseline 6/10 with activity, walking   Status On-going   PT LONG TERM GOAL #3   Title Pt. will DC use of wheelchair for  mobility.    Baseline uses at home    Status Partially Met   PT LONG TERM GOAL #4   Title Pt. will be able to reach overhead with min occ pain in Rt. UE   Status Achieved   PT LONG TERM GOAL #5   Title Pt will demo 4+/5 strength in Rt. UE to improve lifting, carrying objects.    Status Achieved   PT  LONG TERM GOAL #6   Title Pt will improve PF strength to 4/5 within available range to improve ankle joint support   Status On-going   PT LONG TERM GOAL #7   Title Pt will iniate weight bearing without PT cueing    Status Achieved               Plan - 04/06/15 1436    Clinical Impression Statement Patient presented with AFO (double upright) on LLE today, lacks confidence and knee control, pelvic stability.  She needs reminders for safety with cane, did better with a RW today, even outside walking down a steep ramp.  She is having difficulty accepting her condition as the new normal, declined referral for counselor (has one).  She lacks insight into her abilities, expressing she can't do housework (standing in kitchen), yet asks about doing "dance therapy". Patient offered reassurance that ankle is stable with her brace and offered support in dealing with her loss of LE function.  She will make appt with [facility] for AFO adjustment.     PT Next Visit Plan Finalize HEP, challenge gait/balance and FOTO/GCODE   PT Home Exercise Plan has full HEP   Consulted and Agree with Plan of Care Patient        Problem List Patient Active Problem List   Diagnosis Date Noted  . Talus fracture 10/14/2014  . Preoperative clearance 04/12/2014  . Wound, surgical, infected 10/18/2013  . S/P laparoscopic assisted vaginal hysterectomy (LAVH) 01/21/2013  . Menorrhagia with regular cycle 01/20/2013  . S/P endometrial ablation 01/20/2013  . OBSTRUCTIVE SLEEP APNEA 07/20/2009  . ALLERGIC RHINITIS 07/20/2009  . ASTHMA 07/20/2009  . DYSPNEA 07/20/2009    Cecil Bixby 04/06/2015, 2:44 PM  [facility] [address] [place], [place], [address] Phone: [phone]   Fax:  [phone]    Raeford Razor, PT 04/06/2015 2:45 PM Phone: [phone] Fax: [phone]

## 2015-04-12 ENCOUNTER — Ambulatory Visit: Payer: Medicare Other | Attending: Orthopedic Surgery | Admitting: Physical Therapy

## 2015-04-12 DIAGNOSIS — M25511 Pain in right shoulder: Secondary | ICD-10-CM | POA: Diagnosis not present

## 2015-04-12 DIAGNOSIS — R262 Difficulty in walking, not elsewhere classified: Secondary | ICD-10-CM | POA: Diagnosis not present

## 2015-04-12 DIAGNOSIS — M25611 Stiffness of right shoulder, not elsewhere classified: Secondary | ICD-10-CM

## 2015-04-12 DIAGNOSIS — M25672 Stiffness of left ankle, not elsewhere classified: Secondary | ICD-10-CM | POA: Insufficient documentation

## 2015-04-12 NOTE — Therapy (Signed)
[facility] [address] [place], [place], [address] Phone: [phone]   Fax:  [phone]  Physical Therapy Treatment  Patient Details  Name: [name] MRN: [id number] Date of Birth: 12/07/1975 Referring Provider:  Doolittle, Robert P, MD  Encounter Date: 04/12/2015      PT End of Session - 04/12/15 1101    Visit Number 17   Number of Visits 24   Date for PT Re-Evaluation 04/12/15   PT Start Time 1018   PT Stop Time 1058   PT Time Calculation (min) 40 min      Past Medical History  Diagnosis Date  . Depression   . Shortness of breath     on exertion  . Sleep apnea     mild per pt- no CPAP  . Headache(784.0)     migraines  . Anemia   . Asthma     no inhaler  . Menorrhagia with regular cycle 01/20/2013  . S/P endometrial ablation 01/20/2013  . S/P laparoscopic assisted vaginal hysterectomy (LAVH) 01/21/2013  . Preoperative clearance   . Hyperlipidemia     Past Surgical History  Procedure Laterality Date  . Tubal ligation    . Breast lumpectomy      RT side  . Laparoscopic assisted vaginal hysterectomy N/A 01/21/2013    Procedure: LAPAROSCOPIC ASSISTED VAGINAL HYSTERECTOMY;  Surgeon: [name] [name], MD;  Location: [facility];  Service: Gynecology;  Laterality: N/A;  . Bilateral salpingectomy N/A 01/21/2013    Procedure: BILATERAL SALPINGECTOMY;  Surgeon: [name] [name], MD;  Location: [facility];  Service: Gynecology;  Laterality: N/A;  . Abdominal hysterectomy    . Thyroid lobectomy Left 10/01/2013    Procedure: THYROID LOBECTOMY;  Surgeon: Brian Layton, DO;  Location: [facility];  Service: General;  Laterality: Left;  . I&d extremity Left 10/14/2014    Procedure: IRRIGATION AND DEBRIDEMENT OPEN TIBIA FRACTURE LEFT LEG;  Surgeon: Naiping Michael Xu, MD;  Location: [facility];  Service: Orthopedics;  Laterality: Left;  . External fixation leg Left 10/14/2014    Procedure: EXTERNAL FIXATION LEFT ANKLE;  Surgeon: Naiping Michael Xu, MD;   Location: [facility];  Service: Orthopedics;  Laterality: Left;  . External fixation removal Left 10/19/2014    Procedure: REMOVAL EXTERNAL FIXATION LEG;  Surgeon: Marcus Duda V, MD;  Location: [facility];  Service: Orthopedics;  Laterality: Left;  . Ankle fusion Left 10/19/2014    Procedure: Tibiocalcaneal Fusion;  Surgeon: Marcus Duda V, MD;  Location: [facility];  Service: Orthopedics;  Laterality: Left;    There were no vitals filed for this visit.  Visit Diagnosis:  No diagnosis found.          [facility] PT Assessment - 04/12/15 1107    Observation/Other Assessments   Other Surveys  Other Surveys   Lower Extremity Functional Scale  52% limited                     [facility] Adult PT Treatment/Exercise - 04/12/15 1022    Knee/Hip Exercises: Aerobic   Stationary Bike Rec bike Level 2 x 17 minutes   Knee/Hip Exercises: Machines for Strengthening   Cybex Knee Extension 1 plate x 10 x3 sets, used Rt. LE to assist   Cybex Knee Flexion 3 plates x 15, 2 plates x 15   Cybex Leg Press 1 plate 4 x 10   Knee/Hip Exercises: Supine   Bridges Both;1 set;20 reps   Bridges Limitations with ball   Other Supine Knee Exercises   hamstring curls with bridge on bal x 10, rolling ball r to left ball under knees, ball under ankles x 10 each    Other Supine Knee Exercises crunches x 10, scissors level 3 all with cues for neutral spine                   PT Short Term Goals - 04/06/15 1057    PT SHORT TERM GOAL #1   Title Pt will be I with initial HEP for Rt. UE and LE weightbearing program, ROM and use of RICE   Status Achieved   PT SHORT TERM GOAL #2   Title Pt will be able to walk with WBAT and boot as needed in and out of the home with mod pain overall.    Status Achieved   PT SHORT TERM GOAL #3   Title Pt. will go up and down steps to access home with 1 rail, mod I and WB on LLE   Status Achieved   PT SHORT TERM GOAL #4   Title Pt. will be able to use Rt. UE to groom, ADLs and report only  min pain.    Status Achieved           PT Long Term Goals - 04/06/15 1052    PT LONG TERM GOAL #1   Title Pt. will be I with advanced HEP for Rt. UE and Lt LE   Status On-going   PT LONG TERM GOAL #2   Title Pt. will be able to walk without boot and LRAD with min pain overall for 300 feet   Baseline 6/10 with activity, walking   Status On-going   PT LONG TERM GOAL #3   Title Pt. will DC use of wheelchair for mobility.    Baseline uses at home    Status Partially Met   PT LONG TERM GOAL #4   Title Pt. will be able to reach overhead with min occ pain in Rt. UE   Status Achieved   PT LONG TERM GOAL #5   Title Pt will demo 4+/5 strength in Rt. UE to improve lifting, carrying objects.    Status Achieved   PT LONG TERM GOAL #6   Title Pt will improve PF strength to 4/5 within available range to improve ankle joint support   Status On-going   PT LONG TERM GOAL #7   Title Pt will iniate weight bearing without PT cueing    Status Achieved               Plan - 04/12/15 1116    Clinical Impression Statement Pt presents today with request to review exercise machines and to perform core exercises on ball and sit ups. Instructed pt in safe neutral spine abdominal strengthening. Pt is interested in mat yoga. She does not think pilates is right for her. Educated pt on benefits of pilates for core and flexibility. Instructed pt in use of gym equipment and using light weighs and increasing reps not weights. Pt has an appt with orthotics company today after her treatment to adjust her AFO. She would like it loosened.    PT Next Visit Plan discharge today.         Problem List Patient Active Problem List   Diagnosis Date Noted  . Talus fracture 10/14/2014  . Preoperative clearance 04/12/2014  . Wound, surgical, infected 10/18/2013  . S/P laparoscopic assisted vaginal hysterectomy (LAVH) 01/21/2013  . Menorrhagia with regular cycle 01/20/2013  . S/P endometrial  ablation  01/20/2013  . OBSTRUCTIVE SLEEP APNEA 07/20/2009  . ALLERGIC RHINITIS 07/20/2009  . ASTHMA 07/20/2009  . DYSPNEA 07/20/2009    ,  [place], PTA 04/12/2015, 11:24 AM  [facility] [address] [place], [place], [address] Phone: [phone]   Fax:  [phone]   PHYSICAL THERAPY DISCHARGE SUMMARY  Visits from Start of Care: 17  Current functional level related to goals / functional outcomes: See above for goals met  Remaining deficits: Strength in LE, ankle ROM, standing tolerance, gait stability/balance   Education / Equipment: AFO, HEP, gym exercises, pilates/yoga. Pt was given encouragement and support in dealing with her condition.  She is struggling to accept that she can still do many things to improve her quality of life.  I recommended she speak with a counselor and she said she had one in place.  She is limited in resources including financial and transportation and this is understandably frustrating for her.  I encouraged her to reach out to family, friends and counselor for support.  Plan: Patient agrees to discharge.  Patient goals were met. Patient is being discharged due to meeting the stated rehab goals.  ?????     Patient has plateaued with progress and will not gain further measurable progress in a reasonable timeframe.  Jennifer Paa, PT 04/13/2015 10:21 AM Phone: [phone] Fax: [phone]  

## 2015-04-13 DIAGNOSIS — M25611 Stiffness of right shoulder, not elsewhere classified: Secondary | ICD-10-CM | POA: Diagnosis not present

## 2015-04-13 DIAGNOSIS — M25672 Stiffness of left ankle, not elsewhere classified: Secondary | ICD-10-CM | POA: Diagnosis not present

## 2015-04-13 DIAGNOSIS — M25511 Pain in right shoulder: Secondary | ICD-10-CM | POA: Diagnosis not present

## 2015-04-13 DIAGNOSIS — R262 Difficulty in walking, not elsewhere classified: Secondary | ICD-10-CM | POA: Diagnosis not present

## 2015-04-14 ENCOUNTER — Encounter: Payer: Medicare Other | Admitting: Physical Therapy

## 2015-04-17 ENCOUNTER — Encounter: Payer: Medicare Other | Admitting: Physical Therapy

## 2015-04-19 ENCOUNTER — Encounter: Payer: Medicare Other | Admitting: Physical Therapy

## 2015-04-25 DIAGNOSIS — S92109A Unspecified fracture of unspecified talus, initial encounter for closed fracture: Secondary | ICD-10-CM | POA: Diagnosis not present

## 2015-05-05 ENCOUNTER — Other Ambulatory Visit: Payer: Self-pay | Admitting: Family Medicine

## 2015-05-05 ENCOUNTER — Other Ambulatory Visit (HOSPITAL_COMMUNITY)
Admission: RE | Admit: 2015-05-05 | Discharge: 2015-05-05 | Disposition: A | Discharge status: Home / Self Care | Payer: Medicare Other | Source: Ambulatory Visit | Attending: Family Medicine | Admitting: Family Medicine

## 2015-05-05 DIAGNOSIS — N760 Acute vaginitis: Secondary | ICD-10-CM | POA: Diagnosis not present

## 2015-05-05 DIAGNOSIS — K7290 Hepatic failure, unspecified without coma: Secondary | ICD-10-CM | POA: Diagnosis not present

## 2015-05-05 DIAGNOSIS — F4312 Post-traumatic stress disorder, chronic: Secondary | ICD-10-CM | POA: Diagnosis not present

## 2015-05-05 DIAGNOSIS — A64 Unspecified sexually transmitted disease: Secondary | ICD-10-CM | POA: Diagnosis not present

## 2015-05-05 DIAGNOSIS — Z113 Encounter for screening for infections with a predominantly sexual mode of transmission: Secondary | ICD-10-CM | POA: Insufficient documentation

## 2015-05-09 DIAGNOSIS — M25572 Pain in left ankle and joints of left foot: Secondary | ICD-10-CM | POA: Diagnosis not present

## 2015-05-10 LAB — CERVICOVAGINAL ANCILLARY ONLY
Bacterial vaginitis: NEGATIVE
CHLAMYDIA, DNA PROBE: NEGATIVE
Candida vaginitis: NEGATIVE
Neisseria Gonorrhea: NEGATIVE
TRICH (WINDOWPATH): NEGATIVE

## 2015-05-25 DIAGNOSIS — S92109A Unspecified fracture of unspecified talus, initial encounter for closed fracture: Secondary | ICD-10-CM | POA: Diagnosis not present

## 2015-06-25 DIAGNOSIS — S92109A Unspecified fracture of unspecified talus, initial encounter for closed fracture: Secondary | ICD-10-CM | POA: Diagnosis not present

## 2015-07-25 DIAGNOSIS — M25572 Pain in left ankle and joints of left foot: Secondary | ICD-10-CM | POA: Diagnosis not present

## 2015-07-26 DIAGNOSIS — S92109A Unspecified fracture of unspecified talus, initial encounter for closed fracture: Secondary | ICD-10-CM | POA: Diagnosis not present

## 2015-09-27 ENCOUNTER — Encounter (HOSPITAL_COMMUNITY): Payer: Self-pay | Admitting: Emergency Medicine

## 2015-09-27 ENCOUNTER — Emergency Department (HOSPITAL_COMMUNITY)
Admission: EM | Admit: 2015-09-27 | Discharge: 2015-09-27 | Disposition: A | Discharge status: Home / Self Care | Payer: Medicare Other | Attending: Emergency Medicine | Admitting: Emergency Medicine

## 2015-09-27 ENCOUNTER — Emergency Department (HOSPITAL_COMMUNITY): Payer: Medicare Other

## 2015-09-27 DIAGNOSIS — Y998 Other external cause status: Secondary | ICD-10-CM | POA: Diagnosis not present

## 2015-09-27 DIAGNOSIS — J45909 Unspecified asthma, uncomplicated: Secondary | ICD-10-CM | POA: Insufficient documentation

## 2015-09-27 DIAGNOSIS — Y9389 Activity, other specified: Secondary | ICD-10-CM | POA: Diagnosis not present

## 2015-09-27 DIAGNOSIS — Z8742 Personal history of other diseases of the female genital tract: Secondary | ICD-10-CM | POA: Insufficient documentation

## 2015-09-27 DIAGNOSIS — Z8679 Personal history of other diseases of the circulatory system: Secondary | ICD-10-CM | POA: Diagnosis not present

## 2015-09-27 DIAGNOSIS — Y9289 Other specified places as the place of occurrence of the external cause: Secondary | ICD-10-CM | POA: Diagnosis not present

## 2015-09-27 DIAGNOSIS — S4991XA Unspecified injury of right shoulder and upper arm, initial encounter: Secondary | ICD-10-CM | POA: Insufficient documentation

## 2015-09-27 DIAGNOSIS — Z8639 Personal history of other endocrine, nutritional and metabolic disease: Secondary | ICD-10-CM | POA: Diagnosis not present

## 2015-09-27 DIAGNOSIS — Z01818 Encounter for other preprocedural examination: Secondary | ICD-10-CM | POA: Diagnosis not present

## 2015-09-27 DIAGNOSIS — Z90710 Acquired absence of both cervix and uterus: Secondary | ICD-10-CM | POA: Insufficient documentation

## 2015-09-27 DIAGNOSIS — F17210 Nicotine dependence, cigarettes, uncomplicated: Secondary | ICD-10-CM | POA: Insufficient documentation

## 2015-09-27 DIAGNOSIS — W2209XA Striking against other stationary object, initial encounter: Secondary | ICD-10-CM | POA: Diagnosis not present

## 2015-09-27 DIAGNOSIS — Z862 Personal history of diseases of the blood and blood-forming organs and certain disorders involving the immune mechanism: Secondary | ICD-10-CM | POA: Insufficient documentation

## 2015-09-27 DIAGNOSIS — Z79899 Other long term (current) drug therapy: Secondary | ICD-10-CM | POA: Insufficient documentation

## 2015-09-27 DIAGNOSIS — M25511 Pain in right shoulder: Secondary | ICD-10-CM | POA: Diagnosis not present

## 2015-09-27 DIAGNOSIS — Z8669 Personal history of other diseases of the nervous system and sense organs: Secondary | ICD-10-CM | POA: Insufficient documentation

## 2015-09-27 DIAGNOSIS — F329 Major depressive disorder, single episode, unspecified: Secondary | ICD-10-CM | POA: Diagnosis not present

## 2015-09-27 MED ORDER — IBUPROFEN 800 MG PO TABS: 800.0000 mg | ORAL_TABLET | Freq: Three times a day (TID) | ORAL | Status: DC

## 2015-09-27 MED ORDER — IBUPROFEN 800 MG PO TABS
800.0000 mg | ORAL_TABLET | Freq: Once | ORAL | Status: AC
  Administered 2015-09-27: 800 mg via ORAL
  Filled 2015-09-27: qty 1

## 2015-09-27 NOTE — ED Notes (Signed)
Pt hit a wall with right arm today, felt a pop in her shoulder. Went to raise arm afterwards and felt another pop with some pain to right shoulder. States now is unable to lift fully. No obvious deformity in triage.

## 2015-09-27 NOTE — Discharge Instructions (Signed)

## 2015-09-27 NOTE — ED Provider Notes (Signed)
CSN: [id number]     Arrival date & time 09/27/15  1317 History  By signing my name below, I, [name], attest that this documentation has been prepared under the direction and in the presence of Gloriann Loan, PA-C. Electronically Signed: [name], ED Scribe. 09/27/2015. 1:49 PM.   Chief Complaint  Patient presents with  . Shoulder Injury   The history is provided by the patient. No language interpreter was used.    HPI Comments: [name] is a 39 y.o. female who presents to the Emergency Department complaining of constant, moderate, non-radiating, right shoulder pain with onset 1 week ago. She notes that she struck a wall with her right arm in a downward motion resulting in her pain which worsened beginning yesterday while disassembling a fan. She describes her pain as burning, rates it as 8/10 and further notes a worsening of her pain when raising her right arm above her shoulder. Pt notes some associated, mild, intermittent, tingling in her right hand. Pt notes taking 1x muscle relaxer yesterday and denies any relief. She denies a hx of shoulder injury. Pt denies a PMHx of DM. She denies joint swelling or numbness.    Past Medical History  Diagnosis Date  . Depression   . Shortness of breath     on exertion  . Sleep apnea     mild per pt- no CPAP  . Headache(784.0)     migraines  . Anemia   . Asthma     no inhaler  . Menorrhagia with regular cycle 01/20/2013  . S/P endometrial ablation 01/20/2013  . S/P laparoscopic assisted vaginal hysterectomy (LAVH) 01/21/2013  . Preoperative clearance   . Hyperlipidemia    Past Surgical History  Procedure Laterality Date  . Tubal ligation    . Breast lumpectomy      RT side  . Laparoscopic assisted vaginal hysterectomy N/A 01/21/2013    Procedure: LAPAROSCOPIC ASSISTED VAGINAL HYSTERECTOMY;  Surgeon: Thornell Sartorius, MD;  Location: [facility];  Service: Gynecology;  Laterality: N/A;  . Bilateral salpingectomy N/A 01/21/2013   Procedure: BILATERAL SALPINGECTOMY;  Surgeon: Thornell Sartorius, MD;  Location: [facility];  Service: Gynecology;  Laterality: N/A;  . Abdominal hysterectomy    . Thyroid lobectomy Left 10/01/2013    Procedure: THYROID LOBECTOMY;  Surgeon: Madilyn Hook, DO;  Location: [facility];  Service: General;  Laterality: Left;  . I&d extremity Left 10/14/2014    Procedure: IRRIGATION AND DEBRIDEMENT OPEN TIBIA FRACTURE LEFT LEG;  Surgeon: Marianna Payment, MD;  Location: [facility];  Service: Orthopedics;  Laterality: Left;  . External fixation leg Left 10/14/2014    Procedure: EXTERNAL FIXATION LEFT ANKLE;  Surgeon: Marianna Payment, MD;  Location: [facility];  Service: Orthopedics;  Laterality: Left;  . External fixation removal Left 10/19/2014    Procedure: REMOVAL EXTERNAL FIXATION LEG;  Surgeon: Newt Minion, MD;  Location: [facility];  Service: Orthopedics;  Laterality: Left;  . Ankle fusion Left 10/19/2014    Procedure: Tibiocalcaneal Fusion;  Surgeon: Newt Minion, MD;  Location: [facility];  Service: Orthopedics;  Laterality: Left;   Family History  Problem Relation Age of Onset  . Depression Father    Social History  Substance Use Topics  . Smoking status: Current Some Day Smoker -- 0.25 packs/day    Types: Cigarettes  . Smokeless tobacco: Never Used     Comment: 1 pack will last a week and a half  . Alcohol Use: No   OB History  No data available     Review of Systems  A complete 10 system review of systems was obtained and all systems are negative except as noted in the HPI and PMH.  Allergies  Shrimp  Home Medications   Prior to Admission medications   Medication Sig Start Date End Date Taking? Authorizing Provider  albuterol (PROVENTIL HFA;VENTOLIN HFA) 108 (90 BASE) MCG/ACT inhaler Inhale 2 puffs into the lungs every 6 (six) hours as needed for wheezing. 05/25/13   Leandrew Koyanagi, MD  beclomethasone (QVAR) 40 MCG/ACT inhaler Inhale 2 puffs into the lungs 2 (two) times daily. For 2 months, then may  discontinue if wheezing resolved 05/25/13   Leandrew Koyanagi, MD  BIOTIN PO Take 1 tablet by mouth daily.    Historical Provider, MD  DULoxetine (CYMBALTA) 20 MG capsule Take 20 mg by mouth 2 (two) times daily.    Historical Provider, MD  HYDROcodone-acetaminophen (HYCET) 7.5-325 mg/15 ml solution Take 10-15 mLs by mouth every 4 (four) hours as needed for moderate pain. 10/18/13   Judeth Horn, MD  hydrOXYzine (ATARAX/VISTARIL) 50 MG tablet Take 50 mg by mouth 3 (three) times daily as needed for anxiety.     Historical Provider, MD  ibuprofen (ADVIL,MOTRIN) 800 MG tablet Take 1 tablet (800 mg total) by mouth 3 (three) times daily. 09/27/15   Gloriann Loan, PA-C  methocarbamol (ROBAXIN) 500 MG tablet Take 1 tablet (500 mg total) by mouth 3 (three) times daily. 10/24/14   Newt Minion, MD  oxyCODONE-acetaminophen (ROXICET) 5-325 MG per tablet Take 1 tablet by mouth every 4 (four) hours as needed for severe pain. 10/24/14   Newt Minion, MD  traZODone (DESYREL) 100 MG tablet Take 100-200 mg by mouth at bedtime.     Historical Provider, MD   BP 136/78 mmHg  Pulse 108  Temp(Src) 98.1 F (36.7 C) (Oral)  Resp 18  SpO2 100%  LMP 01/07/2013 Physical Exam  Constitutional: She is oriented to person, place, and time. She appears well-developed and well-nourished.  HENT:  Head: Normocephalic and atraumatic.  Mouth/Throat: No oropharyngeal exudate.  Eyes: Conjunctivae are normal.  Neck: Normal range of motion. Neck supple. No tracheal deviation present.  Cardiovascular: Normal rate, regular rhythm and normal heart sounds.   Pulses:      Radial pulses are 2+ on the right side, and 2+ on the left side.  Capillary refill less than 3 seconds.   Pulmonary/Chest: Effort normal and breath sounds normal. No respiratory distress.  Abdominal: Soft. Bowel sounds are normal. She exhibits no distension. There is no tenderness.  Musculoskeletal: Normal range of motion.       Right shoulder: She exhibits tenderness  and pain. She exhibits normal range of motion, no bony tenderness, no swelling, no effusion, no deformity, normal pulse and normal strength.       Left shoulder: Normal. She exhibits normal range of motion, no bony tenderness, no swelling, no effusion, no crepitus, no deformity, no spasm, normal pulse and normal strength.       Right elbow: Normal.      Left elbow: Normal.       Right wrist: Normal.       Left wrist: Normal.       Arms: Negative Neers.  Rotator cuff appears intact.   Neurological: She is alert and oriented to person, place, and time.  Strength and sensation intact bilaterally in upper extremities.   Skin: Skin is warm and dry. She is not diaphoretic.  Psychiatric: She has a normal mood and affect. Her behavior is normal.  Nursing note and vitals reviewed.  ED Course  Procedures  DIAGNOSTIC STUDIES: Oxygen Saturation is 100% on RA, normal by my interpretation.    COORDINATION OF CARE: 1:48 PM Pt presents today due to right shoulder pain. Discussed next steps with pt at bedside including reevaluation based on imaging results, 1x motrin, an rx for motrin, referral to an orthopaedic specialist and a right shoulder sling. Pt agreed to plan.  Labs Review Labs Reviewed - No data to display  Imaging Review Dg Shoulder Right  09/27/2015  CLINICAL DATA:  Right shoulder pain for 1 week, no known injury, initial encounter EXAM: RIGHT SHOULDER - 2+ VIEW COMPARISON:  None. FINDINGS: There is no evidence of fracture or dislocation. There is no evidence of arthropathy or other focal bone abnormality. Soft tissues are unremarkable. IMPRESSION: No acute abnormality noted. Electronically Signed   By: Inez Catalina M.D.   On: 09/27/2015 14:13   I have personally reviewed and evaluated these images as part of my medical decision-making.   EKG Interpretation None      MDM   Final diagnoses:  Right shoulder pain    Patient presents with right arm pain x 1 week.  VSS, NAD.   Neurovascularly intact.  Mild TTP along anterior shoulder.  Decreased ROM secondary to pain.  No swelling or bruising.  Plain films negative for dislocation or fracture.  Will place in sling for comfort and motrin for pain.  Evaluation does not show pathology requring ongoing emergent intervention or admission. Pt is hemodynamically stable and mentating appropriately. Discussed findings/results and plan with patient/guardian, who agrees with plan. All questions answered. Return precautions discussed and outpatient follow up given.    I personally performed the services described in this documentation, which was scribed in my presence. The recorded information has been reviewed and is accurate.    Gloriann Loan, PA-C 09/27/15 [place], MD 10/11/15 [phone]

## 2015-10-09 ENCOUNTER — Encounter: Payer: Self-pay | Admitting: Internal Medicine

## 2015-10-31 DIAGNOSIS — F4310 Post-traumatic stress disorder, unspecified: Secondary | ICD-10-CM | POA: Diagnosis not present

## 2015-11-24 DIAGNOSIS — Z23 Encounter for immunization: Secondary | ICD-10-CM | POA: Diagnosis not present

## 2015-11-24 DIAGNOSIS — M25511 Pain in right shoulder: Secondary | ICD-10-CM | POA: Diagnosis not present

## 2015-12-07 DIAGNOSIS — M6281 Muscle weakness (generalized): Secondary | ICD-10-CM | POA: Diagnosis not present

## 2015-12-07 DIAGNOSIS — M25611 Stiffness of right shoulder, not elsewhere classified: Secondary | ICD-10-CM | POA: Diagnosis not present

## 2015-12-07 DIAGNOSIS — M25511 Pain in right shoulder: Secondary | ICD-10-CM | POA: Diagnosis not present

## 2015-12-25 DIAGNOSIS — M7541 Impingement syndrome of right shoulder: Secondary | ICD-10-CM | POA: Diagnosis not present

## 2015-12-25 DIAGNOSIS — M25572 Pain in left ankle and joints of left foot: Secondary | ICD-10-CM | POA: Diagnosis not present

## 2015-12-28 ENCOUNTER — Other Ambulatory Visit: Payer: Self-pay

## 2015-12-28 DIAGNOSIS — R5381 Other malaise: Secondary | ICD-10-CM

## 2016-01-07 IMAGING — RF DG C-ARM 61-120 MIN
1 series · 2 of 2 positions shown · non-contrast
Comparison: Left ankle radiographs performed earlier today at [DATE]
p.m.

CLINICAL DATA: External fixation of left ankle fracture. Initial
encounter.

EXAM:
LEFT ANKLE - 2 VIEW

[Series 1: run · 2 of 2 slices shown]
[im 1/2]
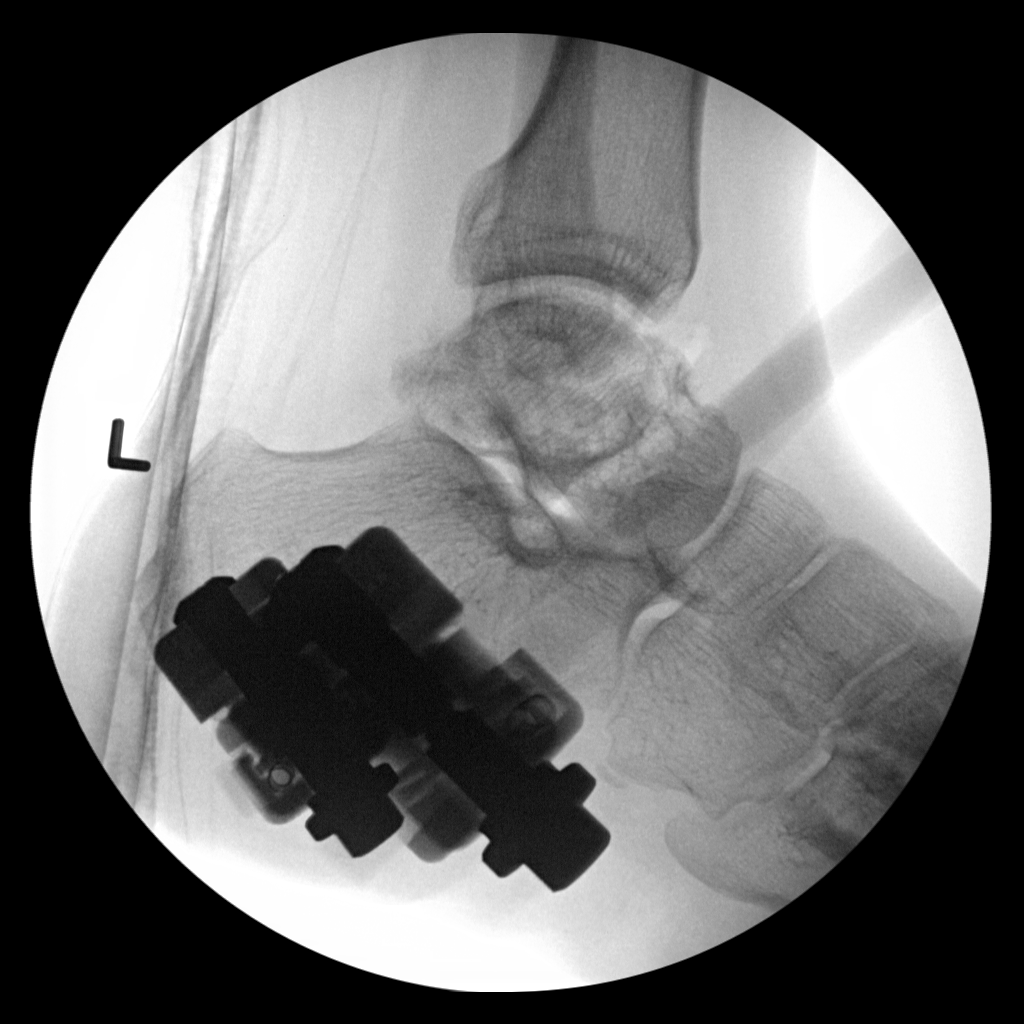
[im 2/2]
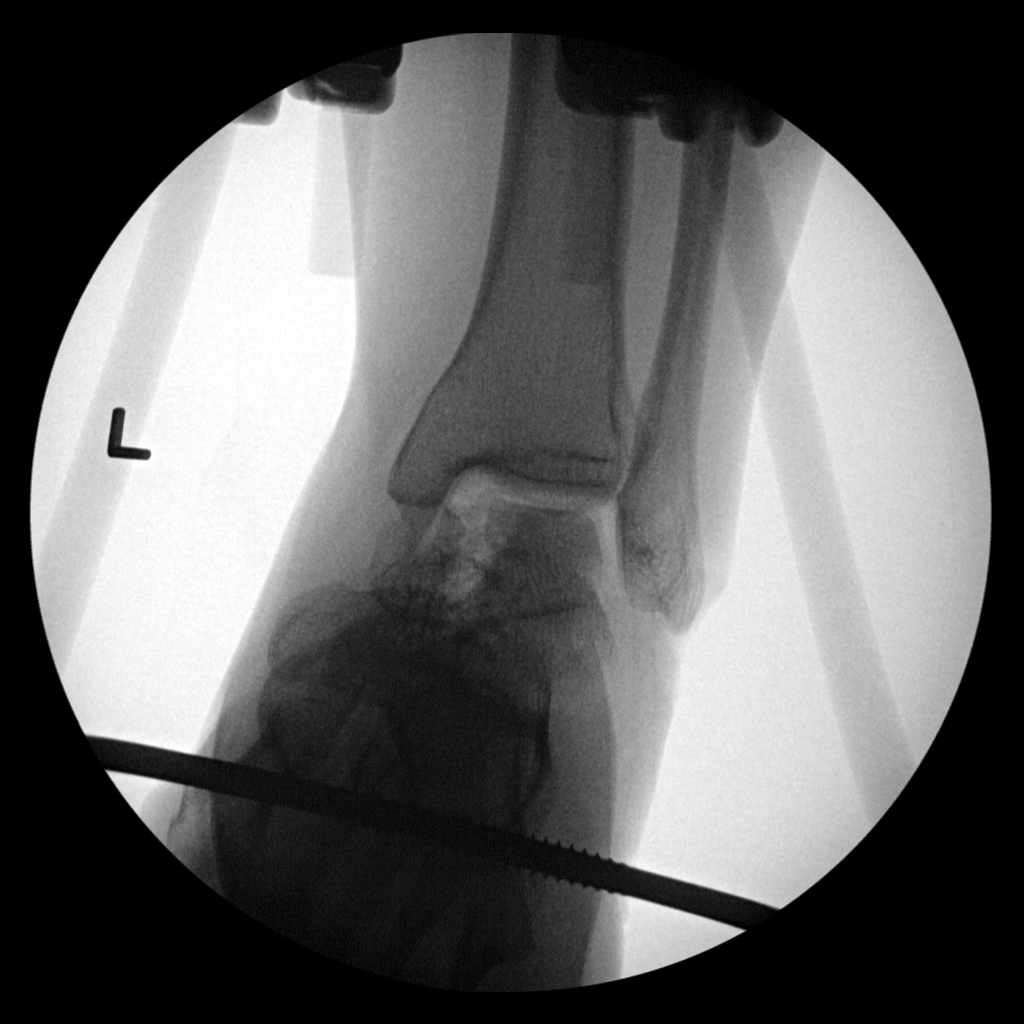

[2 of 2 positions shown; findings below may reference images not displayed]

FINDINGS: Two fluoroscopic C-arm images are provided from the OR. These
demonstrate placement of external fixation hardware across the
midfoot. The patient's significantly comminuted fracture involving
the medial aspect of the talar dome and talar neck are again seen.
No new fractures are identified.
IMPRESSION: Significantly comminuted fracture again noted involving the medial
aspect of the talar dome and talar neck.

## 2016-01-07 IMAGING — DX DG TIBIA/FIBULA 2V*R*
4 series · 4 of 4 positions shown · non-contrast
Comparison: The

CLINICAL DATA: Fall from window, left ankle fracture

EXAM:
RIGHT TIBIA AND FIBULA - 2 VIEW

[tibia ap (1 of 2)]
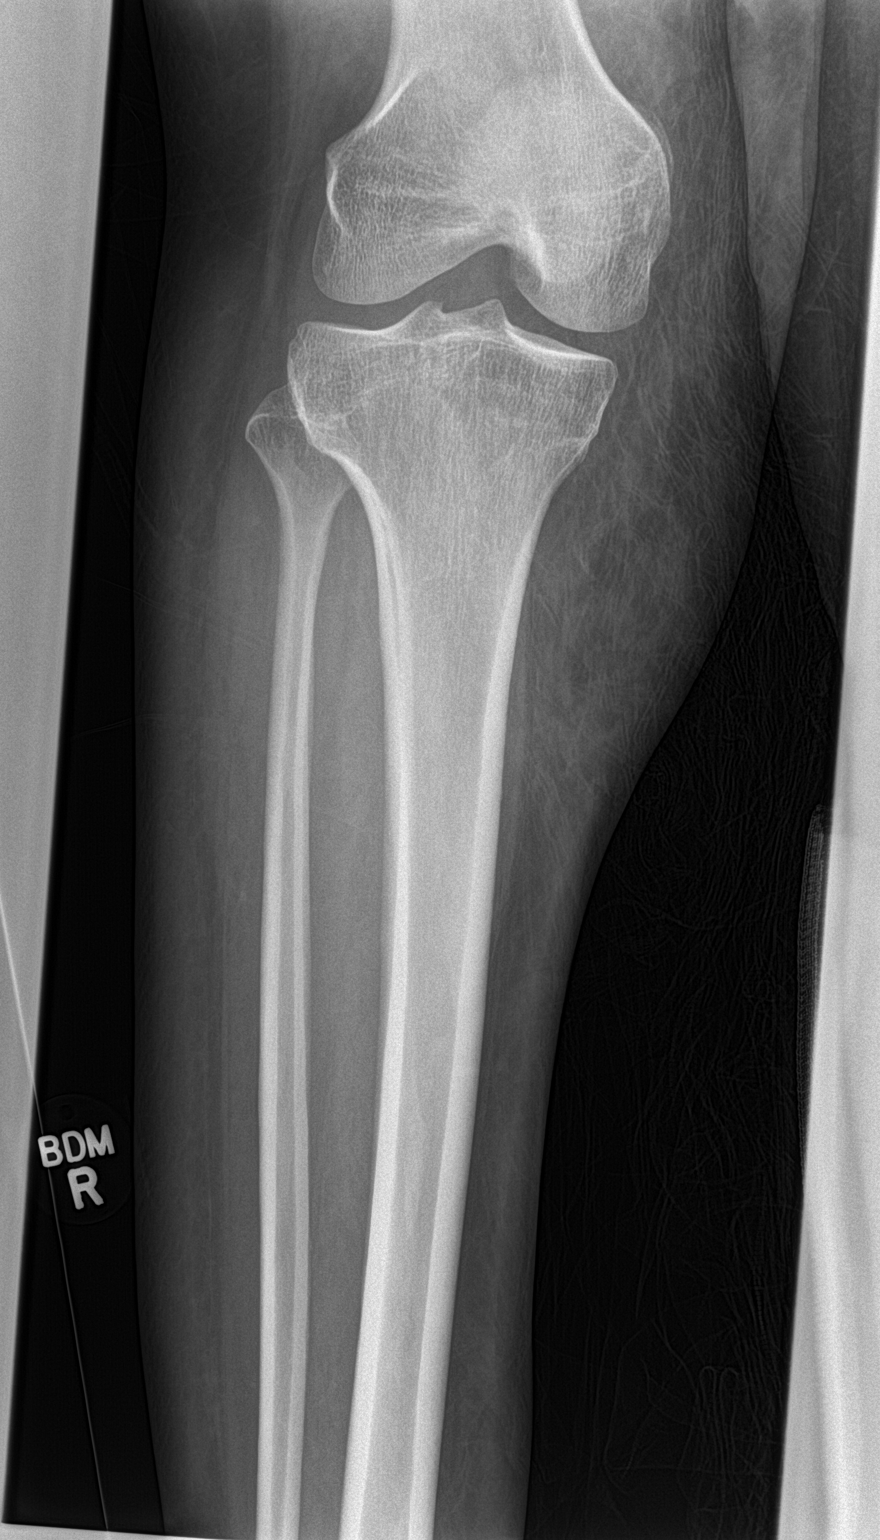

[tibia ap (2 of 2)]
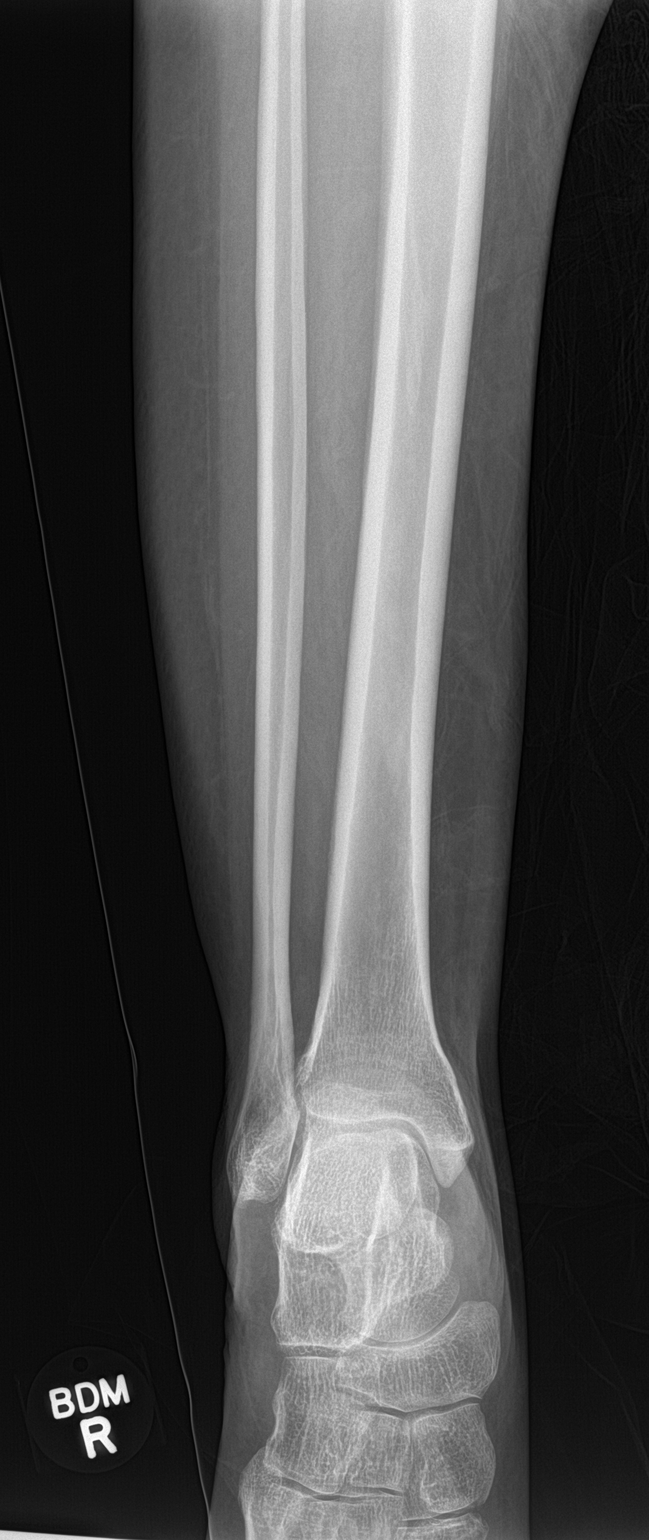

[tibia lat (1 of 2)]
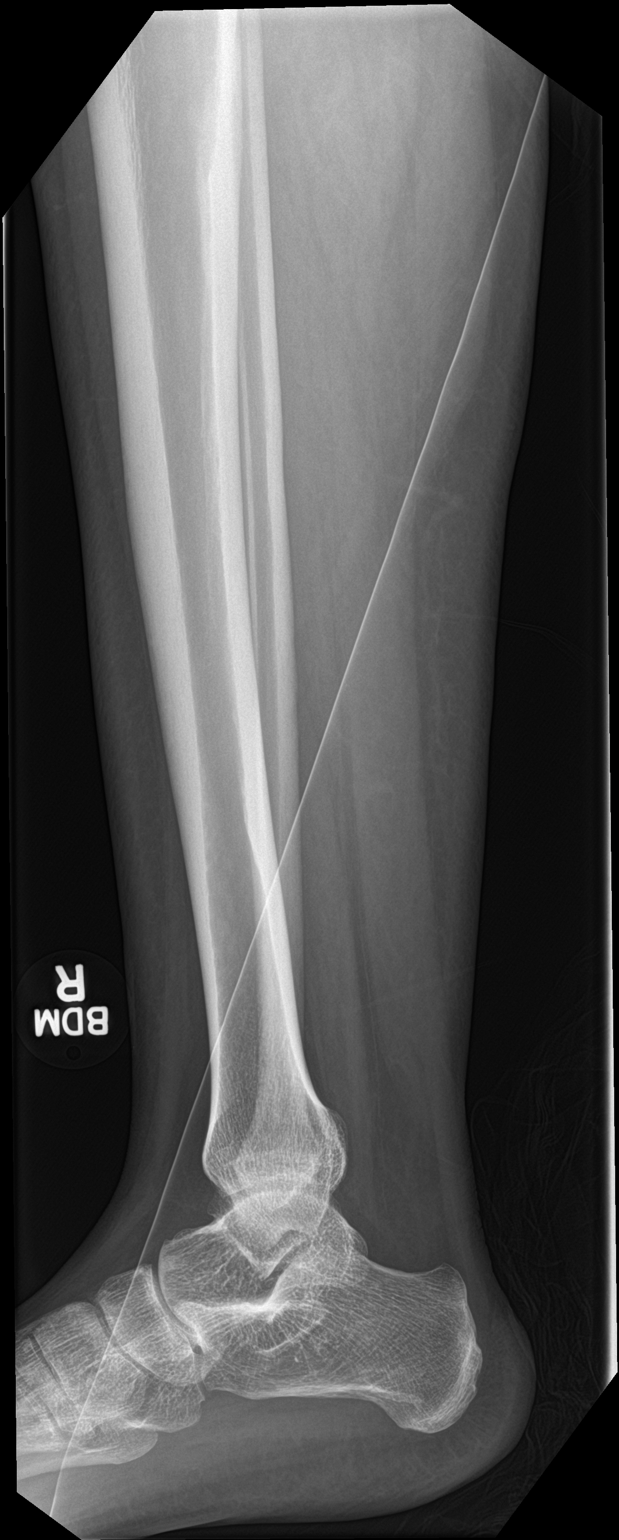

[tibia lat (2 of 2)]
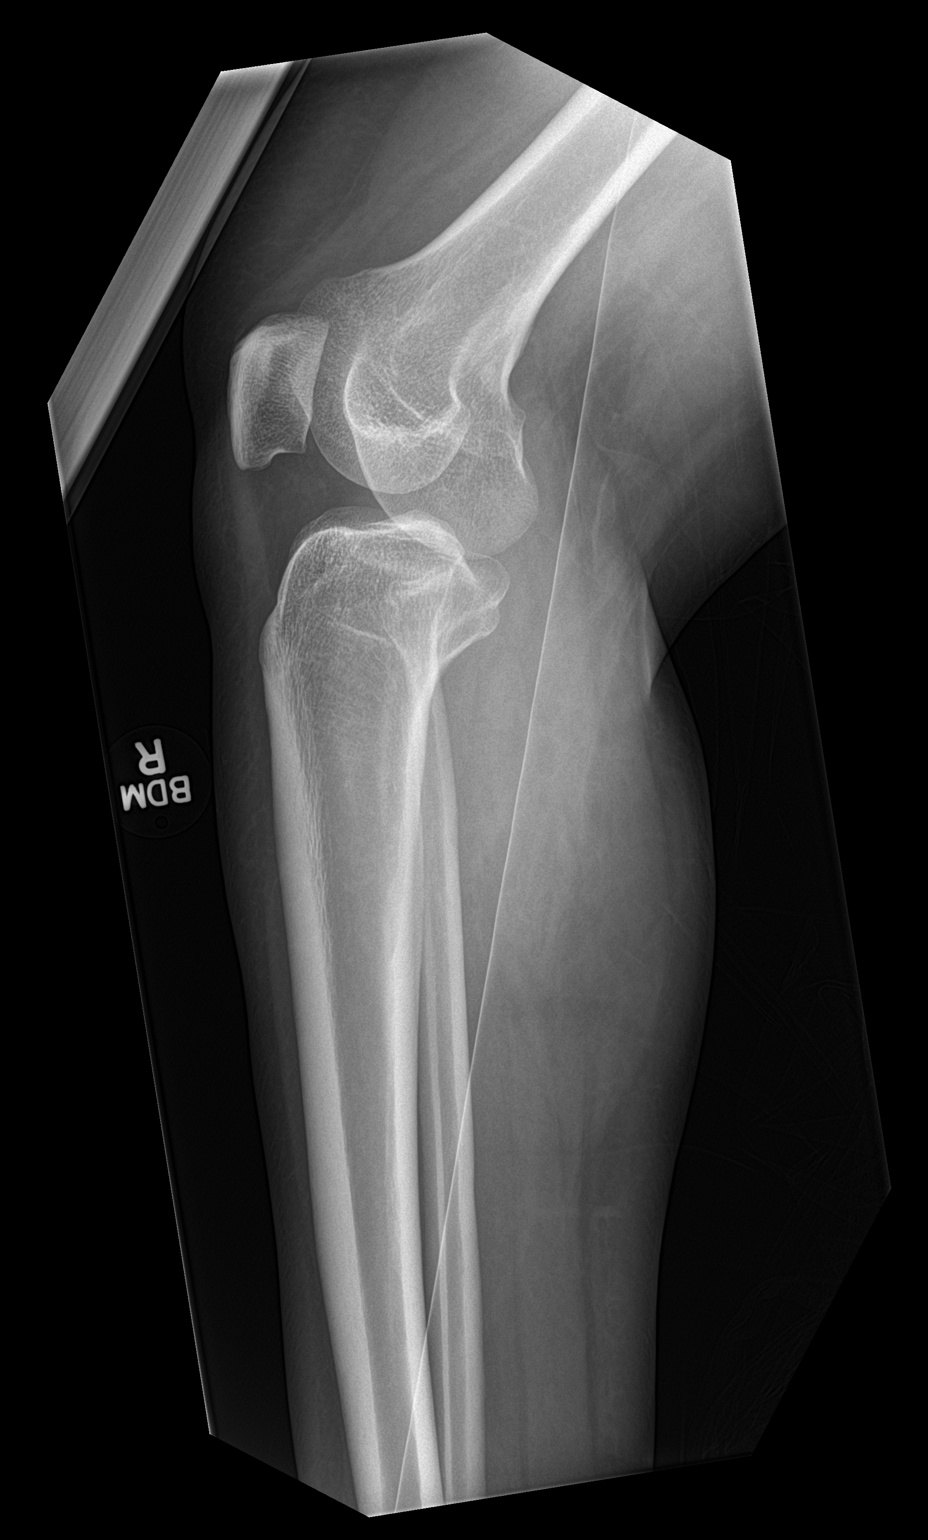

[4 of 4 positions shown; findings below may reference images not displayed]

FINDINGS: Medial soft tissue swelling is identified adjacent to the knee. No
fracture or dislocation is identified. No radiopaque foreign body.
IMPRESSION: Medial soft tissue swelling over the proximal right lower leg.

## 2016-01-07 IMAGING — CT CT HEAD W/O CM
2 series · 16 of 30 positions shown, 18 images · non-contrast
Comparison: 12/09/2007

CLINICAL DATA: Initial evaluation for pain left side of face after
falling from a height

EXAM:
CT HEAD WITHOUT CONTRAST
TECHNIQUE: Contiguous axial images were obtained from the base of the skull
through the vertex without intravenous contrast.

[Series 201: head w/o, idose (1) · axial · non-contrast · 0.43mm/px · z∈[+222,+327]mm · 8 of 29 slices shown, 10 images]
[im 4/29  brain]
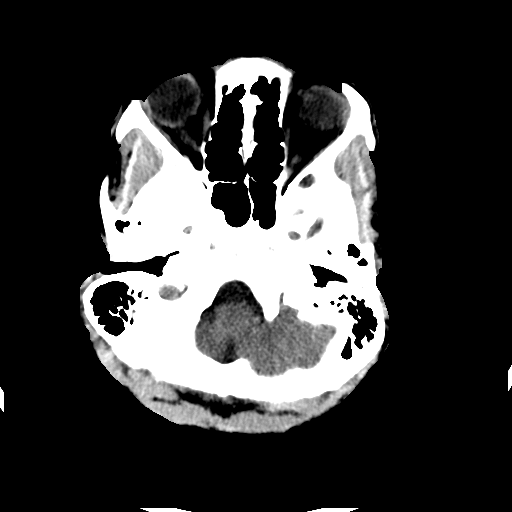
[im 4/29  bone]
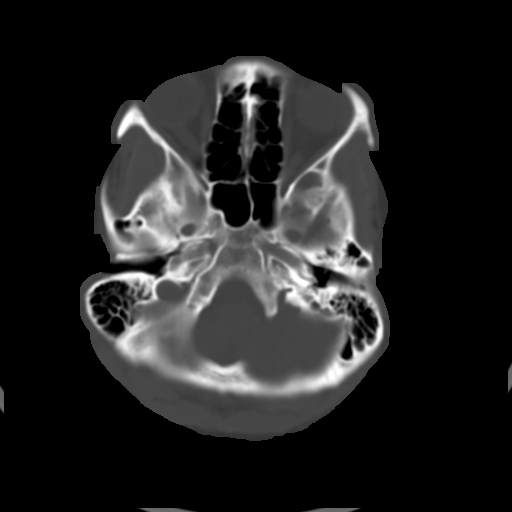
[im 7/29  brain]
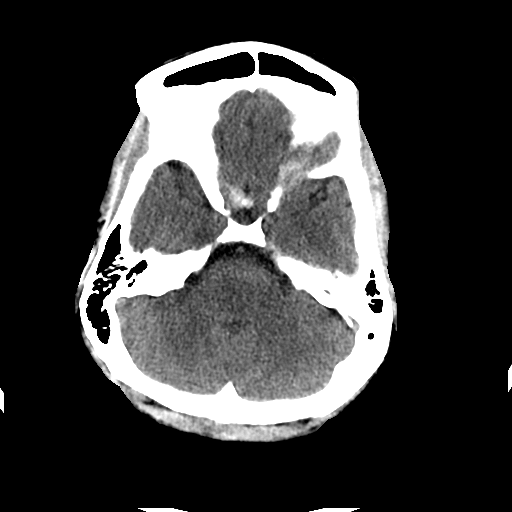
[im 10/29  brain]
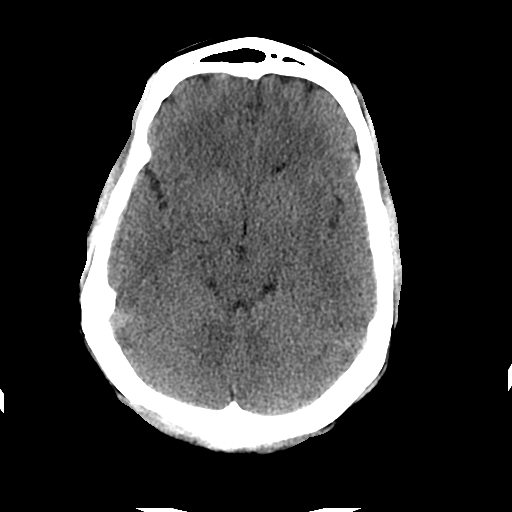
[im 13/29  brain]
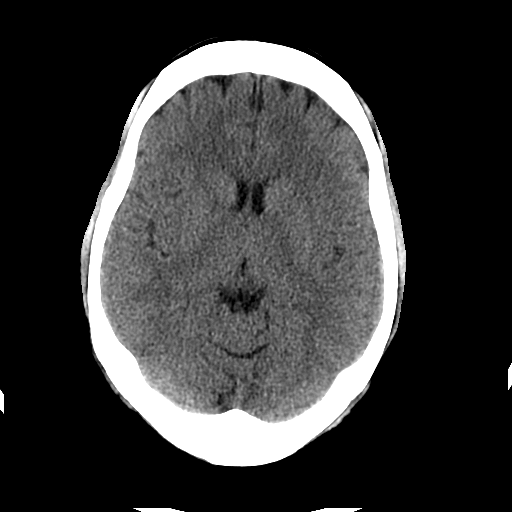
[im 16/29  brain]
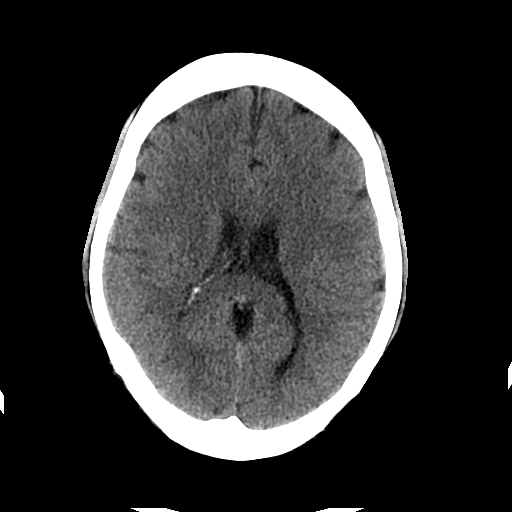
[im 16/29  bone]
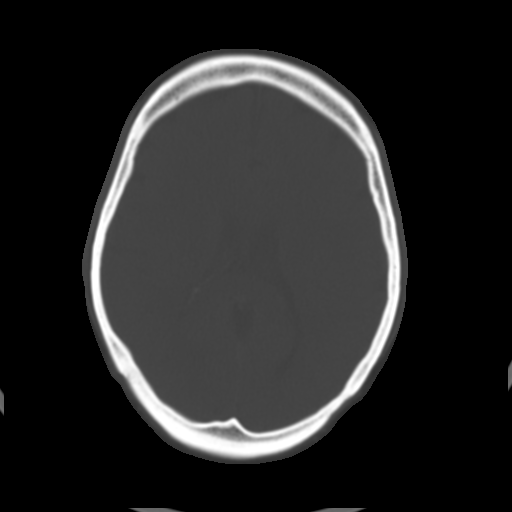
[im 19/29  brain]
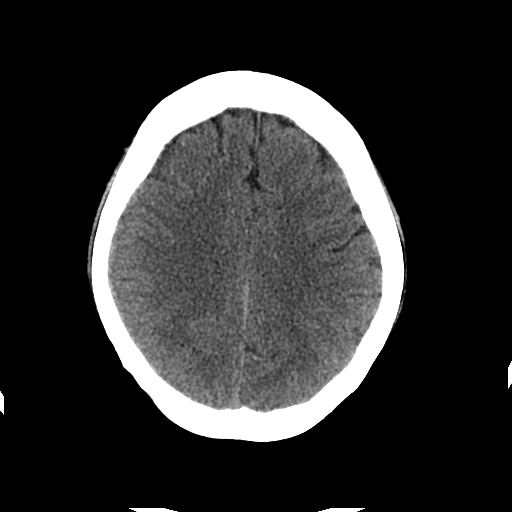
[im 22/29  brain]
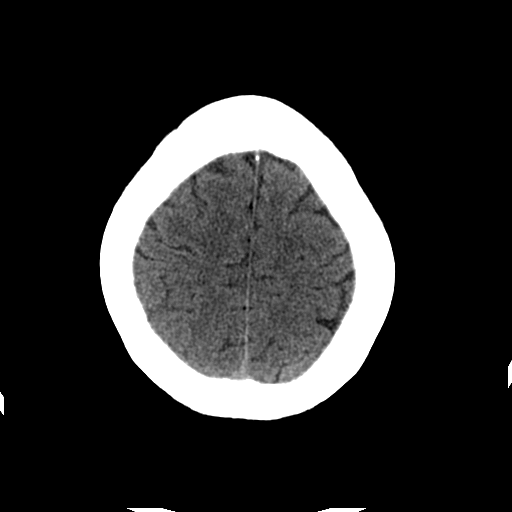
[im 25/29  brain]
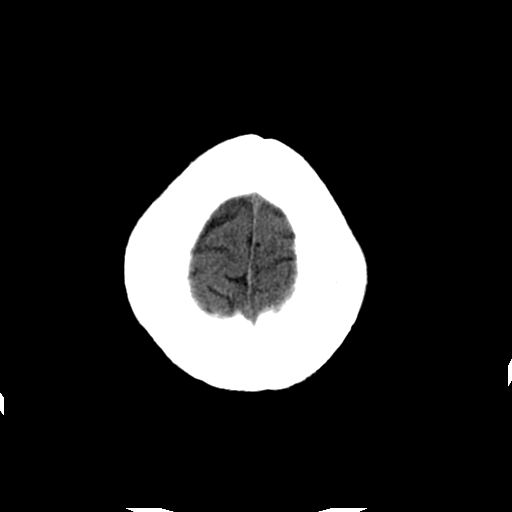

[Series 202: head w/o bone, idose (1) · axial · non-contrast · 0.43mm/px · z∈[+221,+333]mm · 8 of 58 slices shown]
[im 7/58  bone]
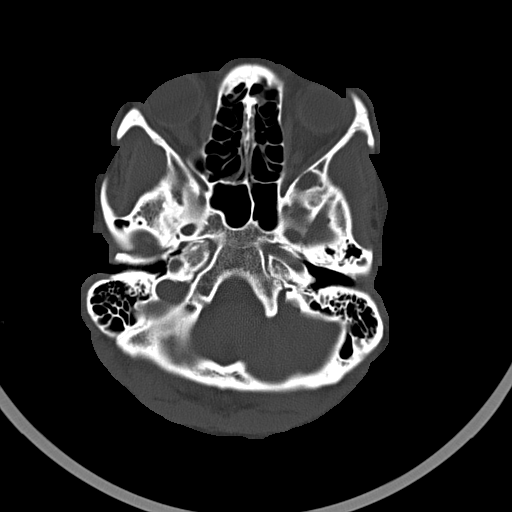
[im 13/58  bone]
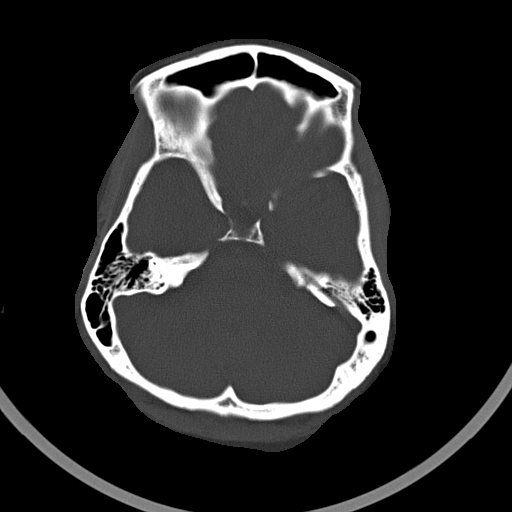
[im 19/58  bone]
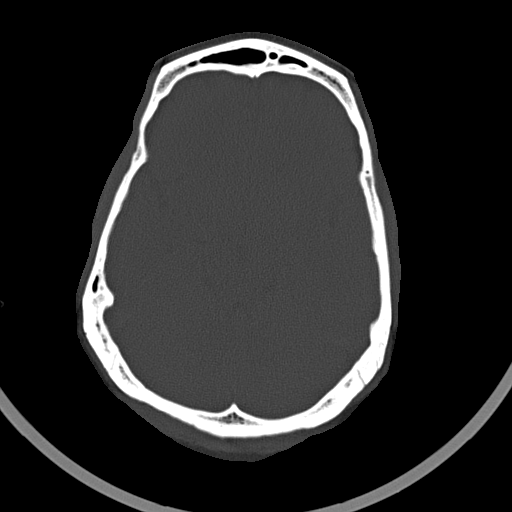
[im 25/58  bone]
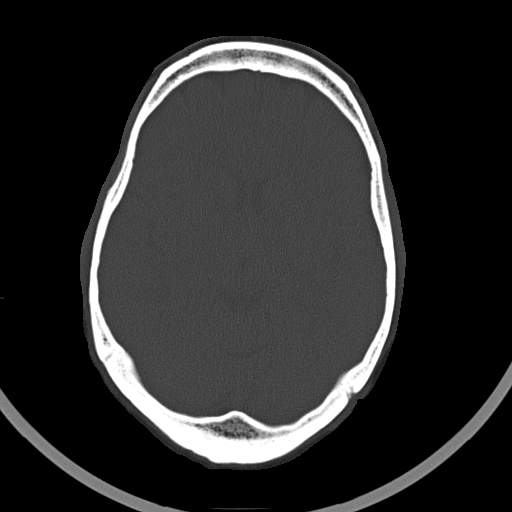
[im 34/58  bone]
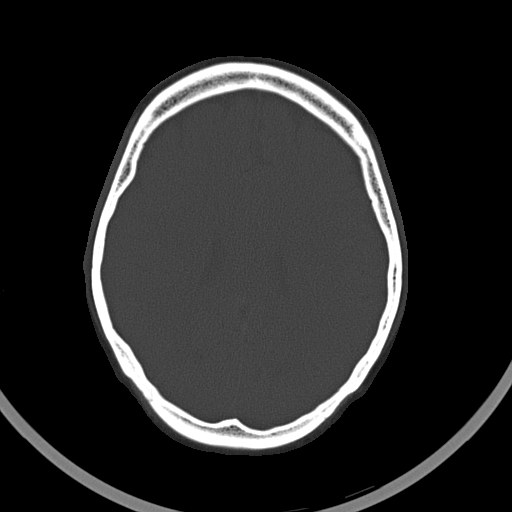
[im 40/58  bone]
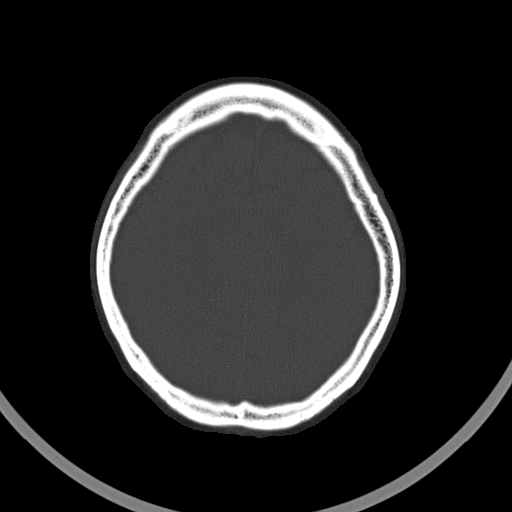
[im 46/58  bone]
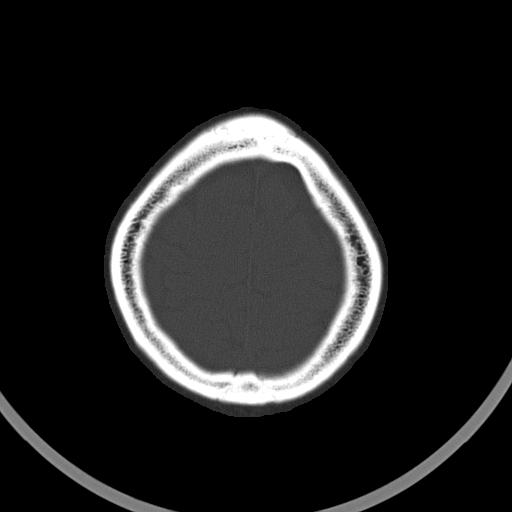
[im 52/58  bone]
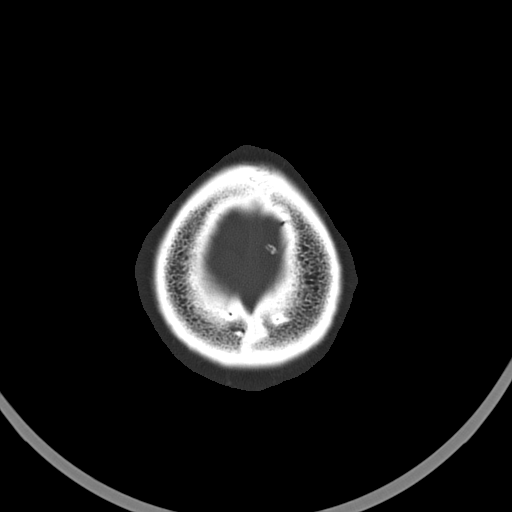

[16 of 30 positions shown; findings below may reference images not displayed]

FINDINGS: No mass lesion. No midline shift. No acute hemorrhage or hematoma.
No extra-axial fluid collections. No evidence of acute infarction.
No skull fracture. Visualized portions of the sinuses are clear.
IMPRESSION: Negative head CT

## 2016-03-11 DIAGNOSIS — M1712 Unilateral primary osteoarthritis, left knee: Secondary | ICD-10-CM | POA: Diagnosis not present

## 2016-03-11 DIAGNOSIS — M25572 Pain in left ankle and joints of left foot: Secondary | ICD-10-CM | POA: Diagnosis not present

## 2016-03-17 ENCOUNTER — Emergency Department (HOSPITAL_COMMUNITY): Payer: Medicare Other

## 2016-03-17 ENCOUNTER — Encounter (HOSPITAL_COMMUNITY): Payer: Self-pay | Admitting: Emergency Medicine

## 2016-03-17 ENCOUNTER — Emergency Department (HOSPITAL_COMMUNITY)
Admission: EM | Admit: 2016-03-17 | Discharge: 2016-03-17 | Disposition: A | Discharge status: Home / Self Care | Payer: Medicare Other | Attending: Emergency Medicine | Admitting: Emergency Medicine

## 2016-03-17 DIAGNOSIS — N63 Unspecified lump in breast: Secondary | ICD-10-CM | POA: Insufficient documentation

## 2016-03-17 DIAGNOSIS — F17210 Nicotine dependence, cigarettes, uncomplicated: Secondary | ICD-10-CM | POA: Insufficient documentation

## 2016-03-17 DIAGNOSIS — N6320 Unspecified lump in the left breast, unspecified quadrant: Secondary | ICD-10-CM

## 2016-03-17 DIAGNOSIS — R1031 Right lower quadrant pain: Secondary | ICD-10-CM | POA: Diagnosis not present

## 2016-03-17 DIAGNOSIS — F329 Major depressive disorder, single episode, unspecified: Secondary | ICD-10-CM | POA: Insufficient documentation

## 2016-03-17 DIAGNOSIS — J45909 Unspecified asthma, uncomplicated: Secondary | ICD-10-CM | POA: Diagnosis not present

## 2016-03-17 DIAGNOSIS — Z791 Long term (current) use of non-steroidal anti-inflammatories (NSAID): Secondary | ICD-10-CM | POA: Diagnosis not present

## 2016-03-17 DIAGNOSIS — E785 Hyperlipidemia, unspecified: Secondary | ICD-10-CM | POA: Insufficient documentation

## 2016-03-17 DIAGNOSIS — Z79899 Other long term (current) drug therapy: Secondary | ICD-10-CM | POA: Diagnosis not present

## 2016-03-17 DIAGNOSIS — Z79891 Long term (current) use of opiate analgesic: Secondary | ICD-10-CM | POA: Insufficient documentation

## 2016-03-17 DIAGNOSIS — Z7951 Long term (current) use of inhaled steroids: Secondary | ICD-10-CM | POA: Diagnosis not present

## 2016-03-17 DIAGNOSIS — N644 Mastodynia: Secondary | ICD-10-CM | POA: Diagnosis not present

## 2016-03-17 LAB — COMPREHENSIVE METABOLIC PANEL
ALK PHOS: 50 U/L (ref 38–126)
ALT: 12 U/L — AB (ref 14–54)
AST: 15 U/L (ref 15–41)
Albumin: 4.2 g/dL (ref 3.5–5.0)
Anion gap: 10 (ref 5–15)
BILIRUBIN TOTAL: 1 mg/dL (ref 0.3–1.2)
BUN: 10 mg/dL (ref 6–20)
CALCIUM: 8.8 mg/dL — AB (ref 8.9–10.3)
CHLORIDE: 103 mmol/L (ref 101–111)
CO2: 25 mmol/L (ref 22–32)
CREATININE: 0.92 mg/dL (ref 0.44–1.00)
Glucose, Bld: 92 mg/dL (ref 65–99)
Potassium: 3.5 mmol/L (ref 3.5–5.1)
Sodium: 138 mmol/L (ref 135–145)
TOTAL PROTEIN: 7.8 g/dL (ref 6.5–8.1)

## 2016-03-17 LAB — URINALYSIS, ROUTINE W REFLEX MICROSCOPIC
Glucose, UA: NEGATIVE mg/dL
Ketones, ur: NEGATIVE mg/dL
Leukocytes, UA: NEGATIVE
Nitrite: NEGATIVE
PROTEIN: NEGATIVE mg/dL
SPECIFIC GRAVITY, URINE: 1.025 (ref 1.005–1.030)
pH: 6 (ref 5.0–8.0)

## 2016-03-17 LAB — CBC
HCT: 40.2 % (ref 36.0–46.0)
Hemoglobin: 13.9 g/dL (ref 12.0–15.0)
MCH: 29.7 pg (ref 26.0–34.0)
MCHC: 34.6 g/dL (ref 30.0–36.0)
MCV: 85.9 fL (ref 78.0–100.0)
PLATELETS: 186 10*3/uL (ref 150–400)
RBC: 4.68 MIL/uL (ref 3.87–5.11)
RDW: 13.1 % (ref 11.5–15.5)
WBC: 11.2 10*3/uL — AB (ref 4.0–10.5)

## 2016-03-17 LAB — DIFFERENTIAL
BASOS PCT: 0 %
Basophils Absolute: 0 10*3/uL (ref 0.0–0.1)
EOS PCT: 1 %
Eosinophils Absolute: 0.1 10*3/uL (ref 0.0–0.7)
LYMPHS PCT: 12 %
Lymphs Abs: 1.4 10*3/uL (ref 0.7–4.0)
MONO ABS: 0.8 10*3/uL (ref 0.1–1.0)
Monocytes Relative: 7 %
NEUTROS PCT: 80 %
Neutro Abs: 9 10*3/uL — ABNORMAL HIGH (ref 1.7–7.7)

## 2016-03-17 LAB — URINE MICROSCOPIC-ADD ON: WBC UA: NONE SEEN WBC/hpf (ref 0–5)

## 2016-03-17 LAB — LIPASE, BLOOD: Lipase: 28 U/L (ref 11–51)

## 2016-03-17 MED ORDER — ONDANSETRON HCL 4 MG PO TABS: 4.0000 mg | ORAL_TABLET | Freq: Three times a day (TID) | ORAL | Status: DC | PRN

## 2016-03-17 MED ORDER — SODIUM CHLORIDE 0.9 % IV BOLUS (SEPSIS)
1000.0000 mL | Freq: Once | INTRAVENOUS | Status: AC
  Administered 2016-03-17: 1000 mL via INTRAVENOUS

## 2016-03-17 MED ORDER — IOPAMIDOL (ISOVUE-300) INJECTION 61%
100.0000 mL | Freq: Once | INTRAVENOUS | Status: AC | PRN
  Administered 2016-03-17: 100 mL via INTRAVENOUS

## 2016-03-17 MED ORDER — DIATRIZOATE MEGLUMINE & SODIUM 66-10 % PO SOLN
15.0000 mL | Freq: Once | ORAL | Status: AC
  Administered 2016-03-17: 30 mL via ORAL

## 2016-03-17 MED ORDER — ONDANSETRON HCL 4 MG/2ML IJ SOLN
4.0000 mg | Freq: Once | INTRAMUSCULAR | Status: AC
  Administered 2016-03-17: 4 mg via INTRAVENOUS
  Filled 2016-03-17: qty 2

## 2016-03-17 MED ORDER — HYDROCODONE-ACETAMINOPHEN 5-325 MG PO TABS: 1.0000 | ORAL_TABLET | ORAL | Status: DC | PRN

## 2016-03-17 MED ORDER — MORPHINE SULFATE (PF) 4 MG/ML IV SOLN
4.0000 mg | Freq: Once | INTRAVENOUS | Status: AC
  Administered 2016-03-17: 4 mg via INTRAVENOUS
  Filled 2016-03-17: qty 1

## 2016-03-17 NOTE — ED Notes (Addendum)
Patient transported to x-ray. ?

## 2016-03-17 NOTE — ED Notes (Signed)
Patient was educated not to drive, operate heavy machinery, or drink alcohol while taking narcotic medication.  

## 2016-03-17 NOTE — Discharge Instructions (Signed)
Your CT scan does not show any serious cause of her abdominal pain. Return for worsening symptoms including worsening pain, vomiting unable to keep down food or fluids, fever, or any other symptoms concerning to you.  You definitely need a mammogram and breast ultrasound for the mass in the left breast. He has been given referral to general surgery and woman's clinic above to arrange this through.    Abdominal Pain, Adult Many things can cause belly (abdominal) pain. Most times, the belly pain is not dangerous. Many cases of belly pain can be watched and treated at home. HOME CARE   Do not take medicines that help you go poop (laxatives) unless told to by your doctor.  Only take medicine as told by your doctor.  Eat or drink as told by your doctor. Your doctor will tell you if you should be on a special diet. GET HELP IF:  You do not know what is causing your belly pain.  You have belly pain while you are sick to your stomach (nauseous) or have runny poop (diarrhea).  You have pain while you pee or poop.  Your belly pain wakes you up at night.  You have belly pain that gets worse or better when you eat.  You have belly pain that gets worse when you eat fatty foods.  You have a fever. GET HELP RIGHT AWAY IF:   The pain does not go away within 2 hours.  You keep throwing up (vomiting).  The pain changes and is only in the right or left part of the belly.  You have bloody or tarry looking poop. MAKE SURE YOU:   Understand these instructions.  Will watch your condition.  Will get help right away if you are not doing well or get worse.   This information is not intended to replace advice given to you by your health care provider. Make sure you discuss any questions you have with your health care provider.   Document Released: 04/15/2008 Document Revised: 11/18/2014 Document Reviewed: 07/07/2013 Elsevier Interactive Patient Education [employer].

## 2016-03-17 NOTE — ED Notes (Addendum)
Burning and mass felt to left breast this past week. Also c/o woke up with RLQ abdominal pain and nausea. RLQ pain hurts worse with walking, does still have appendix. No fever in triage. Denies urinary or vaginal complaints.

## 2016-03-17 NOTE — ED Provider Notes (Signed)
CSN: [id number]     Arrival date & time 03/17/16  1053 History   First MD Initiated Contact with Patient 03/17/16 1137     Chief Complaint  Patient presents with  . Abdominal Pain  . Breast Mass     (Consider location/radiation/quality/duration/timing/severity/associated sxs/prior Treatment) HPI 40 year old female who presents with breast mass and abdominal pain. She has history of asthma, depression, menorrhagia s/p transvaginal hysterectomy. Reports several month history of left breast mass, recent more painful over past few days. Initially painless, but notes a burning pain. No associating sob, syncope, near syncope. Size stable, and no overlying skin changes. Reports that she has attempted to get mammogram, but unable to get referral because she had no primary care physician.  Yesterday also noted RLQ abdominal pain, gradually worsening, exacerbated by movement. Not associated with eating. No N/V/D, constipation, F/C, vaginal bleeding or discharge, dysuria, urinary frequency, flank pain.   Past Medical History  Diagnosis Date  . Depression   . Shortness of breath     on exertion  . Sleep apnea     mild per pt- no CPAP  . Headache(784.0)     migraines  . Anemia   . Asthma     no inhaler  . Menorrhagia with regular cycle 01/20/2013  . S/P endometrial ablation 01/20/2013  . S/P laparoscopic assisted vaginal hysterectomy (LAVH) 01/21/2013  . Preoperative clearance   . Hyperlipidemia    Past Surgical History  Procedure Laterality Date  . Tubal ligation    . Breast lumpectomy      RT side  . Laparoscopic assisted vaginal hysterectomy N/A 01/21/2013    Procedure: LAPAROSCOPIC ASSISTED VAGINAL HYSTERECTOMY;  Surgeon: Thornell Sartorius, MD;  Location: [facility];  Service: Gynecology;  Laterality: N/A;  . Bilateral salpingectomy N/A 01/21/2013    Procedure: BILATERAL SALPINGECTOMY;  Surgeon: Thornell Sartorius, MD;  Location: [facility];  Service: Gynecology;  Laterality: N/A;  . Abdominal hysterectomy     . Thyroid lobectomy Left 10/01/2013    Procedure: THYROID LOBECTOMY;  Surgeon: Madilyn Hook, DO;  Location: [facility];  Service: General;  Laterality: Left;  . I&d extremity Left 10/14/2014    Procedure: IRRIGATION AND DEBRIDEMENT OPEN TIBIA FRACTURE LEFT LEG;  Surgeon: Marianna Payment, MD;  Location: [place];  Service: Orthopedics;  Laterality: Left;  . External fixation leg Left 10/14/2014    Procedure: EXTERNAL FIXATION LEFT ANKLE;  Surgeon: Marianna Payment, MD;  Location: [place];  Service: Orthopedics;  Laterality: Left;  . External fixation removal Left 10/19/2014    Procedure: REMOVAL EXTERNAL FIXATION LEG;  Surgeon: Newt Minion, MD;  Location: [place];  Service: Orthopedics;  Laterality: Left;  . Ankle fusion Left 10/19/2014    Procedure: Tibiocalcaneal Fusion;  Surgeon: Newt Minion, MD;  Location: [place];  Service: Orthopedics;  Laterality: Left;   Family History  Problem Relation Age of Onset  . Depression Father    Social History  Substance Use Topics  . Smoking status: Current Some Day Smoker -- 0.25 packs/day    Types: Cigarettes  . Smokeless tobacco: Never Used     Comment: 1 pack will last a week and a half  . Alcohol Use: No   OB History    No data available     Review of Systems 10/14 systems reviewed and are negative other than those stated in the HPI    Allergies  Shrimp  Home Medications   Prior to Admission medications   Medication Sig  Start Date End Date Taking? Authorizing Provider  albuterol (PROVENTIL HFA;VENTOLIN HFA) 108 (90 BASE) MCG/ACT inhaler Inhale 2 puffs into the lungs every 6 (six) hours as needed for wheezing. Reported on 03/17/2016 05/25/13  Yes Leandrew Koyanagi, MD  DULoxetine (CYMBALTA) 30 MG capsule Take 30 mg by mouth at bedtime. 02/29/16  Yes Historical Provider, MD  DULoxetine (CYMBALTA) 60 MG capsule Take 60 mg by mouth daily. 02/29/16  Yes Historical Provider, MD  ibuprofen (ADVIL,MOTRIN) 800 MG tablet Take 1 tablet (800 mg total) by  mouth 3 (three) times daily. 09/27/15  Yes Gloriann Loan, PA-C  traZODone (DESYREL) 100 MG tablet Take 100-200 mg by mouth at bedtime.    Yes Historical Provider, MD  VYVANSE 20 MG capsule Take 20 mg by mouth every morning. 03/04/16  Yes Historical Provider, MD  HYDROcodone-acetaminophen (HYCET) 7.5-325 mg/15 ml solution Take 10-15 mLs by mouth every 4 (four) hours as needed for moderate pain. Patient not taking: Reported on 03/17/2016 10/18/13   Judeth Horn, MD  HYDROcodone-acetaminophen (NORCO/VICODIN) 5-325 MG tablet Take 1 tablet by mouth every 4 (four) hours as needed. 03/17/16   Forde Dandy, MD  methocarbamol (ROBAXIN) 500 MG tablet Take 1 tablet (500 mg total) by mouth 3 (three) times daily. Patient not taking: Reported on 03/17/2016 10/24/14   Newt Minion, MD  ondansetron (ZOFRAN) 4 MG tablet Take 1 tablet (4 mg total) by mouth every 8 (eight) hours as needed for nausea or vomiting. 03/17/16   Forde Dandy, MD  oxyCODONE-acetaminophen (ROXICET) 5-325 MG per tablet Take 1 tablet by mouth every 4 (four) hours as needed for severe pain. Patient not taking: Reported on 03/17/2016 10/24/14   Newt Minion, MD   BP 116/84 mmHg  Pulse 86  Temp(Src) 98.3 F (36.8 C) (Oral)  Resp 18  SpO2 99%  LMP 01/07/2013 Physical Exam Physical Exam  Nursing note and vitals reviewed. Constitutional: Well developed, well nourished, non-toxic, and in no acute distress Head: Normocephalic and atraumatic.  Mouth/Throat: Oropharynx is clear and moist.  Neck: Normal range of motion. Neck supple.  Cardiovascular: Normal rate and regular rhythm.   Breast/Chest: Enlarged mass palpated in the left breast, tender to palpation without overlying skin changes.  Pulmonary: Effort normal and breath sounds normal.  Abdominal: Soft. There is RLQ tenderness. There is no rebound and no guarding. No CVA tenderness. Negative Murphy's sign. Musculoskeletal: Normal range of motion.  Neurological: Alert, no facial droop, fluent speech,  moves all extremities symmetrically Skin: Skin is warm and dry.  Psychiatric: Cooperative  ED Course  Procedures (including critical care time) Labs Review Labs Reviewed  COMPREHENSIVE METABOLIC PANEL - Abnormal; Notable for the following:    Calcium 8.8 (*)    ALT 12 (*)    All other components within normal limits  CBC - Abnormal; Notable for the following:    WBC 11.2 (*)    All other components within normal limits  URINALYSIS, ROUTINE W REFLEX MICROSCOPIC (NOT AT [facility]) - Abnormal; Notable for the following:    Color, Urine AMBER (*)    APPearance HAZY (*)    Hgb urine dipstick MODERATE (*)    Bilirubin Urine SMALL (*)    All other components within normal limits  URINE MICROSCOPIC-ADD ON - Abnormal; Notable for the following:    Squamous Epithelial / LPF TOO NUMEROUS TO COUNT (*)    Bacteria, UA MANY (*)    All other components within normal limits  DIFFERENTIAL - Abnormal; Notable for  the following:    Neutro Abs 9.0 (*)    All other components within normal limits  LIPASE, BLOOD    Imaging Review Dg Chest 2 View  03/17/2016  CLINICAL DATA:  Burning and mass felt in left breast for a week. EXAM: CHEST  2 VIEW COMPARISON:  May 25, 2013 FINDINGS: The heart size and mediastinal contours are within normal limits. Both lungs are clear. The visualized skeletal structures are unremarkable. IMPRESSION: No active cardiopulmonary disease. Electronically Signed   By: Dorise Bullion III M.D   On: 03/17/2016 12:43   Ct Abdomen Pelvis W Contrast  03/17/2016  CLINICAL DATA:  Right lower quadrant pain and nausea EXAM: CT ABDOMEN AND PELVIS WITH CONTRAST TECHNIQUE: Multidetector CT imaging of the abdomen and pelvis was performed using the standard protocol following bolus administration of intravenous contrast. CONTRAST:  132mL ISOVUE-300 IOPAMIDOL (ISOVUE-300) INJECTION 61% COMPARISON:  None. FINDINGS: Lower chest:  Lung bases are clear. Hepatobiliary: There is a focal area of enhancement  in the abdomen the liver on the left measuring 1.3 x 0.9 cm best seen on axial slice 11 series 2. A centrally enhancing lesion is noted nearby in the dome of the liver on the left measuring 1.3 x 1.2 cm, seen on axial slice 9 series 2. There is a subtle enhancing lesion in the medial segment of the left lobe of the liver more inferiorly measuring 9 x 8 mm on axial slice 19 series 2. A subtle centrally enhancing lesion in the posterior segment of the right lobe of the liver measuring 8 x 7 mm is noted on axial slice 27 series 2. No other liver lesions are apparent. Gallbladder wall is not appreciably thickened. No biliary duct dilatation. Pancreas: No pancreatic mass or inflammatory focus is evident. Pancreatic duct is upper normal in size without focal pancreatic ductal region lesion. Spleen: No splenic lesions are evident. Adrenals/Urinary Tract: Adrenals appear normal bilaterally. Kidneys bilaterally show no mass or hydronephrosis on either side. There is no renal or ureteral calculus on either side. The urinary bladder is midline with wall thickness within normal limits. Stomach/Bowel: There is no bowel wall or mesenteric thickening. There is no bowel obstruction. No free air or portal venous air. Vascular/Lymphatic: There is no abdominal aortic aneurysm. No vascular lesions are evident. There is no adenopathy in the abdomen or pelvis. Reproductive: Uterus is absent. By report, ovaries are absent. There is a questionable ovarian remnant in the anterior left pelvis without mass. Other: Appendix appears unremarkable. There is no abscess or ascites in the abdomen or pelvis. Musculoskeletal: There are no blastic or lytic bone lesions. There is no intramuscular or abdominal wall lesion. IMPRESSION: Small liver lesions of uncertain etiology. These lesions may all represent small hemangiomas. Confirmation with pre and serial post-contrast MR or CT would be advisable. Pancreatic duct upper normal in size without focal  pancreatic mass or inflammatory focus evident. No bowel obstruction.  No abscess.  Appendix appears normal. No renal or ureteral calculus.  No hydronephrosis. Suspect left ovarian remnant.  No demonstrable pelvic mass. Electronically Signed   By: Lowella Grip III M.D.   On: 03/17/2016 14:27   I have personally reviewed and evaluated these images and lab results as part of my medical decision-making.   EKG Interpretation None      MDM   Final diagnoses:  Left breast mass  RLQ abdominal pain    40 year old female who presents with one day of RLQ abdominal pain and several  month history of left breast mass. Vital signs within normal limits in ED. She is well appearing. Abdomen nonsurgical, but with focal RLQ tenderness. Unremarkable CBC, CMP, UA. CXR without acute cardiopulmonary processes. CT abd/pelvis without acute processes.   In regard to her breast mass, discussed concern for need for mammogram and dedicated breast US. Given referral to women's clinic and surgery to have follow-up regarding evaluation for malignancy vs cyst vs other breast process.   Work-up does not suggest serious intraabdominal process. ? Of MSK origin given negative blood work and imaging and exacerbation with movement. Discussed supportive care for home. Strict return and follow-up instructions reviewed. She expressed understanding of all discharge instructions and felt comfortable with the plan of care.     Forde Dandy, MD 03/17/16 [phone]

## 2016-04-17 ENCOUNTER — Ambulatory Visit (INDEPENDENT_AMBULATORY_CARE_PROVIDER_SITE_OTHER): Payer: Medicare Other | Admitting: Obstetrics & Gynecology

## 2016-04-17 ENCOUNTER — Encounter: Payer: Self-pay | Admitting: Obstetrics & Gynecology

## 2016-04-17 VITALS — BP 124/80 | HR 99 | Resp 20 | Ht 66.5 in | Wt 159.0 lb

## 2016-04-17 DIAGNOSIS — N6320 Unspecified lump in the left breast, unspecified quadrant: Secondary | ICD-10-CM

## 2016-04-17 DIAGNOSIS — N63 Unspecified lump in breast: Secondary | ICD-10-CM | POA: Diagnosis not present

## 2016-04-17 DIAGNOSIS — N6019 Diffuse cystic mastopathy of unspecified breast: Secondary | ICD-10-CM

## 2016-04-17 NOTE — Progress Notes (Signed)
Patient ID: [name], female   DOB: 1976/06/28, 40 y.o.   MRN: [id number]  Chief Complaint  Patient presents with  . Referral    [facility]  breast mass on left  HPI [name] is a 40 y.o. female.  [id number] Patient's last menstrual period was 01/07/2013. S/P LAVH 2014, h/o of fibrocystic breast and had right breast aspiration 2012, with 3 months of sore left breast mass and is referred by ED. Still notes the mass HPI  Past Medical History  Diagnosis Date  . Depression   . Shortness of breath     on exertion  . Sleep apnea     mild per pt- no CPAP  . Headache(784.0)     migraines  . Anemia   . Asthma     no inhaler  . Menorrhagia with regular cycle 01/20/2013  . S/P endometrial ablation 01/20/2013  . S/P laparoscopic assisted vaginal hysterectomy (LAVH) 01/21/2013  . Preoperative clearance   . Hyperlipidemia     Past Surgical History  Procedure Laterality Date  . Tubal ligation    . Breast lumpectomy      RT side  . Laparoscopic assisted vaginal hysterectomy N/A 01/21/2013    Procedure: LAPAROSCOPIC ASSISTED VAGINAL HYSTERECTOMY;  Surgeon: Thornell Sartorius, MD;  Location: [facility];  Service: Gynecology;  Laterality: N/A;  . Bilateral salpingectomy N/A 01/21/2013    Procedure: BILATERAL SALPINGECTOMY;  Surgeon: Thornell Sartorius, MD;  Location: [facility];  Service: Gynecology;  Laterality: N/A;  . Abdominal hysterectomy    . Thyroid lobectomy Left 10/01/2013    Procedure: THYROID LOBECTOMY;  Surgeon: Madilyn Hook, DO;  Location: [facility];  Service: General;  Laterality: Left;  . I&d extremity Left 10/14/2014    Procedure: IRRIGATION AND DEBRIDEMENT OPEN TIBIA FRACTURE LEFT LEG;  Surgeon: Marianna Payment, MD;  Location: [place];  Service: Orthopedics;  Laterality: Left;  . External fixation leg Left 10/14/2014    Procedure: EXTERNAL FIXATION LEFT ANKLE;  Surgeon: Marianna Payment, MD;  Location: [place];  Service: Orthopedics;  Laterality: Left;  . External fixation removal Left 10/19/2014   Procedure: REMOVAL EXTERNAL FIXATION LEG;  Surgeon: Newt Minion, MD;  Location: [place];  Service: Orthopedics;  Laterality: Left;  . Ankle fusion Left 10/19/2014    Procedure: Tibiocalcaneal Fusion;  Surgeon: Newt Minion, MD;  Location: [place];  Service: Orthopedics;  Laterality: Left;    Family History  Problem Relation Age of Onset  . Depression Father     Social History Social History  Substance Use Topics  . Smoking status: Current Some Day Smoker -- 0.25 packs/day    Types: Cigarettes  . Smokeless tobacco: Never Used     Comment: 1 pack will last a week and a half  . Alcohol Use: No    Allergies  Allergen Reactions  . Shrimp [Shellfish Allergy] Swelling    Current Outpatient Prescriptions  Medication Sig Dispense Refill  . DULoxetine (CYMBALTA) 30 MG capsule Take 30 mg by mouth at bedtime.  1  . DULoxetine (CYMBALTA) 60 MG capsule Take 60 mg by mouth daily.  1  . HYDROcodone-acetaminophen (NORCO/VICODIN) 5-325 MG tablet Take 1 tablet by mouth every 4 (four) hours as needed. 6 tablet 0  . ibuprofen (ADVIL,MOTRIN) 800 MG tablet Take 1 tablet (800 mg total) by mouth 3 (three) times daily. 21 tablet 0  . Melatonin 3 MG TABS Take 1 tablet by mouth at bedtime.    . ondansetron (ZOFRAN) 4 MG tablet Take 1  tablet (4 mg total) by mouth every 8 (eight) hours as needed for nausea or vomiting. 12 tablet 0  . oxyCODONE-acetaminophen (ROXICET) 5-325 MG per tablet Take 1 tablet by mouth every 4 (four) hours as needed for severe pain. 60 tablet 0  . traZODone (DESYREL) 100 MG tablet Take 100-200 mg by mouth at bedtime.     Marland Kitchen VYVANSE 20 MG capsule Take 20 mg by mouth every morning.  0  . albuterol (PROVENTIL HFA;VENTOLIN HFA) 108 (90 BASE) MCG/ACT inhaler Inhale 2 puffs into the lungs every 6 (six) hours as needed for wheezing. Reported on 04/17/2016    . methocarbamol (ROBAXIN) 500 MG tablet Take 1 tablet (500 mg total) by mouth 3 (three) times daily. (Patient not taking: Reported on  03/17/2016) 30 tablet 0   No current facility-administered medications for this visit.    Review of Systems Review of Systems  Constitutional: Negative.   Respiratory: Negative.   Genitourinary: Negative.   Skin:       Left breast mass with burning pain    Blood pressure 124/80, pulse 99, resp. rate 20, height 5' 6.5" (1.689 m), weight 72.122 kg (159 lb), last menstrual period 01/07/2013.  Physical Exam Physical Exam  Constitutional: She is oriented to person, place, and time. She appears well-developed. No distress.  Cardiovascular: Normal rate.   Pulmonary/Chest: Effort normal. No respiratory distress.  Fibrocystic changes right breast and large 6x7 cm mobile breast mass on left minimal tenderness upper outer quadrant no axillary mass  Neurological: She is alert and oriented to person, place, and time.  Skin: Skin is warm and dry.  Psychiatric: She has a normal mood and affect. Her behavior is normal.  Vitals reviewed.   Data Reviewed ED note, aspiration right breast mass 2012  Assessment    Large left breast mass h/o fibrocystic disease     Plan    Breast center referral for mammogram and [place].        Mana Morison 04/17/2016, 1:35 PM

## 2016-04-17 NOTE — Patient Instructions (Signed)
PATIENT INSTRUCTIONS  FIBROCYSTIC BREAST DISEASE    FOLLOW-UP:  Call your physician should you develop a new breast mass that is different, if one particular lump starts to enlarge, or if nipple discharge develops.     CAUSE:  Many women have some lumpiness within their breasts and these areas at times can become tender during certain times in your menstrual cycle.  These areas tend to feel like a firm rubber ball as compared to a cancer which will more commonly feel hard and almost rock-like.  Fibrocystic breast disease does not in and of itself increase your risk for breast cancer but you should be sure to examine yiour breasts at the same time of the month on a monthly basis.  If there are a lot of areas of lumpiness you should tape a piece of paper on the mirror with a diagram of your breasts, noting where the areas of lumpiness are and their relative size.  You can then refer to this diagram on a monthly basis to keep better track of any changes should they occur.    DIET:  You should try and avoid foods, or at least minimize foods, such as chocolate and caffeine which may cause the symptoms of tenderness to become worse.    ACTIVITY:  You may want to wear a bra that offers additional support, and/or consider a sports bra, especially during those times when your breasts are more tender.    MEDICATIONS:  Taking Vitamin E capsules twice a day along with Evening of Primrose Capsules three times a day, or as directed on the bottle, may help your symptoms.  These are both available over-the-counter and without a prescription. There is clinical evidence that these may help symptoms in some patients.   If your physician has prescribed medication for your fibrocystic breast disease, be sure to take it as instructed on the bottle and let him/her know if you have any side effects.    QUESTIONS:  Please feel free to call your physician  if you have any questions, and they will be glad to assist you.

## 2016-04-17 NOTE — Progress Notes (Signed)
Pt was seen @ [facility] on 5/7 due to pain and lump in Lt breast x2 months. She has been unable to get appt for Mammogram due to not having a doctor.

## 2016-04-23 ENCOUNTER — Ambulatory Visit
Admission: RE | Admit: 2016-04-23 | Discharge: 2016-04-23 | Disposition: A | Discharge status: Home / Self Care | Payer: Medicare Other | Source: Ambulatory Visit | Attending: Obstetrics & Gynecology | Admitting: Obstetrics & Gynecology

## 2016-04-23 DIAGNOSIS — N63 Unspecified lump in breast: Secondary | ICD-10-CM | POA: Diagnosis not present

## 2016-04-23 DIAGNOSIS — N6320 Unspecified lump in the left breast, unspecified quadrant: Secondary | ICD-10-CM

## 2016-04-23 DIAGNOSIS — N6002 Solitary cyst of left breast: Secondary | ICD-10-CM | POA: Diagnosis not present

## 2016-04-23 DIAGNOSIS — N6019 Diffuse cystic mastopathy of unspecified breast: Secondary | ICD-10-CM

## 2016-05-03 DIAGNOSIS — N6002 Solitary cyst of left breast: Secondary | ICD-10-CM | POA: Diagnosis not present

## 2016-05-15 DIAGNOSIS — R922 Inconclusive mammogram: Secondary | ICD-10-CM | POA: Diagnosis not present

## 2016-05-28 DIAGNOSIS — N6002 Solitary cyst of left breast: Secondary | ICD-10-CM | POA: Diagnosis not present

## 2016-07-03 DIAGNOSIS — M25572 Pain in left ankle and joints of left foot: Secondary | ICD-10-CM | POA: Diagnosis not present

## 2016-07-03 DIAGNOSIS — M7541 Impingement syndrome of right shoulder: Secondary | ICD-10-CM | POA: Diagnosis not present

## 2016-07-26 DIAGNOSIS — M25572 Pain in left ankle and joints of left foot: Secondary | ICD-10-CM | POA: Diagnosis not present

## 2016-07-26 DIAGNOSIS — M722 Plantar fascial fibromatosis: Secondary | ICD-10-CM | POA: Diagnosis not present

## 2016-08-19 DIAGNOSIS — M25372 Other instability, left ankle: Secondary | ICD-10-CM | POA: Diagnosis not present

## 2016-08-22 ENCOUNTER — Ambulatory Visit (INDEPENDENT_AMBULATORY_CARE_PROVIDER_SITE_OTHER): Payer: Self-pay | Admitting: Orthopedic Surgery

## 2016-08-22 DIAGNOSIS — R922 Inconclusive mammogram: Secondary | ICD-10-CM | POA: Diagnosis not present

## 2016-08-29 ENCOUNTER — Ambulatory Visit (INDEPENDENT_AMBULATORY_CARE_PROVIDER_SITE_OTHER): Payer: Medicare Other | Admitting: Orthopedic Surgery

## 2016-08-29 DIAGNOSIS — M25572 Pain in left ankle and joints of left foot: Secondary | ICD-10-CM | POA: Diagnosis not present

## 2016-09-03 ENCOUNTER — Other Ambulatory Visit (INDEPENDENT_AMBULATORY_CARE_PROVIDER_SITE_OTHER): Payer: Self-pay | Admitting: Radiology

## 2016-09-03 DIAGNOSIS — R2689 Other abnormalities of gait and mobility: Secondary | ICD-10-CM

## 2016-09-03 DIAGNOSIS — R29898 Other symptoms and signs involving the musculoskeletal system: Secondary | ICD-10-CM

## 2016-09-05 ENCOUNTER — Telehealth (INDEPENDENT_AMBULATORY_CARE_PROVIDER_SITE_OTHER): Payer: Self-pay | Admitting: Radiology

## 2016-09-05 NOTE — Telephone Encounter (Signed)
Can you please call this patient. I have spoken with physical therapy staff, and insurance will not pay for the [facility] exam Dr. Sharol Given had requested. He was wanting this to see if she could medically qualify for home health aid. Insurance will pay for her to have the physical therapy so they can help her with her gait and balance loss, and strengthening of lower extremities. However she does not medically qualify for a home health aide, and there is no way without F[facility] to provide medical necessity for this need. CB# [phone]

## 2016-09-05 NOTE — Telephone Encounter (Signed)
Called and sw pt to advise of message below. Voiced understanding and will do the physical therapy.

## 2016-09-11 ENCOUNTER — Emergency Department (HOSPITAL_COMMUNITY): Payer: Medicare Other

## 2016-09-11 ENCOUNTER — Encounter (HOSPITAL_COMMUNITY): Payer: Self-pay | Admitting: Emergency Medicine

## 2016-09-11 ENCOUNTER — Emergency Department (HOSPITAL_COMMUNITY)
Admission: EM | Admit: 2016-09-11 | Discharge: 2016-09-11 | Disposition: A | Discharge status: Home / Self Care | Payer: Medicare Other | Attending: Emergency Medicine | Admitting: Emergency Medicine

## 2016-09-11 DIAGNOSIS — M25562 Pain in left knee: Secondary | ICD-10-CM | POA: Diagnosis not present

## 2016-09-11 DIAGNOSIS — S93402A Sprain of unspecified ligament of left ankle, initial encounter: Secondary | ICD-10-CM | POA: Diagnosis not present

## 2016-09-11 DIAGNOSIS — M79645 Pain in left finger(s): Secondary | ICD-10-CM | POA: Diagnosis not present

## 2016-09-11 DIAGNOSIS — Y929 Unspecified place or not applicable: Secondary | ICD-10-CM | POA: Diagnosis not present

## 2016-09-11 DIAGNOSIS — M25572 Pain in left ankle and joints of left foot: Secondary | ICD-10-CM | POA: Diagnosis not present

## 2016-09-11 DIAGNOSIS — Y999 Unspecified external cause status: Secondary | ICD-10-CM | POA: Diagnosis not present

## 2016-09-11 DIAGNOSIS — F17210 Nicotine dependence, cigarettes, uncomplicated: Secondary | ICD-10-CM | POA: Insufficient documentation

## 2016-09-11 DIAGNOSIS — W1839XA Other fall on same level, initial encounter: Secondary | ICD-10-CM | POA: Diagnosis not present

## 2016-09-11 DIAGNOSIS — S99912A Unspecified injury of left ankle, initial encounter: Secondary | ICD-10-CM | POA: Diagnosis present

## 2016-09-11 DIAGNOSIS — J45909 Unspecified asthma, uncomplicated: Secondary | ICD-10-CM | POA: Diagnosis not present

## 2016-09-11 DIAGNOSIS — Y939 Activity, unspecified: Secondary | ICD-10-CM | POA: Diagnosis not present

## 2016-09-11 DIAGNOSIS — S8992XA Unspecified injury of left lower leg, initial encounter: Secondary | ICD-10-CM | POA: Diagnosis not present

## 2016-09-11 DIAGNOSIS — S93409A Sprain of unspecified ligament of unspecified ankle, initial encounter: Secondary | ICD-10-CM

## 2016-09-11 MED ORDER — METHOCARBAMOL 500 MG PO TABS: 1000.0000 mg | ORAL_TABLET | Freq: Four times a day (QID) | ORAL | 0 refills | Status: DC | PRN

## 2016-09-11 MED ORDER — MORPHINE SULFATE (PF) 2 MG/ML IV SOLN
4.0000 mg | Freq: Once | INTRAVENOUS | Status: AC
  Administered 2016-09-11: 4 mg via INTRAMUSCULAR
  Filled 2016-09-11: qty 2

## 2016-09-11 NOTE — ED Notes (Signed)
Pt ambulated to the restroom without any difficulty with her crutches.

## 2016-09-11 NOTE — ED Notes (Signed)
Ortho en route.  

## 2016-09-11 NOTE — ED Notes (Signed)
Ortho paged about thumb spica.

## 2016-09-11 NOTE — ED Triage Notes (Signed)
Per pt, states she twisted left ankle on Sunday

## 2016-09-11 NOTE — Discharge Instructions (Signed)
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
## 2016-09-16 ENCOUNTER — Ambulatory Visit: Payer: Self-pay | Admitting: Surgery

## 2016-09-16 DIAGNOSIS — N6011 Diffuse cystic mastopathy of right breast: Secondary | ICD-10-CM | POA: Diagnosis not present

## 2016-09-16 DIAGNOSIS — N6012 Diffuse cystic mastopathy of left breast: Secondary | ICD-10-CM | POA: Diagnosis not present

## 2016-09-16 NOTE — H&P (Signed)
History of Present Illness [name]. Mackenzie Cohenour MD; 09/16/2016 10:51 AM) The patient is a 40 year old female who presents with a complaint of Breast problems. This is a 40 year old female with a history of bilateral breast cysts who presents with a palpable mass in her left breast. The patient has had previous aspirations of cysts on both sides in 2012. In 2014, a cyst was aspirated on her left side. She also underwent a lumpectomy on the right side for multiple cysts. None of these were performed here in [place]. In 2014, she also underwent hysterectomy but still retains her ovaries. Over the last 2 months she has noticed an enlargement of an area in the upper left outer quadrant that has become very tender. She underwent mammogram and ultrasound. Ultrasound showed a 2.5 cm cyst surrounded by some other smaller cysts. The entire palpable area measures about 6 cm. She reports occasional clear drainage from the nipple. She comes in today for surgical evaluation. She seems to be fairly frustrated by the recurrent cysts as well as the pain that is associated with them. She has difficulty with monthly self-examinations. No family history of breast cancer.  At the last visit, we discussed possible nipple sparing mastectomy. She has been seen in consultation by Dr. Iran Bentley. However the patient was smoking 2 packs of cigarettes per day. This disqualified her from being a candidate for reconstruction until she has been off cigarettes for several weeks. The patient has since stopped smoking completely. She was seen recently by Dr. Iran Bentley and has decided on bilateral NSM via inframammary incisions with immediate reconstruction.  CLINICAL DATA: Palpable abnormality in the left breast. Patient was recently seen in the emergency department with this complaint. History of aspiration of cysts and excision of right breast cysts.  EXAM: 2D DIGITAL DIAGNOSTIC BILATERAL MAMMOGRAM WITH CAD AND ADJUNCT  TOMO  ULTRASOUND LEFT BREAST  COMPARISON: 01/31/2011 and 01/12/2010  ACR Breast Density Category d: The breast tissue is extremely dense, which lowers the sensitivity of mammography.  FINDINGS: No suspicious mass, distortion, or microcalcifications are identified to suggest presence of malignancy.  Mammographic images were processed with CAD.  On physical exam, I palpate a rounded mobile mass in the 12-1 o'clock location of the left breast. Patient is tender in this location on physical exam.  Targeted ultrasound is performed, showing a cyst in the 12 o'clock location of the left breast 4 cm from the nipple which measures 2.5 x 1.6 by 1.8 cm. Internal debris is present. No solid components or internal vascularity identified. Other sub cm cysts are identified in the same quadrant. No suspicious solid mass or acoustic shadowing identified.  IMPRESSION: 1. No mammographic or ultrasound evidence for malignancy. 2. Palpable abnormality in the left breast is a cyst. We discussed the option of ultrasound-guided aspiration as needed for pain relief. Patient declines aspiration and requests excision.  RECOMMENDATION: 1. Screening mammogram in one year.(Code:[id number]) 2. Surgical consultation regarding left breast excision at the request of the patient. Appointment has been made for the patient with Dr. Georgette Bentley 6-23 @ 9:30 AM.  I have discussed the findings and recommendations with the patient. Results were also provided in writing at the conclusion of the visit. If applicable, a reminder letter will be sent to the patient regarding the next appointment.  BI-RADS CATEGORY 2: Benign.   Electronically Signed By: [name] M.D. On: 04/23/2016 11:27   Problem List/Past Medical [name] [name], [name]; 09/16/2016 10:32 AM) BENIGN BREAST CYST IN FEMALE, LEFT (N60.02)  Other Problems [name], [name]; 09/16/2016 10:32 AM) Asthma Lump In Breast Thyroid Disease  Past  Surgical History [name], [name]; 09/16/2016 10:32 AM) Foot Surgery Left. Hysterectomy (not due to cancer) - Partial Thyroid Surgery Tonsillectomy  Diagnostic Studies History [name], [name]; 09/16/2016 10:32 AM) Colonoscopy never  Allergies [name], [name]; 09/16/2016 10:32 AM) No Known Drug Allergies 05/03/2016  Medication History [name], [name]; 09/16/2016 10:35 AM) Vyvanse (20MG  Capsule, Oral) Active. TraZODone HCl (100MG  Tablet, Oral) Active. Cymbalta (20MG  Capsule DR Part, Oral) Active. Hydrocodone-Acetaminophen (5-325MG  Tablet, Oral) Active. Ibuprofen (800MG  Tablet, Oral) Active. Robaxin (500MG  Tablet, Oral) Active. Zofran (4MG  Tablet, Oral) Active. Roxicet (5-325MG  Tablet, Oral) Active. Desyrel (100MG  Tablet, Oral) Active. Medications Reconciled  Social History [name], [name]; 09/16/2016 10:32 AM) Alcohol use Occasional alcohol use. Tobacco use Current some day smoker. Caffeine use Coffee.  Family History [name], [name]; 09/16/2016 10:32 AM) Hypertension Mother. Depression Mother. Migraine Headache Mother.  Pregnancy / Birth History [name], [name]; 09/16/2016 10:32 AM) Maternal age 81-20 Para 1    Vitals ([place]; 09/16/2016 10:35 AM) 09/16/2016 10:35 AM Weight: 142.8 lb Height: 66.5in Body Surface Area: 1.74 m Body Mass Index: 22.7 kg/m  Temp.: 98.68F  Pulse: 11 (Regular)  BP: 132/80 (Sitting, Left Arm, Standard)      Physical Exam [name] K. Chaney Maclaren MD; 09/16/2016 10:52 AM)  The physical exam findings are as follows: Note:WDWN in NAD HEENT: EOMI, sclera anicteric Neck: No masses, no thyromegaly Lungs: CTA bilaterally; normal respiratory effort CV: Regular rate and rhythm; no murmurs Abd: +bowel sounds, soft, non-tender, no masses Ext: Well-perfused; no edema Skin: Warm, dry; no sign of jaundice Breasts - appear symmetric Well-healed right circumareolar incision. She has  palpable cysts scattered throughout the entire breast. None of these are tender. There is no drainage from the nipple. All of these areas seemed to be smooth and mobile. Left breast shows some fullness in the upper outer quadrant just outside the edge of the areola. No nipple drainage noted today. She has multiple cysts scattered throughout her breast. In the upper outer quadrant there is a 5 cm area of firmness that seems to consist of several masses that are all immediately adjacent. This is the area that was seen on ultrasound.    Assessment & Plan [name]. 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 Iran Bentley). The surgical procedure has been discussed with the patient. Potential risks, benefits, alternative treatments, and expected outcomes have been explained. All of the patient's questions at this time have been answered. The likelihood of reaching the patient's treatment goal is good. The patient understand the proposed surgical procedure and wishes to proceed.  [name]. Mackenzie Dover, MD, [facility]  General/ Trauma Surgery  09/16/2016 10:52 AM

## 2016-09-17 DIAGNOSIS — S63642A Sprain of metacarpophalangeal joint of left thumb, initial encounter: Secondary | ICD-10-CM | POA: Diagnosis not present

## 2016-09-18 ENCOUNTER — Ambulatory Visit: Payer: Medicare Other | Admitting: *Deleted

## 2016-09-23 ENCOUNTER — Ambulatory Visit: Payer: Medicare Other | Attending: Family | Admitting: Physical Therapy

## 2016-09-26 ENCOUNTER — Ambulatory Visit (INDEPENDENT_AMBULATORY_CARE_PROVIDER_SITE_OTHER): Payer: Medicare Other | Admitting: Orthopedic Surgery

## 2016-10-01 ENCOUNTER — Telehealth (INDEPENDENT_AMBULATORY_CARE_PROVIDER_SITE_OTHER): Payer: Self-pay

## 2016-10-01 NOTE — Telephone Encounter (Signed)
Called pt to follow up on missed appt last week. Offered to make follow up appt and pt states that she will not be coming back. I advised that if there was anything that we could do or if she needed anything at all to not hesitate to call the office we are happy to help.

## 2016-10-16 DIAGNOSIS — R922 Inconclusive mammogram: Secondary | ICD-10-CM | POA: Diagnosis not present

## 2016-10-16 NOTE — H&P (Signed)
Subjective:     Patient ID: [name] is a 40 y.o. female.  HPI   Here for follow up discussion breast reconstruction prior to planned bilateral NSM. She quit smoking 06/2016. Presented to [facility] ED [facility]/2016 with c/o breast mass, reported she could not get MMG as she had no PCP. Subsequest establishment to GYN and referral for MMG/US. MMG and [place] benign, palpable mass cyst, breast density D. She has had histroy fibrocystic disease and prior aspirations/resections. Patient desires bilateral mastectomies due to this history, difficult self exam. One maternal aunt breast ca. Reports multiple prior biopsies, benign.   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 Lives with adult daughter.  Current 36 B, desires more lifted. Wt- notes 25-30 lb wt gain post fusion then started Vynase - has lost this weight and stable since last visit.  Review of Systems    Objective:   Physical Exam  Cardiovascular: Normal rate, regular rhythm and normal heart sounds.   Pulmonary/Chest: Effort normal and breath sounds normal.   Nodular fibrocystic breasts Neck with some hypertophy scar R>L volume Grade 2 ptosis on right, grade 1 ptosis left   SN to nipple R 25 L 23 cm BW R 14.5 L 14 cm Nipple to IMF R  10 L  9 cm  Assessment:     Dense breasts Family history breast ca    Plan:     Patient with dense breasts, difficult clinical exam, multiple biopsies in past and desires bilateral mastectomies. She quit smoking in 06/2016, states was on nicotine patch but reports off currently. Counseled the she needs to refrain from all nicotine products as this causes vasoconstriction and can cas would healing problems, flap necrosis.  Plan bilateral NSM with tissue expander possible acellular dermis reconstruction.Discussed IMF scar.Reviewed use of ADM in breast reconstruction, cadaveric source, and can act as additional nidus for infection if experiences infection or seroma as will take weeks  to incorporate.  Reviewed incisions, drains, OR length, hospital stay and recovery. Discussed process of expansion and implant based risks including rupture, MRI surveillance for silicone implants, infection requiring surgery or removal, contracture. Discussed future surgery dependent on adjuvant treatments.  Reviewed post procedure limitations inc compression, drain care, prescriptions.  Reports does well with Percocet (10).   Reviewed reconstruction will be asensate. Reviewed risks mastectomy flap necrosis requiring additional surgery. Additional risks including but not limited to anesthesia, wound healing problems, damage to deeper structures, need for additional procedures, DVT/PE, cardiopulmonary complications, unacceptable cosmetic result, hematoma, seroma, fat necrosis reviewed.  Irene Limbo, MD [facility] [phone], pin [phone]

## 2016-10-22 ENCOUNTER — Encounter (HOSPITAL_COMMUNITY): Payer: Self-pay

## 2016-10-22 ENCOUNTER — Encounter (HOSPITAL_COMMUNITY)
Admission: RE | Admit: 2016-10-22 | Discharge: 2016-10-22 | Disposition: A | Discharge status: Home / Self Care | Payer: Medicare Other | Source: Ambulatory Visit | Attending: Surgery | Admitting: Surgery

## 2016-10-22 DIAGNOSIS — Z01812 Encounter for preprocedural laboratory examination: Secondary | ICD-10-CM | POA: Diagnosis not present

## 2016-10-22 DIAGNOSIS — N6019 Diffuse cystic mastopathy of unspecified breast: Secondary | ICD-10-CM | POA: Diagnosis not present

## 2016-10-22 HISTORY — DX: Other complications of anesthesia, initial encounter: T88.59XA

## 2016-10-22 HISTORY — DX: Unspecified osteoarthritis, unspecified site: M19.90

## 2016-10-22 HISTORY — DX: Adverse effect of unspecified anesthetic, initial encounter: T41.45XA

## 2016-10-22 LAB — BASIC METABOLIC PANEL
ANION GAP: 5 (ref 5–15)
BUN: 7 mg/dL (ref 6–20)
CALCIUM: 8.6 mg/dL — AB (ref 8.9–10.3)
CHLORIDE: 110 mmol/L (ref 101–111)
CO2: 27 mmol/L (ref 22–32)
Creatinine, Ser: 0.94 mg/dL (ref 0.44–1.00)
GFR calc non Af Amer: >60 mL/min (ref >60)
Glucose, Bld: 90 mg/dL (ref 65–99)
POTASSIUM: 4.1 mmol/L (ref 3.5–5.1)
Sodium: 142 mmol/L (ref 135–145)

## 2016-10-22 LAB — CBC
HCT: 39 % (ref 36.0–46.0)
Hemoglobin: 13.2 g/dL (ref 12.0–15.0)
MCH: 29.3 pg (ref 26.0–34.0)
MCHC: 33.8 g/dL (ref 30.0–36.0)
MCV: 86.5 fL (ref 78.0–100.0)
Platelets: 165 10*3/uL (ref 150–400)
RBC: 4.51 MIL/uL (ref 3.87–5.11)
RDW: 13.7 % (ref 11.5–15.5)
WBC: 2.9 10*3/uL — ABNORMAL LOW (ref 4.0–10.5)

## 2016-10-22 NOTE — Pre-Procedure Instructions (Addendum)
[name]  10/22/2016      [facility] - [place], [place] - [address] [place] [address] Phone: [phone] Fax: [phone]  [id number] - [place], [place] [address] Phone: [phone] Fax: [phone]    Your procedure is scheduled on 10/29/16.  Report to [facility] Admitting at 530 A.M.  Call this number if you have problems the morning of surgery:  [phone]   Remember:  Do not eat food or drink liquids after midnight.  Take these medicines the morning of surgery with A SIP OF WATER   Inhaler if needed,robaxin if needed, oxycodone if needed, pristiq,cymbalta  STOP TODAY   all herbel meds, nsaids (aleve,naproxen,advil,ibuprofen) aspirin,all vitamins, melatonin   Do not wear jewelry, make-up or nail polish.  Do not wear lotions, powders, or perfumes, or deoderant.  Do not shave 48 hours prior to surgery.  Men may shave face and neck.  Do not bring valuables to the hospital.  [facility] is not responsible for any belongings or valuables.  Contacts, dentures or bridgework may not be worn into surgery.  Leave your suitcase in the car.  After surgery it may be brought to your room.  For patients admitted to the hospital, discharge time will be determined by your treatment team.  Patients discharged the day of surgery will not be allowed to drive home.   Special instructions:  Special Instructions: [place] - Preparing for Surgery  Before surgery, you can play an important role.  Because skin is not sterile, your skin needs to be as free of germs as possible.  You can reduce the number of germs on you skin by washing with CHG (chlorahexidine gluconate) soap before surgery.  CHG is an antiseptic cleaner which kills germs and bonds with the skin to continue killing germs even after washing.  Please DO NOT use  if you have an allergy to CHG or antibacterial soaps.  If your skin becomes reddened/irritated stop using the CHG and inform your nurse when you arrive at Short Stay.  Do not shave (including legs and underarms) for at least 48 hours prior to the first CHG shower.  You may shave your face.  Please follow these instructions carefully:   1.  Shower with CHG Soap the night before surgery and the morning of Surgery.  2.  If you choose to wash your hair, wash your hair first as usual with your normal shampoo.  3.  After you shampoo, rinse your hair and body thoroughly to remove the Shampoo.  4.  Use CHG as you would any other liquid soap.  You can apply chg directly  to the skin and wash gently with scrungie or a clean washcloth.  5.  Apply the CHG Soap to your body ONLY FROM THE NECK DOWN.  Do not use on open wounds or open sores.  Avoid contact with your eyes ears, mouth and genitals (private parts).  Wash genitals (private parts)       with your normal soap.  6.  Wash thoroughly, paying special attention to the area where your surgery will be performed.  7.  Thoroughly rinse your body with warm water from the neck down.  8.  DO NOT shower/wash with your normal soap after using and rinsing off the CHG Soap.  9.  Pat yourself dry with a clean towel.  10.  Wear clean pajamas.            11.  Place clean sheets on your bed the night of your first shower and do not sleep with pets.  Day of Surgery  Do not apply any lotions/deodorants the morning of surgery.  Please wear clean clothes to the hospital/surgery center.  Please read over the  fact sheets that you were given.

## 2016-10-29 ENCOUNTER — Ambulatory Visit (HOSPITAL_COMMUNITY): Payer: Medicare Other | Admitting: Certified Registered"

## 2016-10-29 ENCOUNTER — Encounter (HOSPITAL_COMMUNITY)
Admission: RE | Disposition: A | Discharge status: Home / Self Care | Payer: Self-pay | Source: Ambulatory Visit | Attending: Surgery

## 2016-10-29 ENCOUNTER — Ambulatory Visit (HOSPITAL_COMMUNITY)
Admission: RE | Admit: 2016-10-29 | Discharge: 2016-10-30 | Disposition: A | Discharge status: Home / Self Care | Payer: Medicare Other | Source: Ambulatory Visit | Attending: Surgery | Admitting: Surgery

## 2016-10-29 ENCOUNTER — Encounter (HOSPITAL_COMMUNITY): Payer: Self-pay | Admitting: *Deleted

## 2016-10-29 DIAGNOSIS — N6011 Diffuse cystic mastopathy of right breast: Secondary | ICD-10-CM | POA: Diagnosis not present

## 2016-10-29 DIAGNOSIS — N6082 Other benign mammary dysplasias of left breast: Secondary | ICD-10-CM | POA: Insufficient documentation

## 2016-10-29 DIAGNOSIS — Z87891 Personal history of nicotine dependence: Secondary | ICD-10-CM | POA: Insufficient documentation

## 2016-10-29 DIAGNOSIS — Z91013 Allergy to seafood: Secondary | ICD-10-CM | POA: Insufficient documentation

## 2016-10-29 DIAGNOSIS — N6012 Diffuse cystic mastopathy of left breast: Secondary | ICD-10-CM | POA: Insufficient documentation

## 2016-10-29 DIAGNOSIS — Z803 Family history of malignant neoplasm of breast: Secondary | ICD-10-CM | POA: Diagnosis not present

## 2016-10-29 DIAGNOSIS — R921 Mammographic calcification found on diagnostic imaging of breast: Secondary | ICD-10-CM | POA: Diagnosis not present

## 2016-10-29 DIAGNOSIS — D242 Benign neoplasm of left breast: Secondary | ICD-10-CM | POA: Insufficient documentation

## 2016-10-29 DIAGNOSIS — D241 Benign neoplasm of right breast: Secondary | ICD-10-CM | POA: Insufficient documentation

## 2016-10-29 DIAGNOSIS — N6081 Other benign mammary dysplasias of right breast: Secondary | ICD-10-CM | POA: Insufficient documentation

## 2016-10-29 DIAGNOSIS — F329 Major depressive disorder, single episode, unspecified: Secondary | ICD-10-CM | POA: Diagnosis not present

## 2016-10-29 DIAGNOSIS — N644 Mastodynia: Secondary | ICD-10-CM | POA: Diagnosis not present

## 2016-10-29 DIAGNOSIS — N6002 Solitary cyst of left breast: Secondary | ICD-10-CM | POA: Insufficient documentation

## 2016-10-29 DIAGNOSIS — J45909 Unspecified asthma, uncomplicated: Secondary | ICD-10-CM | POA: Diagnosis not present

## 2016-10-29 DIAGNOSIS — N6001 Solitary cyst of right breast: Secondary | ICD-10-CM | POA: Diagnosis not present

## 2016-10-29 DIAGNOSIS — M1990 Unspecified osteoarthritis, unspecified site: Secondary | ICD-10-CM | POA: Diagnosis not present

## 2016-10-29 DIAGNOSIS — D649 Anemia, unspecified: Secondary | ICD-10-CM | POA: Diagnosis not present

## 2016-10-29 DIAGNOSIS — G8918 Other acute postprocedural pain: Secondary | ICD-10-CM | POA: Diagnosis not present

## 2016-10-29 HISTORY — PX: BREAST RECONSTRUCTION WITH PLACEMENT OF TISSUE EXPANDER AND FLEX HD (ACELLULAR HYDRATED DERMIS): SHX6295

## 2016-10-29 HISTORY — PX: NIPPLE SPARING MASTECTOMY: SHX6537

## 2016-10-29 SURGERY — MASTECTOMY, NIPPLE SPARING
Anesthesia: Regional | Site: Breast | Laterality: Bilateral

## 2016-10-29 MED ORDER — LIDOCAINE HCL (CARDIAC) 20 MG/ML IV SOLN
INTRAVENOUS | Status: DC | PRN
  Administered 2016-10-29: 50 mg via INTRAVENOUS

## 2016-10-29 MED ORDER — IBUPROFEN 600 MG PO TABS: 600.0000 mg | ORAL_TABLET | Freq: Four times a day (QID) | ORAL | Status: DC | PRN

## 2016-10-29 MED ORDER — PHENYLEPHRINE HCL 10 MG/ML IJ SOLN
INTRAVENOUS | Status: DC | PRN
  Administered 2016-10-29: 30 ug/min via INTRAVENOUS

## 2016-10-29 MED ORDER — OXYCODONE HCL 5 MG/5ML PO SOLN: 5.0000 mg | Freq: Once | ORAL | Status: DC | PRN

## 2016-10-29 MED ORDER — LACTATED RINGERS IV SOLN
INTRAVENOUS | Status: DC | PRN
  Administered 2016-10-29 (×3): via INTRAVENOUS

## 2016-10-29 MED ORDER — CEFAZOLIN IN D5W 1 GM/50ML IV SOLN
1.0000 g | Freq: Three times a day (TID) | INTRAVENOUS | Status: DC
  Administered 2016-10-29 – 2016-10-30 (×3): 1 g via INTRAVENOUS
  Filled 2016-10-29 (×4): qty 50

## 2016-10-29 MED ORDER — ONDANSETRON HCL 4 MG/2ML IJ SOLN
INTRAMUSCULAR | Status: AC
  Filled 2016-10-29: qty 2

## 2016-10-29 MED ORDER — FENTANYL CITRATE (PF) 100 MCG/2ML IJ SOLN
INTRAMUSCULAR | Status: AC
  Filled 2016-10-29: qty 2

## 2016-10-29 MED ORDER — FENTANYL CITRATE (PF) 100 MCG/2ML IJ SOLN: 25.0000 ug | INTRAMUSCULAR | Status: DC | PRN

## 2016-10-29 MED ORDER — OXYCODONE-ACETAMINOPHEN 5-325 MG PO TABS
1.0000 | ORAL_TABLET | ORAL | Status: DC | PRN
  Administered 2016-10-29 – 2016-10-30 (×3): 2 via ORAL
  Filled 2016-10-29 (×3): qty 2

## 2016-10-29 MED ORDER — BOOST / RESOURCE BREEZE PO LIQD
1.0000 | Freq: Three times a day (TID) | ORAL | Status: DC
  Administered 2016-10-29 (×2): 1 via ORAL

## 2016-10-29 MED ORDER — SUGAMMADEX SODIUM 200 MG/2ML IV SOLN
INTRAVENOUS | Status: DC | PRN
  Administered 2016-10-29: 200 mg via INTRAVENOUS

## 2016-10-29 MED ORDER — PHENYLEPHRINE HCL 10 MG/ML IJ SOLN
INTRAMUSCULAR | Status: DC | PRN
  Administered 2016-10-29 (×2): 80 ug via INTRAVENOUS

## 2016-10-29 MED ORDER — SODIUM CHLORIDE 0.9 % IV SOLN
INTRAVENOUS | Status: DC | PRN
  Administered 2016-10-29: 1000 mL

## 2016-10-29 MED ORDER — LISDEXAMFETAMINE DIMESYLATE 20 MG PO CAPS
40.0000 mg | ORAL_CAPSULE | Freq: Every day | ORAL | Status: DC
  Administered 2016-10-30: 40 mg via ORAL
  Filled 2016-10-29: qty 2

## 2016-10-29 MED ORDER — ROPIVACAINE HCL 7.5 MG/ML IJ SOLN
INTRAMUSCULAR | Status: DC | PRN
  Administered 2016-10-29 (×2): 10 mL via PERINEURAL

## 2016-10-29 MED ORDER — SODIUM CHLORIDE 0.9 % IR SOLN
Status: DC | PRN
  Administered 2016-10-29: 1000 mL

## 2016-10-29 MED ORDER — DIPHENHYDRAMINE HCL 25 MG PO CAPS: 25.0000 mg | ORAL_CAPSULE | Freq: Four times a day (QID) | ORAL | Status: DC | PRN

## 2016-10-29 MED ORDER — LISDEXAMFETAMINE DIMESYLATE 20 MG PO CAPS: 40.0000 mg | ORAL_CAPSULE | Freq: Every day | ORAL | Status: DC

## 2016-10-29 MED ORDER — ROCURONIUM BROMIDE 50 MG/5ML IV SOSY
PREFILLED_SYRINGE | INTRAVENOUS | Status: AC
  Filled 2016-10-29: qty 10

## 2016-10-29 MED ORDER — MIDAZOLAM HCL 2 MG/2ML IJ SOLN
INTRAMUSCULAR | Status: AC
  Filled 2016-10-29: qty 2

## 2016-10-29 MED ORDER — ROCURONIUM BROMIDE 50 MG/5ML IV SOSY
PREFILLED_SYRINGE | INTRAVENOUS | Status: AC
  Filled 2016-10-29: qty 5

## 2016-10-29 MED ORDER — CHLORHEXIDINE GLUCONATE CLOTH 2 % EX PADS: 6.0000 | MEDICATED_PAD | Freq: Once | CUTANEOUS | Status: DC

## 2016-10-29 MED ORDER — SODIUM CHLORIDE 0.9 % IV SOLN
INTRAVENOUS | Status: DC
  Administered 2016-10-29: 18:00:00 via INTRAVENOUS

## 2016-10-29 MED ORDER — SODIUM CHLORIDE 0.9 % IV SOLN
INTRAVENOUS | Status: DC
  Filled 2016-10-29: qty 1

## 2016-10-29 MED ORDER — BUPIVACAINE-EPINEPHRINE (PF) 0.5% -1:200000 IJ SOLN
INTRAMUSCULAR | Status: DC | PRN
  Administered 2016-10-29 (×2): 15 mL via PERINEURAL

## 2016-10-29 MED ORDER — ONDANSETRON HCL 4 MG/2ML IJ SOLN: 4.0000 mg | Freq: Four times a day (QID) | INTRAMUSCULAR | Status: DC | PRN

## 2016-10-29 MED ORDER — LIDOCAINE 2% (20 MG/ML) 5 ML SYRINGE
INTRAMUSCULAR | Status: AC
Start: 2016-10-29 — End: 2016-10-29
  Filled 2016-10-29: qty 5

## 2016-10-29 MED ORDER — FENTANYL CITRATE (PF) 100 MCG/2ML IJ SOLN
INTRAMUSCULAR | Status: DC | PRN
  Administered 2016-10-29 (×2): 25 ug via INTRAVENOUS
  Administered 2016-10-29: 100 ug via INTRAVENOUS

## 2016-10-29 MED ORDER — INFLUENZA VAC SPLIT QUAD 0.5 ML IM SUSY: 0.5000 mL | PREFILLED_SYRINGE | INTRAMUSCULAR | Status: DC

## 2016-10-29 MED ORDER — ONDANSETRON HCL 4 MG/2ML IJ SOLN
INTRAMUSCULAR | Status: DC | PRN
  Administered 2016-10-29: 4 mg via INTRAVENOUS

## 2016-10-29 MED ORDER — MIDAZOLAM HCL 5 MG/5ML IJ SOLN
INTRAMUSCULAR | Status: DC | PRN
  Administered 2016-10-29: 2 mg via INTRAVENOUS

## 2016-10-29 MED ORDER — TRAZODONE HCL 100 MG PO TABS
100.0000 mg | ORAL_TABLET | Freq: Every day | ORAL | Status: DC
  Administered 2016-10-29: 200 mg via ORAL
  Filled 2016-10-29: qty 2

## 2016-10-29 MED ORDER — ENOXAPARIN SODIUM 40 MG/0.4ML ~~LOC~~ SOLN
40.0000 mg | SUBCUTANEOUS | Status: DC
  Administered 2016-10-30: 40 mg via SUBCUTANEOUS
  Filled 2016-10-29: qty 0.4

## 2016-10-29 MED ORDER — PROPOFOL 10 MG/ML IV BOLUS
INTRAVENOUS | Status: AC
  Filled 2016-10-29: qty 20

## 2016-10-29 MED ORDER — HYDROMORPHONE HCL 2 MG/ML IJ SOLN: 1.0000 mg | INTRAMUSCULAR | Status: DC | PRN

## 2016-10-29 MED ORDER — METHOCARBAMOL 500 MG PO TABS: 500.0000 mg | ORAL_TABLET | Freq: Three times a day (TID) | ORAL | Status: DC | PRN

## 2016-10-29 MED ORDER — QUETIAPINE FUMARATE 50 MG PO TABS
75.0000 mg | ORAL_TABLET | Freq: Every day | ORAL | Status: DC
  Administered 2016-10-29: 75 mg via ORAL
  Filled 2016-10-29 (×2): qty 2

## 2016-10-29 MED ORDER — CEFAZOLIN SODIUM-DEXTROSE 2-4 GM/100ML-% IV SOLN
2.0000 g | INTRAVENOUS | Status: AC
  Administered 2016-10-29 (×2): 2 g via INTRAVENOUS
  Filled 2016-10-29: qty 100

## 2016-10-29 MED ORDER — ROCURONIUM BROMIDE 100 MG/10ML IV SOLN
INTRAVENOUS | Status: DC | PRN
  Administered 2016-10-29: 20 mg via INTRAVENOUS
  Administered 2016-10-29 (×2): 50 mg via INTRAVENOUS

## 2016-10-29 MED ORDER — INDOCYANINE GREEN 25 MG IV SOLR
INTRAVENOUS | Status: DC | PRN
  Administered 2016-10-29: 2.5 mg via INTRAVENOUS

## 2016-10-29 MED ORDER — OXYCODONE HCL 5 MG PO TABS: 5.0000 mg | ORAL_TABLET | Freq: Once | ORAL | Status: DC | PRN

## 2016-10-29 MED ORDER — DULOXETINE HCL 60 MG PO CPEP
60.0000 mg | ORAL_CAPSULE | Freq: Every day | ORAL | Status: DC
  Administered 2016-10-30: 60 mg via ORAL
  Filled 2016-10-29: qty 1

## 2016-10-29 MED ORDER — ALBUTEROL SULFATE (2.5 MG/3ML) 0.083% IN NEBU: 3.0000 mL | INHALATION_SOLUTION | Freq: Four times a day (QID) | RESPIRATORY_TRACT | Status: DC | PRN

## 2016-10-29 MED ORDER — DIPHENHYDRAMINE HCL 50 MG/ML IJ SOLN: 25.0000 mg | Freq: Four times a day (QID) | INTRAMUSCULAR | Status: DC | PRN

## 2016-10-29 MED ORDER — PROPOFOL 10 MG/ML IV BOLUS
INTRAVENOUS | Status: DC | PRN
  Administered 2016-10-29: 130 mg via INTRAVENOUS

## 2016-10-29 MED ORDER — INDOCYANINE GREEN 25 MG IV SOLR
1.2500 mg | INTRAVENOUS | Status: DC
  Filled 2016-10-29: qty 25

## 2016-10-29 MED ORDER — METHOCARBAMOL 500 MG PO TABS
1000.0000 mg | ORAL_TABLET | Freq: Four times a day (QID) | ORAL | Status: DC | PRN
  Administered 2016-10-29 – 2016-10-30 (×2): 1000 mg via ORAL
  Filled 2016-10-29 (×2): qty 2

## 2016-10-29 MED ORDER — ONDANSETRON 4 MG PO TBDP: 4.0000 mg | ORAL_TABLET | Freq: Four times a day (QID) | ORAL | Status: DC | PRN

## 2016-10-29 MED ORDER — 0.9 % SODIUM CHLORIDE (POUR BTL) OPTIME
TOPICAL | Status: DC | PRN
  Administered 2016-10-29 (×3): 1000 mL

## 2016-10-29 MED ORDER — VENLAFAXINE HCL ER 75 MG PO CP24
75.0000 mg | ORAL_CAPSULE | Freq: Every day | ORAL | Status: DC
  Administered 2016-10-30: 75 mg via ORAL
  Filled 2016-10-29 (×2): qty 1

## 2016-10-29 SURGICAL SUPPLY — 84 items
ADH SKN CLS APL DERMABOND .7 (GAUZE/BANDAGES/DRESSINGS) ×1
ALLOGRAFT DERM CORTIV 4CMX12CM (Tissue Mesh) ×1 IMPLANT
ALLOGRAFT DERM CORTIV 4CMX16CM (Tissue Mesh) ×1 IMPLANT
ALLOGRAFT DERM CORTIV 4X12 (Tissue Mesh) ×1 IMPLANT
ALLOGRAFT DERM CORTIV 4X16 (Tissue Mesh) ×1 IMPLANT
BAG DECANTER FOR FLEXI CONT (MISCELLANEOUS) ×3 IMPLANT
BINDER BREAST LRG (GAUZE/BANDAGES/DRESSINGS) ×3 IMPLANT
BINDER BREAST XLRG (GAUZE/BANDAGES/DRESSINGS) ×1 IMPLANT
BLADE PHOTON ILLUMINATED (MISCELLANEOUS) ×2 IMPLANT
CANISTER SUCTION 2500CC (MISCELLANEOUS) ×6 IMPLANT
CHLORAPREP W/TINT 26ML (MISCELLANEOUS) ×6 IMPLANT
CLOSURE STERI-STRIP 1/2X4 (GAUZE/BANDAGES/DRESSINGS)
CLSR STERI-STRIP ANTIMIC 1/2X4 (GAUZE/BANDAGES/DRESSINGS) ×1 IMPLANT
COVER SURGICAL LIGHT HANDLE (MISCELLANEOUS) ×4 IMPLANT
DERMABOND ADVANCED (GAUZE/BANDAGES/DRESSINGS) ×2
DERMABOND ADVANCED .7 DNX12 (GAUZE/BANDAGES/DRESSINGS) ×2 IMPLANT
DRAIN CHANNEL 15F RND FF W/TCR (WOUND CARE) IMPLANT
DRAIN CHANNEL 19F RND (DRAIN) ×5 IMPLANT
DRAPE INCISE IOBAN 66X45 STRL (DRAPES) IMPLANT
DRAPE LAPAROSCOPIC ABDOMINAL (DRAPES) ×1 IMPLANT
DRAPE ORTHO SPLIT 77X108 STRL (DRAPES) ×6
DRAPE PROXIMA HALF (DRAPES) ×4 IMPLANT
DRAPE SURG ORHT 6 SPLT 77X108 (DRAPES) ×2 IMPLANT
DRAPE WARM FLUID 44X44 (DRAPE) ×3 IMPLANT
DRSG PAD ABDOMINAL 8X10 ST (GAUZE/BANDAGES/DRESSINGS) ×4 IMPLANT
DRSG TEGADERM 2-3/8X2-3/4 SM (GAUZE/BANDAGES/DRESSINGS) ×1 IMPLANT
DRSG TEGADERM 4X4.75 (GAUZE/BANDAGES/DRESSINGS) ×16 IMPLANT
ELECT BLADE 4.0 EZ CLEAN MEGAD (MISCELLANEOUS) ×3
ELECT CAUTERY BLADE 6.4 (BLADE) ×4 IMPLANT
ELECT COATED BLADE 2.86 ST (ELECTRODE) ×1 IMPLANT
ELECT REM PT RETURN 9FT ADLT (ELECTROSURGICAL) ×3
ELECTRODE BLDE 4.0 EZ CLN MEGD (MISCELLANEOUS) ×2 IMPLANT
ELECTRODE REM PT RTRN 9FT ADLT (ELECTROSURGICAL) ×2 IMPLANT
EVACUATOR SILICONE 100CC (DRAIN) ×5 IMPLANT
EXPANDER TISSUE MX 300CC (Breast) IMPLANT
GAUZE SPONGE 4X4 12PLY STRL (GAUZE/BANDAGES/DRESSINGS) ×1 IMPLANT
GLOVE BIO SURGEON STRL SZ 6 (GLOVE) ×11 IMPLANT
GLOVE BIO SURGEON STRL SZ7 (GLOVE) ×4 IMPLANT
GLOVE BIOGEL PI IND STRL 6.5 (GLOVE) IMPLANT
GLOVE BIOGEL PI IND STRL 7.0 (GLOVE) IMPLANT
GLOVE BIOGEL PI IND STRL 7.5 (GLOVE) IMPLANT
GLOVE BIOGEL PI INDICATOR 6.5 (GLOVE) ×6
GLOVE BIOGEL PI INDICATOR 7.0 (GLOVE) ×2
GLOVE BIOGEL PI INDICATOR 7.5 (GLOVE) ×2
GLOVE SURG SS PI 6.0 STRL IVOR (GLOVE) ×4 IMPLANT
GLOVE SURG SS PI 6.5 STRL IVOR (GLOVE) ×6 IMPLANT
GOWN STRL REUS W/ TWL LRG LVL3 (GOWN DISPOSABLE) ×4 IMPLANT
GOWN STRL REUS W/TWL LRG LVL3 (GOWN DISPOSABLE) ×18
KIT BASIN OR (CUSTOM PROCEDURE TRAY) ×4 IMPLANT
KIT ROOM TURNOVER OR (KITS) ×4 IMPLANT
MARKER SKIN DUAL TIP RULER LAB (MISCELLANEOUS) ×3 IMPLANT
NS IRRIG 1000ML POUR BTL (IV SOLUTION) ×6 IMPLANT
PACK GENERAL/GYN (CUSTOM PROCEDURE TRAY) ×4 IMPLANT
PAD ARMBOARD 7.5X6 YLW CONV (MISCELLANEOUS) ×4 IMPLANT
PIN SAFETY STERILE (MISCELLANEOUS) ×3 IMPLANT
SET ASEPTIC TRANSFER (MISCELLANEOUS) ×3 IMPLANT
SOLUTION BETADINE 4OZ (MISCELLANEOUS) ×1 IMPLANT
SPECIMEN JAR X LARGE (MISCELLANEOUS) ×5 IMPLANT
SPONGE GAUZE 4X4 12PLY STER LF (GAUZE/BANDAGES/DRESSINGS) ×1 IMPLANT
SPONGE LAP 18X18 X RAY DECT (DISPOSABLE) ×2 IMPLANT
STAPLER VISISTAT 35W (STAPLE) ×3 IMPLANT
SUT ETHILON 2 0 FS 18 (SUTURE) ×4 IMPLANT
SUT ETHILON 3 0 FSL (SUTURE) ×1 IMPLANT
SUT MNCRL 3 0 RB1 (SUTURE) IMPLANT
SUT MNCRL AB 4-0 PS2 18 (SUTURE) ×4 IMPLANT
SUT MON AB 5-0 PS2 18 (SUTURE) IMPLANT
SUT MONOCRYL 3 0 RB1 (SUTURE)
SUT SILK 2 0 SH (SUTURE) ×4 IMPLANT
SUT VIC AB 0 CT1 27 (SUTURE) ×6
SUT VIC AB 0 CT1 27XBRD ANBCTR (SUTURE) IMPLANT
SUT VIC AB 3-0 SH 18 (SUTURE) ×1 IMPLANT
SUT VIC AB 3-0 SH 27 (SUTURE) ×15
SUT VIC AB 3-0 SH 27X BRD (SUTURE) IMPLANT
SUT VIC AB 4-0 PS2 27 (SUTURE) ×4 IMPLANT
SUT VICRYL 3 0 (SUTURE) IMPLANT
SUT VICRYL 4-0 PS2 18IN ABS (SUTURE) IMPLANT
SYR BULB IRRIGATION 50ML (SYRINGE) ×3 IMPLANT
TAPE STRIPS DRAPE STRL (GAUZE/BANDAGES/DRESSINGS) ×3 IMPLANT
TISSUE EXPANDER MX 300CC (Breast) ×6 IMPLANT
TOWEL OR 17X24 6PK STRL BLUE (TOWEL DISPOSABLE) ×6 IMPLANT
TOWEL OR 17X26 10 PK STRL BLUE (TOWEL DISPOSABLE) ×6 IMPLANT
TRAY FOLEY CATH 16FRSI W/METER (SET/KITS/TRAYS/PACK) IMPLANT
TUBE CONNECTING 12'X1/4 (SUCTIONS) ×1
TUBE CONNECTING 12X1/4 (SUCTIONS) ×2 IMPLANT

## 2016-10-29 NOTE — H&P (Signed)
History of Present Illness  The patient is a 40 year old female who presents with a complaint of Breast problems. This is a 40 year old female with a history of bilateral breast cysts who presents with a palpable mass in her left breast. The patient has had previous aspirations of cysts on both sides in 2012. In 2014, a cyst was aspirated on her left side. She also underwent a lumpectomy on the right side for multiple cysts. None of these were performed here in [place]. In 2014, she also underwent hysterectomy but still retains her ovaries. Over the last 2 months she has noticed an enlargement of an area in the upper left outer quadrant that has become very tender. She underwent mammogram and ultrasound. Ultrasound showed a 2.5 cm cyst surrounded by some other smaller cysts. The entire palpable area measures about 6 cm. She reports occasional clear drainage from the nipple. She comes in today for surgical evaluation. She seems to be fairly frustrated by the recurrent cysts as well as the pain that is associated with them. She has difficulty with monthly self-examinations. No family history of breast cancer.  At the last visit, we discussed possible nipple sparing mastectomy. She has been seen in consultation by Dr. Iran Planas. However the patient was smoking 2 packs of cigarettes per day. This disqualified her from being a candidate for reconstruction until she has been off cigarettes for several weeks. The patient has since stopped smoking completely. She was seen recently by Dr. Iran Planas and has decided on bilateral NSM via inframammary incisions with immediate reconstruction.  CLINICAL DATA: Palpable abnormality in the left breast. Patient was recently seen in the emergency department with this complaint. History of aspiration of cysts and excision of right breast cysts.  EXAM: 2D DIGITAL DIAGNOSTIC BILATERAL MAMMOGRAM WITH CAD AND ADJUNCT TOMO  ULTRASOUND LEFT  BREAST  COMPARISON: 01/31/2011 and 01/12/2010  ACR Breast Density Category d: The breast tissue is extremely dense, which lowers the sensitivity of mammography.  FINDINGS: No suspicious mass, distortion, or microcalcifications are identified to suggest presence of malignancy.  Mammographic images were processed with CAD.  On physical exam, I palpate a rounded mobile mass in the 12-1 o'clock location of the left breast. Patient is tender in this location on physical exam.  Targeted ultrasound is performed, showing a cyst in the 12 o'clock location of the left breast 4 cm from the nipple which measures 2.5 x 1.6 by 1.8 cm. Internal debris is present. No solid components or internal vascularity identified. Other sub cm cysts are identified in the same quadrant. No suspicious solid mass or acoustic shadowing identified.  IMPRESSION: 1. No mammographic or ultrasound evidence for malignancy. 2. Palpable abnormality in the left breast is a cyst. We discussed the option of ultrasound-guided aspiration as needed for pain relief. Patient declines aspiration and requests excision.  RECOMMENDATION: 1. Screening mammogram in one year.(Code:[id number]) 2. Surgical consultation regarding left breast excision at the request of the patient. Appointment has been made for the patient with Dr. Georgette Dover 6-23 @ 9:30 AM.  I have discussed the findings and recommendations with the patient. Results were also provided in writing at the conclusion of the visit. If applicable, a reminder letter will be sent to the patient regarding the next appointment.  BI-RADS CATEGORY 2: Benign.   Electronically Signed By: Nolon Nations M.D. On: 04/23/2016 11:27   Problem List/Past Medical  BENIGN BREAST CYST IN FEMALE, LEFT (N60.02)  Other Problems  Asthma Lump In Breast Thyroid Disease  Past  Surgical History  Foot Surgery Left. Hysterectomy (not due to cancer) -  Partial Thyroid Surgery Tonsillectomy  Diagnostic Studies History Colonoscopy never  Allergies  No Known Drug Allergies 05/03/2016  Medication History  Vyvanse (20MG  Capsule, Oral) Active. TraZODone HCl (100MG  Tablet, Oral) Active. Cymbalta (20MG  Capsule DR Part, Oral) Active. Hydrocodone-Acetaminophen (5-325MG  Tablet, Oral) Active. Ibuprofen (800MG  Tablet, Oral) Active. Robaxin (500MG  Tablet, Oral) Active. Zofran (4MG  Tablet, Oral) Active. Roxicet (5-325MG  Tablet, Oral) Active. Desyrel (100MG  Tablet, Oral) Active. Medications Reconciled  Social History  Alcohol use Occasional alcohol use. Tobacco use Current some day smoker. Caffeine use Coffee.  Family History  Hypertension Mother. Depression Mother. Migraine Headache Mother.  Pregnancy / Birth History  Maternal age 75-20 Para 1    Vitals  Weight: 142.8 lb Height: 66.5in Body Surface Area: 1.74 m Body Mass Index: 22.7 kg/m  Temp.: 98.21F  Pulse: 11 (Regular)  BP: 132/80 (Sitting, Left Arm, Standard)      Physical Exam  The physical exam findings are as follows: Note:WDWN in NAD HEENT: EOMI, sclera anicteric Neck: No masses, no thyromegaly Lungs: CTA bilaterally; normal respiratory effort CV: Regular rate and rhythm; no murmurs Abd: +bowel sounds, soft, non-tender, no masses Ext: Well-perfused; no edema Skin: Warm, dry; no sign of jaundice Breasts - appear symmetric Well-healed right circumareolar incision. She has palpable cysts scattered throughout the entire breast. None of these are tender. There is no drainage from the nipple. All of these areas seemed to be smooth and mobile. Left breast shows some fullness in the upper outer quadrant just outside the edge of the areola. No nipple drainage noted today. She has multiple cysts scattered throughout her breast. In the upper outer quadrant there is a 5 cm area of firmness that seems to consist  of several masses that are all immediately adjacent. This is the area that was seen on ultrasound.    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 Iran Planas). The surgical procedure has been discussed with the patient. Potential risks, benefits, alternative treatments, and expected outcomes have been explained. All of the patient's questions at this time have been answered. The likelihood of reaching the patient's treatment goal is good. The patient understand the proposed surgical procedure and wishes to proceed.  Imogene Burn. Georgette Dover, MD, [facility]  General/ Trauma Surgery  10/29/2016 7:01 AM

## 2016-10-29 NOTE — Transfer of Care (Signed)
Immediate Anesthesia Transfer of Care Note  Patient: Deloris Bierl  Procedure(s) Performed: Procedure(s): BILATERAL NIPPLE SPARING MASTECTOMIES (Bilateral) BILATERAL BREAST RECONSTRUCTION WITH PLACEMENT OF TISSUE EXPANDER AND  CORTIVA (Bilateral)  Patient Location: PACU  Anesthesia Type:General  Level of Consciousness: awake, alert  and oriented  Airway & Oxygen Therapy: Patient Spontanous Breathing  Post-op Assessment: Report given to RN and Post -op Vital signs reviewed and stable  Post vital signs: Reviewed and stable  Last Vitals:  Vitals:   10/29/16 0550  BP: 117/76  Pulse: 93  Resp: 20  Temp: 36.8 C    Last Pain:  Vitals:   10/29/16 0550  TempSrc: Oral      Patients Stated Pain Goal: 2 ([id number])  Complications: No apparent anesthesia complications

## 2016-10-29 NOTE — Anesthesia Postprocedure Evaluation (Addendum)
Anesthesia Post Note  Patient: Clela Tays  Procedure(s) Performed: Procedure(s) (LRB): BILATERAL NIPPLE SPARING MASTECTOMIES (Bilateral) BILATERAL BREAST RECONSTRUCTION WITH PLACEMENT OF TISSUE EXPANDER AND  CORTIVA (Bilateral)  Patient location during evaluation: PACU Anesthesia Type: General and Regional Level of consciousness: awake and alert Pain management: pain level controlled Vital Signs Assessment: post-procedure vital signs reviewed and stable Respiratory status: spontaneous breathing, nonlabored ventilation, respiratory function stable and patient connected to nasal cannula oxygen Cardiovascular status: stable Postop Assessment: no signs of nausea or vomiting Anesthetic complications: no       Last Vitals:  Vitals:   10/29/16 1340 10/29/16 1400  BP:  108/73  Pulse: (!) 102 (!) 107  Resp: 12   Temp:  36.9 C    Last Pain:  Vitals:   10/29/16 1532  TempSrc:   PainSc: 2                  Ameila Weldon

## 2016-10-29 NOTE — Op Note (Signed)
Preop diagnosis:  Dense breasts; difficult clinical examination; multiple previous biopsies, painful cysts Post-op diagnosis:  Same Procedure:  Bilateral nipple sparing mastectomies Surgeon:Vinaya Sancho K. Assistant: Dr. Iran Planas Anesthesia: General/ pec block Indications:  This is a 40 year old female with a history of multiple bilateral breast cysts that have been very painful.  She has undergone multiple cyst aspirati She has very difficult time with breast self-examinations.  Mammogram always describes the density of her breast tissue.  She has had occasional nipple drainage.  After extensive discussion, she has requested bilateral nipple sparing mastectomy with immediate reconstruction.    Description of procedure: The patient is brought to the operating room and placed in the supine position on the operating room table.  After an adequate level general anesthesia was obtained, her entire chest was prepped with ChloraPrep and draped in sterile fashion.  A timeout was taken to ensure the proper patient and proper procedure.  She had previously been marked by plastic surgery.  We began on her left side. I marked her inframammary crease as well as the edges of her breast tissue.  I made a transverse inframammary incision that was located 8 cm off the midline extending for a length of 7 cm.  This is 10 cm below the center of the nipple.  Using the photon blade, we dissected down to the chest wall.  We then began dissecting the posterior surface of the breast off of the underlying pectoralis muscle.  We extended our dissection laterally.  We then used the lighted retractor as we dissected superiorly.  We dissected the entire breast off of the underlying pectoralis.  Once we had reached the previously marked margins of the breast we began our anterior dissection.  We dissected the breast tissue off the posterior surface of the dermis, leaving a very thin layer breast tissue.  We dissected superiorly until  we were above the nipple.  We then dissected laterally to the edge of the breast.  We then extended our dissection superiorly and laterally towards the axilla.  We then completed our dissection off of the posterior surface of the upper skin flap until we reached the medial border of the breast.  The specimen was oriented with sutures - long lateral, short superior, single stitch at nipple.  We irrigated thoroughly and inspected for hemostasis.  The skin flaps appeared viable.  We packed a moist sponge into the space and turned our attention to the right side.  On the right side, I marked her inframammary crease as well as the edges of her breast tissue.  I made a transverse inframammary incision that was located 8 cm off the midline extending for a length of 7 cm.  This is 10 cm below the center of the nipple.  Using the photon blade, we dissected down to the chest wall.  We then began dissecting the posterior surface of the breast off of the underlying pectoralis muscle.  We extended our dissection laterally.  We then used the lighted retractor as we dissected superiorly.  We dissected the entire breast off of the underlying pectoralis.  Once we had reached the previously marked margins of the breast we began our anterior dissection.  We dissected the breast tissue off the posterior surface of the dermis, leaving a very thin layer breast tissue.  We dissected superiorly until we were above the nipple.  We then dissected laterally to the edge of the breast.  We then extended our dissection superiorly and laterally towards the axilla.  We then completed our dissection off of the posterior surface of the upper skin flap until we reached the medial border of the breast.  The specimen was oriented with sutures - long lateral, short superior, single stitch at nipple.  We irrigated thoroughly and inspected for hemostasis.  The skin flaps appeared viable.   At this point, I turned the procedure over to Dr. Iran Planas  for reconstruction.   Imogene Burn. Georgette Dover, MD, [facility]  General/ Trauma Surgery  10/29/2016 5:17 PM

## 2016-10-29 NOTE — Op Note (Signed)
Operative Note   DATE OF OPERATION: 12.19.17  LOCATION: [place] [facility] OR-observation  SURGICAL DIVISION: Plastic Surgery  PREOPERATIVE DIAGNOSES:  Dense breasts  POSTOPERATIVE DIAGNOSES:  same  PROCEDURE:  1. Bilateral breast reconstruction with tissue expanders 2. Acellular dermis (Cortiva) for breast reconstruction 110 cm2   SURGEON: Irene Limbo MD MBA  ASSISTANT: none  ANESTHESIA:  General.   EBL: 75 ml for entire case  COMPLICATIONS: None immediate.   INDICATIONS FOR PROCEDURE:  The patient, [name], is a 40 y.o. female born on 04-16-1976, is here for immediate breast reconstruction following nipple sparing mastectomies. Patient has dense breasts with multiple cysts that makes clinical and radiologic surveillance of patient difficult. She desires bilateral mastectomies.   FINDINGS: Right mastectomy 453 g Left 346 g. Natrelle [id number]-11-T 300 ml tissue expanders placed bilateral, RIGHT SN [id number] LEFT SN [id number]  DESCRIPTION OF PROCEDURE:  The patient was marked with the patient in the preoperative area to mark sternal notch, chest midline, anterior axillary lines and inframammary folds. The patient was taken to the operating room. SCDs were placed and IV antibiotics were given. The patient's operative site was prepped and draped in a sterile fashion. A time out was performed and all information was confirmed to be correct. Following completion of mastectomies, indocyanine green dye administered by anesthesia. Invuity Photon Vue used to assess mastectomy flaps with no significant areas of ischemia noted. Reconstruction began on right side. The inferior insertions of pectoralis major muscle were elevated continuous with abdominal wall fascia. Submuscular dissection completed toward clavicle. The anterior rectus fascia was elevated 1-2 cm below inframammary fold.Acellular dermiswas perforated and sewn to inferior border of pectoralis major with running 3-0 vicryl. A 19 Fr drain  was placed and secured to skin with 2-0 nylon. The cavity was irrigated with solution containing Ancef, gentamicin, and bacitracin. Hemostasis was ensured. Cavity irrigated with Betadine. The tissue expander was prepared and placed in submuscular position. The expander was secured to chest wall with a 3-0 vicryl. The inferior border of the acellular dermis was inset to chest wall and inframammary fold fascia . Laterally the acellular dermis was draped over the lateral border of expander and secured to serratus fascia. 0- vicryl suture was used to advance the posterior axillary line soft tissue anteriorly and secured to serratus and acellular dermis. The incision was closed with 3-0 vicryl in fascial layer and 4-0 vicryl in dermis. Skin closure completed with 4-0 monocryl subcuticular and tissue adhesive. Over left breast, the pectoralis muscle was noted to insert 1-2 cm above desired inframammary fold. The inferior insertions pectoralis muscle divided, taking care to preserve sternal insertions. Acellular dermis perforated and sewn to lateral border pectoralis muscle.A 19 Fr drain was placed and secured to skin with 2-0 nylon. Tissue expander placed and secured to chest with 3-0 vicryl. The inferior border of the acellular dermis was inset to serratus fascia. 0- vicryl suture was used to advance the posterior axillary line soft tissue anteriorly and secured to serratus and acellular dermis. Closure completed in similar fashion. The ports were accessed and filled to 120 ml bilaterally. The patient was brought to upright position and the skin flaps were redraped so that NAC was symmetric from the sternal notch and midline. Transparent, adherent dressings applied. Dry dressing and breast binder applied. Total amount acelluar dermis utilized was 110 cm2 over bilateral chest.  The patient was allowed to wake from anesthesia, extubated and taken to the recovery room in satisfactory condition.   SPECIMENS:  none  DRAINS: 31 Fr JP in right and left breast reconstruction  Irene Limbo, MD [facility] Plastic & Reconstructive Surgery [phone], pin [phone]

## 2016-10-29 NOTE — Anesthesia Procedure Notes (Signed)
Anesthesia Regional Block:  Pectoralis block  Pre-Anesthetic Checklist: ,, timeout performed, Correct Patient, Correct Site, Correct Laterality, Correct Procedure, Correct Position, risks and benefits discussed, surgical consent, pre-op evaluation,  At surgeon's request and post-op pain management  Laterality: Right  Prep: chloraprep       Needles:  Injection technique: Single-shot  Needle Type: Echogenic Needle          Additional Needles:  Procedures: ultrasound guided (picture in chart) Pectoralis block Narrative:  Start time: 10/29/2016 7:21 AM End time: 10/29/2016 7:25 AM Injection made incrementally with aspirations every 5 mL.  Performed by: Personally   Additional Notes: H+P and labs reviewed, risks and benefits discussed with patient, procedure tolerated well without complications

## 2016-10-29 NOTE — Anesthesia Procedure Notes (Addendum)
Procedure Name: Intubation Date/Time: 10/29/2016 7:41 AM Performed by: Manuela Schwartz B Pre-anesthesia Checklist: Patient identified, Emergency Drugs available, Suction available, Patient being monitored and Timeout performed Patient Re-evaluated:Patient Re-evaluated prior to inductionOxygen Delivery Method: Circle system utilized Preoxygenation: Pre-oxygenation with 100% oxygen Intubation Type: IV induction Ventilation: Mask ventilation without difficulty Laryngoscope Size: Mac and 3 Grade View: Grade II Tube type: Oral Tube size: 7.0 mm Number of attempts: 1 Airway Equipment and Method: Stylet Placement Confirmation: ETT inserted through vocal cords under direct vision,  positive ETCO2 and breath sounds checked- equal and bilateral Secured at: 22 cm Tube secured with: Tape Dental Injury: Teeth and Oropharynx as per pre-operative assessment

## 2016-10-29 NOTE — Anesthesia Procedure Notes (Signed)
Anesthesia Regional Block:  Pectoralis block  Pre-Anesthetic Checklist: ,, timeout performed, Correct Patient, Correct Site, Correct Laterality, Correct Procedure, Correct Position, risks and benefits discussed, surgical consent, pre-op evaluation,  At surgeon's request and post-op pain management  Laterality: Left  Prep: chloraprep       Needles:  Injection technique: Single-shot  Needle Type: Echogenic Needle          Additional Needles:  Procedures: ultrasound guided (picture in chart) Pectoralis block Narrative:  Start time: 10/29/2016 7:25 AM End time: 10/29/2016 7:29 AM Injection made incrementally with aspirations every 5 mL.  Performed by: Personally   Additional Notes: H+P and labs reviewed, risks and benefits discussed with patient, procedure tolerated well without complications

## 2016-10-29 NOTE — Interval H&P Note (Signed)
History and Physical Interval Note:  10/29/2016 6:52 AM  [name]  has presented today for surgery, with the diagnosis of [place], DENSE BREASTS  The various methods of treatment have been discussed with the patient and family. After consideration of risks, benefits and other options for treatment, the patient has consented to  Procedure(s): BILATERAL NIPPLE SPARING MASTECTOMIES (Bilateral) BILATERAL BREAST RECONSTRUCTION WITH PLACEMENT OF TISSUE EXPANDER AND  CORTIVA (Bilateral) as a surgical intervention .  The patient's history has been reviewed, patient examined, no change in status, stable for surgery.  I have reviewed the patient's chart and labs.  Questions were answered to the patient's satisfaction.     [name]

## 2016-10-29 NOTE — Anesthesia Preprocedure Evaluation (Signed)
Anesthesia Evaluation  Patient identified by MRN, date of birth, ID band Patient awake    Reviewed: Allergy & Precautions, NPO status , Patient's Chart, lab work & pertinent test results  History of Anesthesia Complications Negative for: history of anesthetic complications  Airway Mallampati: II  TM Distance: >3 FB Neck ROM: Full    Dental  (+) Teeth Intact   Pulmonary shortness of breath, asthma , former smoker,    breath sounds clear to auscultation       Cardiovascular negative cardio ROS   Rhythm:Regular     Neuro/Psych  Headaches, PSYCHIATRIC DISORDERS Depression    GI/Hepatic negative GI ROS, Neg liver ROS,   Endo/Other  negative endocrine ROS  Renal/GU negative Renal ROS     Musculoskeletal  (+) Arthritis ,   Abdominal   Peds  Hematology negative hematology ROS (+)   Anesthesia Other Findings   Reproductive/Obstetrics                             Anesthesia Physical Anesthesia Plan  ASA: II  Anesthesia Plan: General   Post-op Pain Management: GA combined w/ Regional for post-op pain   Induction: Intravenous  Airway Management Planned: Oral ETT  Additional Equipment: None  Intra-op Plan:   Post-operative Plan: Extubation in OR  Informed Consent: I have reviewed the patients History and Physical, chart, labs and discussed the procedure including the risks, benefits and alternatives for the proposed anesthesia with the patient or authorized representative who has indicated his/her understanding and acceptance.   Dental advisory given  Plan Discussed with: CRNA and Surgeon  Anesthesia Plan Comments:         Anesthesia Quick Evaluation

## 2016-10-29 NOTE — Brief Op Note (Signed)
10/29/2016  12:20 PM  PATIENT:  [name]  40 y.o. female  PRE-OPERATIVE DIAGNOSIS:  FIBROCYSTIC CHANGES WITH PAINFUL CYSTS,MULTIPLE PREVIOUS BIOPSIES, DENSE BREASTS  POST-OPERATIVE DIAGNOSIS:  FIBROCYSTIC CHANGES WITH PAINFUL CYSTS,MULTIPLE PREVIOUS BIOPSIES, DENSE BREASTS  PROCEDURE:  Procedure(s): BILATERAL NIPPLE SPARING MASTECTOMIES (Bilateral) BILATERAL BREAST RECONSTRUCTION WITH PLACEMENT OF TISSUE EXPANDER AND  CORTIVA (Bilateral)  SURGEON:  Surgeon(s) and Role: Panel 1:    * Donnie Mesa, MD - Primary  Panel 2:    * Irene Limbo, MD - Primary  PHYSICIAN ASSISTANT:   ASSISTANTS: none   ANESTHESIA:   general  EBL:  Total I/O In: 2600 [I.V.:2600] Out: 200 [Urine:125; Blood:75]  BLOOD ADMINISTERED:none  DRAINS: (19) Jackson-Pratt drain(s) with closed bulb suction in the right and left chest   LOCAL MEDICATIONS USED:  NONE  SPECIMEN:  No Specimen  DISPOSITION OF SPECIMEN:  N/A  COUNTS:  YES  TOURNIQUET:  * No tourniquets in log *  DICTATION: .Note written in EPIC  PLAN OF CARE: Admit for overnight observation  PATIENT DISPOSITION:  PACU - hemodynamically stable.   Delay start of Pharmacological VTE agent (>24hrs) due to surgical blood loss or risk of bleeding: no

## 2016-10-30 DIAGNOSIS — Z803 Family history of malignant neoplasm of breast: Secondary | ICD-10-CM | POA: Diagnosis not present

## 2016-10-30 DIAGNOSIS — D241 Benign neoplasm of right breast: Secondary | ICD-10-CM | POA: Diagnosis not present

## 2016-10-30 DIAGNOSIS — J45909 Unspecified asthma, uncomplicated: Secondary | ICD-10-CM | POA: Diagnosis not present

## 2016-10-30 DIAGNOSIS — N6012 Diffuse cystic mastopathy of left breast: Secondary | ICD-10-CM | POA: Diagnosis not present

## 2016-10-30 DIAGNOSIS — Z87891 Personal history of nicotine dependence: Secondary | ICD-10-CM | POA: Diagnosis not present

## 2016-10-30 DIAGNOSIS — Z91013 Allergy to seafood: Secondary | ICD-10-CM | POA: Diagnosis not present

## 2016-10-30 DIAGNOSIS — N6001 Solitary cyst of right breast: Secondary | ICD-10-CM | POA: Diagnosis not present

## 2016-10-30 DIAGNOSIS — M1990 Unspecified osteoarthritis, unspecified site: Secondary | ICD-10-CM | POA: Diagnosis not present

## 2016-10-30 DIAGNOSIS — D242 Benign neoplasm of left breast: Secondary | ICD-10-CM | POA: Diagnosis not present

## 2016-10-30 DIAGNOSIS — N6082 Other benign mammary dysplasias of left breast: Secondary | ICD-10-CM | POA: Diagnosis not present

## 2016-10-30 DIAGNOSIS — N6081 Other benign mammary dysplasias of right breast: Secondary | ICD-10-CM | POA: Diagnosis not present

## 2016-10-30 DIAGNOSIS — N6011 Diffuse cystic mastopathy of right breast: Secondary | ICD-10-CM | POA: Diagnosis not present

## 2016-10-30 DIAGNOSIS — N6002 Solitary cyst of left breast: Secondary | ICD-10-CM | POA: Diagnosis not present

## 2016-10-30 LAB — BASIC METABOLIC PANEL
ANION GAP: 4 — AB (ref 5–15)
BUN: 6 mg/dL (ref 6–20)
CHLORIDE: 106 mmol/L (ref 101–111)
CO2: 25 mmol/L (ref 22–32)
Calcium: 7 mg/dL — ABNORMAL LOW (ref 8.9–10.3)
Creatinine, Ser: 0.98 mg/dL (ref 0.44–1.00)
GFR calc Af Amer: >60 mL/min (ref >60)
GLUCOSE: 99 mg/dL (ref 65–99)
POTASSIUM: 3.5 mmol/L (ref 3.5–5.1)
Sodium: 135 mmol/L (ref 135–145)

## 2016-10-30 LAB — CBC
HEMATOCRIT: 27.6 % — AB (ref 36.0–46.0)
Hemoglobin: 9.3 g/dL — ABNORMAL LOW (ref 12.0–15.0)
MCH: 29.6 pg (ref 26.0–34.0)
MCHC: 33.7 g/dL (ref 30.0–36.0)
MCV: 87.9 fL (ref 78.0–100.0)
Platelets: 132 10*3/uL — ABNORMAL LOW (ref 150–400)
RBC: 3.14 MIL/uL — AB (ref 3.87–5.11)
RDW: 14 % (ref 11.5–15.5)
WBC: 2.5 10*3/uL — AB (ref 4.0–10.5)

## 2016-10-30 MED ORDER — SULFAMETHOXAZOLE-TRIMETHOPRIM 800-160 MG PO TABS: 1.0000 | ORAL_TABLET | Freq: Two times a day (BID) | ORAL | 0 refills | Status: DC

## 2016-10-30 MED ORDER — METHOCARBAMOL 500 MG PO TABS: 500.0000 mg | ORAL_TABLET | Freq: Four times a day (QID) | ORAL | 0 refills | Status: DC | PRN

## 2016-10-30 MED ORDER — OXYCODONE-ACETAMINOPHEN 10-325 MG PO TABS: 1.0000 | ORAL_TABLET | ORAL | 0 refills | Status: DC | PRN

## 2016-10-30 MED ORDER — MENTHOL 3 MG MT LOZG
1.0000 | LOZENGE | OROMUCOSAL | Status: DC | PRN
  Administered 2016-10-30: 3 mg via ORAL
  Filled 2016-10-30: qty 9

## 2016-10-30 MED ORDER — SODIUM CHLORIDE 0.9 % IV BOLUS (SEPSIS)
500.0000 mL | Freq: Once | INTRAVENOUS | Status: AC
  Administered 2016-10-30: 500 mL via INTRAVENOUS

## 2016-10-30 NOTE — Progress Notes (Signed)
   10/30/16 1200  Clinical Encounter Type  Visited With Patient  Visit Type Initial;Psychological support;Spiritual support  Referral From Nurse  Consult/Referral To Chaplain  Spiritual Encounters  Spiritual Needs Prayer;Emotional  Stress Factors  Patient Stress Factors Family relationships;Health changes    Chaplain responded to consult for prayer. Pt requested prayer for emotional needs, spiritual "rightness", health and family. Chaplain did work with patient regarding story and meaning making,

## 2016-10-30 NOTE — Discharge Summary (Signed)
Physician Discharge Summary  Patient ID: [name] MRN: [id number] DOB/AGE: 1976-01-20 40 y.o.  Admit date: 10/29/2016 Discharge date: 10/30/2016  Admission Diagnoses:  Bilateral dense breasts; multiple recurrent painful breast cysts; multiple previous breast procedures  Discharge Diagnoses: same Active Problems:   Fibrocystic breast changes, bilateral   Discharged Condition: good  Hospital Course: Bilateral nipple sparing mastectomies/ immediate reconstruction 10/29/16.  Did well overnight.  Pain controlled with PO pain meds.  Tolerating diet. Ambulating.  Consults: None  Significant Diagnostic Studies: labs:  Lab Results  Component Value Date   WBC 2.5 (L) 10/30/2016   HGB 9.3 (L) 10/30/2016   HCT 27.6 (L) 10/30/2016   MCV 87.9 10/30/2016   PLT 132 (L) 10/30/2016   Lab Results  Component Value Date   CREATININE 0.98 10/30/2016   BUN 6 10/30/2016   NA 135 10/30/2016   K 3.5 10/30/2016   CL 106 10/30/2016   CO2 25 10/30/2016     Treatments: surgery: as above  Discharge Exam: Blood pressure 109/63, pulse (!) 106, temperature 100.1 F (37.8 C), temperature source Oral, resp. rate 16, weight 64.4 kg (142 lb), last menstrual period 01/07/2013, SpO2 100 %. Chest flat, Tegaderms in place,flaps viable some small incision blood beneath left tegadems  JPs serosanguinous  Disposition: 01-Home or Self Care  Discharge Instructions    Call MD for:  redness, tenderness, or signs of infection (pain, swelling, bleeding, redness, odor or green/yellow discharge around incision site)    Complete by:  As directed    Call MD for:  temperature >100.5    Complete by:  As directed    Discharge instructions    Complete by:  As directed    Ok to remove dressings and shower am 12.21.17. Soap and water ok, pat Tegaderms dry. Leave Tegaderms in place. No creams or ointments over incisions. Do not let drains dangle in shower, attach to lanyard or similar.Strip and record drains  twice daily and bring log to clinic visit.  Breast binder or soft compression bra all other times.  Ok to raise arms above shoulders for bathing and dressing.  No house yard work or exercise until cleared by MD. Merril Abbe as needed for constipation.   Driving Restrictions    Complete by:  As directed    No driving if taking narcotics, no driving for 2 weeks   Lifting restrictions    Complete by:  As directed    No lifting greater than 5 lbs   Resume previous diet    Complete by:  As directed      Allergies as of 10/30/2016      Reactions   Shrimp [shellfish Allergy] Swelling      Medication List    STOP taking these medications   oxyCODONE-acetaminophen 5-325 MG tablet Commonly known as:  ROXICET Replaced by:  oxyCODONE-acetaminophen 10-325 MG tablet     TAKE these medications   albuterol 108 (90 Base) MCG/ACT inhaler Commonly known as:  PROVENTIL HFA;VENTOLIN HFA Inhale 2 puffs into the lungs every 6 (six) hours as needed for wheezing. Reported on 04/17/2016   desvenlafaxine 50 MG 24 hr tablet Commonly known as:  PRISTIQ Take 50 mg by mouth daily.   DULoxetine 60 MG capsule Commonly known as:  CYMBALTA Take 60 mg by mouth daily.   ibuprofen 800 MG tablet Commonly known as:  ADVIL,MOTRIN Take 1 tablet (800 mg total) by mouth 3 (three) times daily. What changed:  when to take this  reasons to take this  Melatonin 3 MG Tabs Take 3 mg by mouth at bedtime as needed (for sleep.).   methocarbamol 500 MG tablet Commonly known as:  ROBAXIN Take 2 tablets (1,000 mg total) by mouth 4 (four) times daily as needed (Pain). What changed:  Another medication with the same name was added. Make sure you understand how and when to take each.   methocarbamol 500 MG tablet Commonly known as:  ROBAXIN Take 1 tablet (500 mg total) by mouth every 6 (six) hours as needed (Pain). What changed:  You were already taking a medication with the same name, and this prescription was added.  Make sure you understand how and when to take each.   naproxen sodium 220 MG tablet Commonly known as:  ANAPROX Take 440 mg by mouth 2 (two) times daily as needed (for pain.).   ondansetron 4 MG tablet Commonly known as:  ZOFRAN Take 1 tablet (4 mg total) by mouth every 8 (eight) hours as needed for nausea or vomiting.   oxyCODONE-acetaminophen 10-325 MG tablet Commonly known as:  PERCOCET Take 1 tablet by mouth every 4 (four) hours as needed for pain. Replaces:  oxyCODONE-acetaminophen 5-325 MG tablet   QUEtiapine 25 MG tablet Commonly known as:  SEROQUEL Take 75 mg by mouth at bedtime.   sulfamethoxazole-trimethoprim 800-160 MG tablet Commonly known as:  BACTRIM DS,SEPTRA DS Take 1 tablet by mouth 2 (two) times daily.   traZODone 100 MG tablet Commonly known as:  DESYREL Take 100-200 mg by mouth at bedtime.   VYVANSE 40 MG capsule Generic drug:  lisdexamfetamine Take 40 mg by mouth daily.      Follow-up Information    THIMMAPPA, BRINDA, MD Follow up in 1 week(s).   Specialty:  Plastic Surgery Why:  as scheduled Contact information: [address] [place] [address] [id number]        Maia Petties., MD. Schedule an appointment as soon as possible for a visit in 2 week(s).   Specialty:  General Surgery Contact information: [address] [place] [address] [phone]           Signed: Maia Petties. 10/30/2016, 8:13 AM

## 2016-10-30 NOTE — Progress Notes (Signed)
  POD #1 bilateral NSM, TE/ADM reconstruction  Reports pain, has been out of bed, using IS  Temp:  [97.6 F (36.4 C)-100.3 F (37.9 C)] 100.1 F (37.8 C) (12/20 0443) Pulse Rate:  [101-120] 106 (12/20 0443) Resp:  [12-17] 16 (12/20 0443) BP: (97-124)/(44-78) 109/63 (12/20 0443) SpO2:  [95 %-100 %] 100 % (12/20 0443)   JP R 190 L 215  PE Chest flat, Tegaderms in place,flaps viable some small incision blood beneath left tegadems  JPs serosanguinous  A/P: Plan home this pm if pain controlled. F.u arranged. Drain teaching. Counseled pt the low grade temp normal post anesthesia and needs to use IS  Irene Limbo, MD [facility] Plastic & Reconstructive Surgery [phone], pin [phone]

## 2016-10-30 NOTE — Progress Notes (Signed)
1 Day Post-Op  Subjective: Patient resting comfortably No nausea - tolerating clears; awaiting regular breakfast Pain controlled with mostly PO pain meds   Objective: Vital signs in last 24 hours: Temp:  [97.6 F (36.4 C)-100.3 F (37.9 C)] 100.1 F (37.8 C) (12/20 0443) Pulse Rate:  [101-120] 106 (12/20 0443) Resp:  [12-17] 16 (12/20 0443) BP: (97-124)/(44-78) 109/63 (12/20 0443) SpO2:  [95 %-100 %] 100 % (12/20 0443) Last BM Date:  (PTA; pt. does not remember)  Intake/Output from previous day: 12/19 0701 - 12/20 0700 In: 4786.3 [P.O.:600; I.V.:4086.3; IV Piggyback:100] Out: 1155 [Urine:675; Drains:405; Blood:75] Intake/Output this shift: Total I/O In: -  Out: 100 [Urine:100]  Wound checked recently by Dr. Iran Planas  Lab Results:   Recent Labs  10/30/16 0406  WBC 2.5*  HGB 9.3*  HCT 27.6*  PLT 132*   BMET  Recent Labs  10/30/16 0406  NA 135  K 3.5  CL 106  CO2 25  GLUCOSE 99  BUN 6  CREATININE 0.98  CALCIUM 7.0*   PT/INR No results for input(s): LABPROT, INR in the last 72 hours. ABG No results for input(s): PHART, HCO3 in the last 72 hours.  Invalid input(s): PCO2, PO2  Studies/Results: No results found.  Anti-infectives: Anti-infectives    Start     Dose/Rate Route Frequency Ordered Stop   10/30/16 0000  sulfamethoxazole-trimethoprim (BACTRIM DS,SEPTRA DS) 800-160 MG tablet     1 tablet Oral 2 times daily 10/30/16 0747     10/29/16 1700  ceFAZolin (ANCEF) IVPB 1 g/50 mL premix     1 g 100 mL/hr over 30 Minutes Intravenous Every 8 hours 10/29/16 1415 10/30/16 2159   10/29/16 1102  bacitracin 50,000 Units, gentamicin (GARAMYCIN) 80 mg, ceFAZolin (ANCEF) 1 g in sodium chloride 0.9 % 1,000 mL  Status:  Discontinued       As needed 10/29/16 1103 10/29/16 1224   10/29/16 0730  bacitracin 50,000 Units, gentamicin (GARAMYCIN) 80 mg, ceFAZolin (ANCEF) 1 g in sodium chloride 0.9 % 1,000 mL  Status:  Discontinued      Irrigation To Surgery 10/29/16  0720 10/29/16 1354   10/29/16 0553  ceFAZolin (ANCEF) IVPB 2g/100 mL premix     2 g 200 mL/hr over 30 Minutes Intravenous On call to O.R. 10/29/16 0553 10/29/16 1144      Assessment/Plan: s/p Procedure(s): BILATERAL NIPPLE SPARING MASTECTOMIES (Bilateral) BILATERAL BREAST RECONSTRUCTION WITH PLACEMENT OF TISSUE EXPANDER AND  CORTIVA (Bilateral) Will allow patient to try regular diet/ ambulate.  Probable discharge this afternoon.  LOS: 0 days    Aberdeen Hafen K. 10/30/2016

## 2016-10-30 NOTE — Progress Notes (Signed)
Discharge paperwork given to patient. Patient taught how to empty drains and how to strip them. Patient given prescriptions. Patient given sheet to record JP output. No questions verbalized.

## 2016-10-31 ENCOUNTER — Encounter (HOSPITAL_COMMUNITY): Payer: Self-pay | Admitting: Surgery

## 2017-01-10 NOTE — H&P (Signed)
Subjective:     Patient ID: [name] is a 41 y.o. female.  HPI   2 months post op bilateral mastectomies and immediate reconstruction for dense breasts, significant fibrocystic breasts that limited surveillance of breasts.  Presented to [facility] ED 03/2016 with c/o breast mass, reported she could not get MMG as she had no PCP. Subsequest establishment to GYN and referral for MMG/US. MMG and [place] benign, palpable mass cyst, breast density D. She has had history fibrocystic disease and prior aspirations/resections. 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 Lives with adult daughter. She quit smoking 06/2016  Prior 36 B, desires more lifted, "C" cup. Right mastectomy 453 g Left 346 g.   Review of Systems    Objective:   Physical Exam  Cardiovascular: Normal rate and regular rhythm.   Pulmonary/Chest: Effort normal and breath sounds normal.   Chest: bilateral nipple with hypopigmentation that is resolving, right upper chest 5 mm nodular area consistent with fat necrosis, axillae benign, no swelling Sn to nipple R 18 cm L 17 cm BW R 11.5 L 12 cm Nipple to IMF R 10 L 10 cm  Assessment:     Dense breasts, Family history breast ca S/p bilateral NSM, TE/ADM (Cortiva) reconstruction    Plan:     Plan second stage implant exchange. Pictures today. Counseled cannot assure final cup size but will be larger than in TE. Desma Maxim handout provided.   Discussed saline vs silcone, anatomic vs round, textured vs smooth. All share risk rupture, capsular contracture, rippling, displacement, need for revision. Silicone would require MRI surveillance for rupture. Reviewed purpose of texturing, including to prevent displacement, discussed incidence ALCL with this. She has elected for round silicone.  Plan overnight stay. Will utilize same scars. Additional risks including but  not limited to seroma, hematoma, infection, additional surgery, unacceptable cosmetic result, damage to deeper structures, DVT/PE reviewed.  Natrelle 133MX-11-T 300 ml tissue expanders placed bilateral, RIGHT 290  ml total fill volume LEFT  290 ml total fill volume  Irene Limbo, MD [facility] Plastic & Reconstructive Surgery [phone], pin [phone]

## 2017-01-14 ENCOUNTER — Encounter (HOSPITAL_BASED_OUTPATIENT_CLINIC_OR_DEPARTMENT_OTHER): Payer: Self-pay | Admitting: *Deleted

## 2017-01-14 NOTE — Pre-Procedure Instructions (Signed)
To come pick up Boost Breeze 8 oz. to drink by 0415 DOS. 

## 2017-01-14 NOTE — Pre-Procedure Instructions (Signed)
8 oz. Boost Breeze given to pt. with instructions to drink by 0415 DOS; pt. voiced understanding.

## 2017-01-21 ENCOUNTER — Encounter (HOSPITAL_BASED_OUTPATIENT_CLINIC_OR_DEPARTMENT_OTHER)
Admission: RE | Disposition: A | Discharge status: Home / Self Care | Payer: Self-pay | Source: Ambulatory Visit | Attending: Plastic Surgery

## 2017-01-21 ENCOUNTER — Encounter (HOSPITAL_BASED_OUTPATIENT_CLINIC_OR_DEPARTMENT_OTHER): Payer: Self-pay | Admitting: *Deleted

## 2017-01-21 ENCOUNTER — Ambulatory Visit (HOSPITAL_BASED_OUTPATIENT_CLINIC_OR_DEPARTMENT_OTHER): Payer: Medicare Other | Admitting: Anesthesiology

## 2017-01-21 ENCOUNTER — Ambulatory Visit (HOSPITAL_BASED_OUTPATIENT_CLINIC_OR_DEPARTMENT_OTHER)
Admission: RE | Admit: 2017-01-21 | Discharge: 2017-01-22 | Disposition: A | Discharge status: Home / Self Care | Payer: Medicare Other | Source: Ambulatory Visit | Attending: Plastic Surgery | Admitting: Plastic Surgery

## 2017-01-21 DIAGNOSIS — Z87891 Personal history of nicotine dependence: Secondary | ICD-10-CM | POA: Diagnosis not present

## 2017-01-21 DIAGNOSIS — Z803 Family history of malignant neoplasm of breast: Secondary | ICD-10-CM | POA: Diagnosis not present

## 2017-01-21 DIAGNOSIS — Z421 Encounter for breast reconstruction following mastectomy: Secondary | ICD-10-CM | POA: Diagnosis not present

## 2017-01-21 DIAGNOSIS — F419 Anxiety disorder, unspecified: Secondary | ICD-10-CM | POA: Insufficient documentation

## 2017-01-21 DIAGNOSIS — G4730 Sleep apnea, unspecified: Secondary | ICD-10-CM | POA: Insufficient documentation

## 2017-01-21 DIAGNOSIS — F329 Major depressive disorder, single episode, unspecified: Secondary | ICD-10-CM | POA: Diagnosis not present

## 2017-01-21 DIAGNOSIS — Z9010 Acquired absence of unspecified breast and nipple: Secondary | ICD-10-CM

## 2017-01-21 DIAGNOSIS — Z9013 Acquired absence of bilateral breasts and nipples: Secondary | ICD-10-CM | POA: Insufficient documentation

## 2017-01-21 DIAGNOSIS — Z79899 Other long term (current) drug therapy: Secondary | ICD-10-CM | POA: Diagnosis not present

## 2017-01-21 DIAGNOSIS — Z981 Arthrodesis status: Secondary | ICD-10-CM | POA: Diagnosis not present

## 2017-01-21 DIAGNOSIS — J45909 Unspecified asthma, uncomplicated: Secondary | ICD-10-CM | POA: Diagnosis not present

## 2017-01-21 DIAGNOSIS — J309 Allergic rhinitis, unspecified: Secondary | ICD-10-CM | POA: Diagnosis not present

## 2017-01-21 DIAGNOSIS — G4733 Obstructive sleep apnea (adult) (pediatric): Secondary | ICD-10-CM | POA: Diagnosis not present

## 2017-01-21 HISTORY — DX: Other asthma: J45.998

## 2017-01-21 HISTORY — PX: REMOVAL OF BILATERAL TISSUE EXPANDERS WITH PLACEMENT OF BILATERAL BREAST IMPLANTS: SHX6431

## 2017-01-21 HISTORY — DX: Migraine, unspecified, not intractable, without status migrainosus: G43.909

## 2017-01-21 HISTORY — DX: Anxiety disorder, unspecified: F41.9

## 2017-01-21 SURGERY — REMOVAL, TISSUE EXPANDER, BREAST, BILATERAL, WITH BILATERAL IMPLANT IMPLANT INSERTION
Anesthesia: General | Laterality: Bilateral

## 2017-01-21 MED ORDER — MEPERIDINE HCL 25 MG/ML IJ SOLN: 6.2500 mg | INTRAMUSCULAR | Status: DC | PRN

## 2017-01-21 MED ORDER — KETOROLAC TROMETHAMINE 30 MG/ML IJ SOLN
30.0000 mg | Freq: Three times a day (TID) | INTRAMUSCULAR | Status: AC
  Administered 2017-01-21 – 2017-01-22 (×3): 30 mg via INTRAVENOUS
  Filled 2017-01-21 (×3): qty 1

## 2017-01-21 MED ORDER — CEFAZOLIN SODIUM-DEXTROSE 2-4 GM/100ML-% IV SOLN
2.0000 g | INTRAVENOUS | Status: AC
  Administered 2017-01-21: 2 g via INTRAVENOUS

## 2017-01-21 MED ORDER — DIPHENHYDRAMINE HCL 50 MG/ML IJ SOLN: 25.0000 mg | Freq: Four times a day (QID) | INTRAMUSCULAR | Status: DC | PRN

## 2017-01-21 MED ORDER — LIDOCAINE 2% (20 MG/ML) 5 ML SYRINGE
INTRAMUSCULAR | Status: DC | PRN
  Administered 2017-01-21: 80 mg via INTRAVENOUS

## 2017-01-21 MED ORDER — LISDEXAMFETAMINE DIMESYLATE 20 MG PO CAPS: 40.0000 mg | ORAL_CAPSULE | Freq: Every day | ORAL | Status: DC

## 2017-01-21 MED ORDER — ONDANSETRON 4 MG PO TBDP: 4.0000 mg | ORAL_TABLET | Freq: Four times a day (QID) | ORAL | Status: DC | PRN

## 2017-01-21 MED ORDER — SUGAMMADEX SODIUM 200 MG/2ML IV SOLN
INTRAVENOUS | Status: DC | PRN
  Administered 2017-01-21: 150 mg via INTRAVENOUS

## 2017-01-21 MED ORDER — ROCURONIUM BROMIDE 10 MG/ML (PF) SYRINGE
PREFILLED_SYRINGE | INTRAVENOUS | Status: DC | PRN
  Administered 2017-01-21: 10 mg via INTRAVENOUS
  Administered 2017-01-21: 40 mg via INTRAVENOUS

## 2017-01-21 MED ORDER — SCOPOLAMINE 1 MG/3DAYS TD PT72: 1.0000 | MEDICATED_PATCH | Freq: Once | TRANSDERMAL | Status: DC | PRN

## 2017-01-21 MED ORDER — ARTIFICIAL TEARS OP OINT
TOPICAL_OINTMENT | OPHTHALMIC | Status: AC
Start: 2017-01-21 — End: 2017-01-21
  Filled 2017-01-21: qty 3.5

## 2017-01-21 MED ORDER — FENTANYL CITRATE (PF) 100 MCG/2ML IJ SOLN
25.0000 ug | INTRAMUSCULAR | Status: DC | PRN
  Administered 2017-01-21 (×2): 50 ug via INTRAVENOUS

## 2017-01-21 MED ORDER — QUETIAPINE FUMARATE 50 MG PO TABS
75.0000 mg | ORAL_TABLET | Freq: Every day | ORAL | Status: DC
  Administered 2017-01-21: 75 mg via ORAL
  Filled 2017-01-21: qty 1

## 2017-01-21 MED ORDER — CEFAZOLIN SODIUM-DEXTROSE 2-4 GM/100ML-% IV SOLN
INTRAVENOUS | Status: AC
  Filled 2017-01-21: qty 100

## 2017-01-21 MED ORDER — HYDROMORPHONE HCL 1 MG/ML IJ SOLN: 0.5000 mg | INTRAMUSCULAR | Status: DC | PRN

## 2017-01-21 MED ORDER — CELECOXIB 200 MG PO CAPS
ORAL_CAPSULE | ORAL | Status: AC
  Filled 2017-01-21: qty 2

## 2017-01-21 MED ORDER — MIDAZOLAM HCL 2 MG/2ML IJ SOLN
1.0000 mg | INTRAMUSCULAR | Status: DC | PRN
  Administered 2017-01-21: 2 mg via INTRAVENOUS

## 2017-01-21 MED ORDER — MIDAZOLAM HCL 2 MG/2ML IJ SOLN
INTRAMUSCULAR | Status: AC
  Filled 2017-01-21: qty 2

## 2017-01-21 MED ORDER — CHLORHEXIDINE GLUCONATE CLOTH 2 % EX PADS: 6.0000 | MEDICATED_PAD | Freq: Once | CUTANEOUS | Status: DC

## 2017-01-21 MED ORDER — SODIUM BICARBONATE 4 % IV SOLN
INTRAVENOUS | Status: AC
  Filled 2017-01-21: qty 10

## 2017-01-21 MED ORDER — ACETAMINOPHEN 500 MG PO TABS
1000.0000 mg | ORAL_TABLET | ORAL | Status: AC
  Administered 2017-01-21: 1000 mg via ORAL

## 2017-01-21 MED ORDER — KCL IN DEXTROSE-NACL 20-5-0.45 MEQ/L-%-% IV SOLN
INTRAVENOUS | Status: DC
  Administered 2017-01-21: 12:00:00 via INTRAVENOUS
  Filled 2017-01-21: qty 1000

## 2017-01-21 MED ORDER — ROCURONIUM BROMIDE 50 MG/5ML IV SOSY
PREFILLED_SYRINGE | INTRAVENOUS | Status: AC
  Filled 2017-01-21: qty 5

## 2017-01-21 MED ORDER — METHOCARBAMOL 500 MG PO TABS: 500.0000 mg | ORAL_TABLET | Freq: Three times a day (TID) | ORAL | 0 refills | Status: DC | PRN

## 2017-01-21 MED ORDER — LIDOCAINE 2% (20 MG/ML) 5 ML SYRINGE
INTRAMUSCULAR | Status: AC
  Filled 2017-01-21: qty 5

## 2017-01-21 MED ORDER — ONDANSETRON HCL 4 MG/2ML IJ SOLN
4.0000 mg | Freq: Four times a day (QID) | INTRAMUSCULAR | Status: DC | PRN
  Administered 2017-01-21 (×2): 4 mg via INTRAVENOUS
  Filled 2017-01-21 (×2): qty 2

## 2017-01-21 MED ORDER — ONDANSETRON HCL 4 MG/2ML IJ SOLN
INTRAMUSCULAR | Status: DC | PRN
  Administered 2017-01-21: 4 mg via INTRAVENOUS

## 2017-01-21 MED ORDER — ACETAMINOPHEN 500 MG PO TABS
ORAL_TABLET | ORAL | Status: AC
  Filled 2017-01-21: qty 2

## 2017-01-21 MED ORDER — FENTANYL CITRATE (PF) 100 MCG/2ML IJ SOLN
INTRAMUSCULAR | Status: AC
  Filled 2017-01-21: qty 2

## 2017-01-21 MED ORDER — LACTATED RINGERS IV SOLN
INTRAVENOUS | Status: DC
  Administered 2017-01-21 (×2): via INTRAVENOUS

## 2017-01-21 MED ORDER — FENTANYL CITRATE (PF) 100 MCG/2ML IJ SOLN
INTRAMUSCULAR | Status: AC
  Filled 2017-01-21: qty 4

## 2017-01-21 MED ORDER — OXYCODONE-ACETAMINOPHEN 10-325 MG PO TABS
1.0000 | ORAL_TABLET | ORAL | 0 refills | Status: DC | PRN
Start: 2017-01-21 — End: 2017-04-29

## 2017-01-21 MED ORDER — GABAPENTIN 300 MG PO CAPS
300.0000 mg | ORAL_CAPSULE | ORAL | Status: AC
  Administered 2017-01-21: 300 mg via ORAL

## 2017-01-21 MED ORDER — TRAZODONE HCL 100 MG PO TABS
100.0000 mg | ORAL_TABLET | Freq: Every day | ORAL | Status: DC
Start: 2017-01-21 — End: 2017-01-22
  Administered 2017-01-21: 150 mg via ORAL
  Filled 2017-01-21: qty 2

## 2017-01-21 MED ORDER — METOCLOPRAMIDE HCL 5 MG/ML IJ SOLN
10.0000 mg | Freq: Once | INTRAMUSCULAR | Status: AC | PRN
  Administered 2017-01-21: 10 mg via INTRAVENOUS

## 2017-01-21 MED ORDER — PROPOFOL 10 MG/ML IV BOLUS
INTRAVENOUS | Status: AC
  Filled 2017-01-21: qty 40

## 2017-01-21 MED ORDER — LIDOCAINE HCL (PF) 1 % IJ SOLN
INTRAMUSCULAR | Status: AC
  Filled 2017-01-21: qty 60

## 2017-01-21 MED ORDER — DEXAMETHASONE SODIUM PHOSPHATE 4 MG/ML IJ SOLN
INTRAMUSCULAR | Status: DC | PRN
  Administered 2017-01-21: 10 mg via INTRAVENOUS

## 2017-01-21 MED ORDER — CELECOXIB 400 MG PO CAPS
400.0000 mg | ORAL_CAPSULE | ORAL | Status: AC
  Administered 2017-01-21: 400 mg via ORAL

## 2017-01-21 MED ORDER — HYDROCODONE-ACETAMINOPHEN 7.5-325 MG PO TABS: 1.0000 | ORAL_TABLET | Freq: Once | ORAL | Status: DC | PRN

## 2017-01-21 MED ORDER — PROPOFOL 10 MG/ML IV BOLUS
INTRAVENOUS | Status: DC | PRN
Start: 2017-01-21 — End: 2017-01-21
  Administered 2017-01-21: 40 mg via INTRAVENOUS
  Administered 2017-01-21: 150 mg via INTRAVENOUS

## 2017-01-21 MED ORDER — DEXAMETHASONE SODIUM PHOSPHATE 10 MG/ML IJ SOLN
INTRAMUSCULAR | Status: AC
  Filled 2017-01-21: qty 1

## 2017-01-21 MED ORDER — METOCLOPRAMIDE HCL 5 MG/ML IJ SOLN
INTRAMUSCULAR | Status: AC
  Filled 2017-01-21: qty 2

## 2017-01-21 MED ORDER — FENTANYL CITRATE (PF) 100 MCG/2ML IJ SOLN
50.0000 ug | INTRAMUSCULAR | Status: AC | PRN
  Administered 2017-01-21: 50 ug via INTRAVENOUS
  Administered 2017-01-21 (×4): 25 ug via INTRAVENOUS

## 2017-01-21 MED ORDER — METHOCARBAMOL 500 MG PO TABS: 500.0000 mg | ORAL_TABLET | Freq: Three times a day (TID) | ORAL | Status: DC | PRN

## 2017-01-21 MED ORDER — SULFAMETHOXAZOLE-TRIMETHOPRIM 800-160 MG PO TABS: 1.0000 | ORAL_TABLET | Freq: Two times a day (BID) | ORAL | 0 refills | Status: DC

## 2017-01-21 MED ORDER — CEFAZOLIN IN D5W 1 GM/50ML IV SOLN
1.0000 g | Freq: Three times a day (TID) | INTRAVENOUS | Status: AC
  Administered 2017-01-21 – 2017-01-22 (×3): 1 g via INTRAVENOUS
  Filled 2017-01-21 (×3): qty 50

## 2017-01-21 MED ORDER — ONDANSETRON HCL 4 MG/2ML IJ SOLN
INTRAMUSCULAR | Status: AC
  Filled 2017-01-21: qty 2

## 2017-01-21 MED ORDER — GABAPENTIN 300 MG PO CAPS
ORAL_CAPSULE | ORAL | Status: AC
  Filled 2017-01-21: qty 1

## 2017-01-21 MED ORDER — OXYCODONE-ACETAMINOPHEN 5-325 MG PO TABS
1.0000 | ORAL_TABLET | ORAL | Status: DC | PRN
  Administered 2017-01-21: 2 via ORAL
  Administered 2017-01-21: 1 via ORAL
  Filled 2017-01-21: qty 1
  Filled 2017-01-21: qty 2

## 2017-01-21 MED ORDER — EPINEPHRINE 30 MG/30ML IJ SOLN
INTRAMUSCULAR | Status: AC
  Filled 2017-01-21: qty 1

## 2017-01-21 MED ORDER — DIPHENHYDRAMINE HCL 25 MG PO CAPS: 25.0000 mg | ORAL_CAPSULE | Freq: Four times a day (QID) | ORAL | Status: DC | PRN

## 2017-01-21 SURGICAL SUPPLY — 67 items
BAG DECANTER FOR FLEXI CONT (MISCELLANEOUS) ×3 IMPLANT
BINDER BREAST 3XL (BIND) IMPLANT
BINDER BREAST LRG (GAUZE/BANDAGES/DRESSINGS) ×2 IMPLANT
BINDER BREAST MEDIUM (GAUZE/BANDAGES/DRESSINGS) IMPLANT
BINDER BREAST XLRG (GAUZE/BANDAGES/DRESSINGS) IMPLANT
BINDER BREAST XXLRG (GAUZE/BANDAGES/DRESSINGS) IMPLANT
BLADE SURG 10 STRL SS (BLADE) ×8 IMPLANT
BNDG GAUZE ELAST 4 BULKY (GAUZE/BANDAGES/DRESSINGS) ×6 IMPLANT
CANISTER SUCT 1200ML W/VALVE (MISCELLANEOUS) ×3 IMPLANT
CHLORAPREP W/TINT 26ML (MISCELLANEOUS) ×3 IMPLANT
COVER BACK TABLE 60X90IN (DRAPES) ×3 IMPLANT
COVER MAYO STAND STRL (DRAPES) ×3 IMPLANT
DECANTER SPIKE VIAL GLASS SM (MISCELLANEOUS) IMPLANT
DRAIN CHANNEL 15F RND FF W/TCR (WOUND CARE) IMPLANT
DRAPE TOP ARMCOVERS (MISCELLANEOUS) ×3 IMPLANT
DRAPE U-SHAPE 76X120 STRL (DRAPES) ×3 IMPLANT
DRSG PAD ABDOMINAL 8X10 ST (GAUZE/BANDAGES/DRESSINGS) ×8 IMPLANT
DRSG TEGADERM 4X4.75 (GAUZE/BANDAGES/DRESSINGS) ×8 IMPLANT
ELECT BLADE 4.0 EZ CLEAN MEGAD (MISCELLANEOUS) ×3
ELECT COATED BLADE 2.86 ST (ELECTRODE) ×3 IMPLANT
ELECT REM PT RETURN 9FT ADLT (ELECTROSURGICAL) ×3
ELECTRODE BLDE 4.0 EZ CLN MEGD (MISCELLANEOUS) ×1 IMPLANT
ELECTRODE REM PT RTRN 9FT ADLT (ELECTROSURGICAL) ×1 IMPLANT
EVACUATOR SILICONE 100CC (DRAIN) IMPLANT
GLOVE BIO SURGEON STRL SZ 6 (GLOVE) ×6 IMPLANT
GLOVE BIOGEL PI IND STRL 7.0 (GLOVE) IMPLANT
GLOVE BIOGEL PI INDICATOR 7.0 (GLOVE) ×4
GLOVE ECLIPSE 6.5 STRL STRAW (GLOVE) ×4 IMPLANT
GOWN STRL REUS W/ TWL LRG LVL3 (GOWN DISPOSABLE) ×2 IMPLANT
GOWN STRL REUS W/TWL LRG LVL3 (GOWN DISPOSABLE) ×6
IMPL BREAST P5.8: XFULL 400 (Breast) IMPLANT
IMPL BRST P5.8: XFULL 400CC (Breast) ×2 IMPLANT
IMPLANT BREAST GEL 400CC (Breast) ×6 IMPLANT
IV NS 500ML (IV SOLUTION)
IV NS 500ML BAXH (IV SOLUTION) IMPLANT
KIT FILL SYSTEM UNIVERSAL (SET/KITS/TRAYS/PACK) IMPLANT
LIQUID BAND (GAUZE/BANDAGES/DRESSINGS) ×6 IMPLANT
NDL HYPO 25X1 1.5 SAFETY (NEEDLE) IMPLANT
NEEDLE HYPO 25X1 1.5 SAFETY (NEEDLE) IMPLANT
PACK BASIN DAY SURGERY FS (CUSTOM PROCEDURE TRAY) ×3 IMPLANT
PENCIL BUTTON HOLSTER BLD 10FT (ELECTRODE) ×3 IMPLANT
PIN SAFETY STERILE (MISCELLANEOUS) ×3 IMPLANT
SHEET MEDIUM DRAPE 40X70 STRL (DRAPES) ×4 IMPLANT
SIZER BREAST REUSE 375CC (SIZER) ×2
SIZER BREAST REUSE 400CC (SIZER) ×3
SIZER BREAST REUSE 420CC (SIZER) ×3
SIZER BRST REUSE 375CC (SIZER) IMPLANT
SIZER BRST REUSE 420CC (SIZER) IMPLANT
SIZER BRST REUSE P5.8X 400CC (SIZER) IMPLANT
SLEEVE SCD COMPRESS KNEE MED (MISCELLANEOUS) ×3 IMPLANT
SPONGE LAP 18X18 X RAY DECT (DISPOSABLE) ×8 IMPLANT
STAPLER VISISTAT 35W (STAPLE) ×3 IMPLANT
SUT ETHILON 2 0 FS 18 (SUTURE) IMPLANT
SUT MNCRL AB 4-0 PS2 18 (SUTURE) ×6 IMPLANT
SUT PDS AB 2-0 CT2 27 (SUTURE) ×4 IMPLANT
SUT VIC AB 3-0 PS1 18 (SUTURE)
SUT VIC AB 3-0 PS1 18XBRD (SUTURE) IMPLANT
SUT VIC AB 3-0 SH 27 (SUTURE) ×6
SUT VIC AB 3-0 SH 27X BRD (SUTURE) ×2 IMPLANT
SUT VICRYL 4-0 PS2 18IN ABS (SUTURE) ×6 IMPLANT
SYR BULB IRRIGATION 50ML (SYRINGE) ×6 IMPLANT
SYR CONTROL 10ML LL (SYRINGE) IMPLANT
TOWEL OR 17X24 6PK STRL BLUE (TOWEL DISPOSABLE) ×6 IMPLANT
TUBE CONNECTING 20'X1/4 (TUBING) ×1
TUBE CONNECTING 20X1/4 (TUBING) ×2 IMPLANT
UNDERPAD 30X30 (UNDERPADS AND DIAPERS) ×6 IMPLANT
YANKAUER SUCT BULB TIP NO VENT (SUCTIONS) ×3 IMPLANT

## 2017-01-21 NOTE — Op Note (Addendum)
Operative Note   DATE OF OPERATION: 3.13.18  LOCATION: [facility]-observtion  SURGICAL DIVISION: Plastic Surgery  PREOPERATIVE DIAGNOSES:  1. Acquired absence breasts 2. History dense breasts  POSTOPERATIVE DIAGNOSES:  same  PROCEDURE:  1. Removal bilateral tissue expanders and placement silicone implants  SURGEON: Irene Limbo MD MBA  ASSISTANT: none  ANESTHESIA:  General.   EBL: 50 ml  COMPLICATIONS: None immediate.   INDICATIONS FOR PROCEDURE:  The patient, [name], is a 41 y.o. female born on 23-Dec-1975, is here for second stage breast reconstruction following bilateral nipple sparing mastectomies and immediate expander, acellular dermis reconstruction.   FINDINGS: Full incorporation ADM bilateral. Natrelle Inspira Smooth Round Extra Projection 400 ml implants placed bilateral. REF SRX-400 RIGHT SN [id number] LEFT SN [id number]  DESCRIPTION OF PROCEDURE:  The patient's operative site was marked with the patient in the preoperative area to mark sternal notch, chest midline, and anterior axillary lines. The patient was taken to the operating room. SCDs were placed and IV antibiotics were given. A time out was performed and all information was confirmed to be correct. The patient's chest was then prepped and draped in a sterile fashion.   Incision made in right inframammary fold mastectomy scar. Incision carried through superficial fascia to capsule. Expander removed. Capsulotomies performed superiorly and medially with radial scoring. Sizer was placed, and I then directed attention to left breast. Left inframammary fold was noted to be lower on left and superior displacement of NAC. Popcorn capsulorraphy performed along inferior capsule followed by interrupted 2-0 PDS from anterior capsule to chest wall to set inframammary fold. Superior and medial capsulotomies performed with radial scoring.Sizer placed. Patient brought to upright sitting position and assessed for  symmetry. Small amount of inframammary fold skin marked over left by tailor tacking.Natrelle Inspira Smooth Round Extra Projection implants selected, 400 ml.  Patient returned to supine position and area marked by tailor tacking was de epithelialized over left chest. Bilateral cavities irrigated with solution containing Ancef, gentamicin, andbacitracin. Pockest inspected for hemostasis. Cavitiesthen irrigated with Betadine. Implant placed in each cavity and orientation implant confirmed. Closure was completed with running 3-0 vicryl for approximation of capsule and superficial fascia. 4-0 vicryl was placed in dermis and running 4-0 monocryl was used to close skin. Tissue adhesive was applied to breast incisions. Tegaderms applied to left breast reconstruction. Dry dressing and compression bra applied  The patient was allowed to wake from anesthesia, extubated and taken to the recovery room in satisfactory condition.   SPECIMENS: none  DRAINS: none  Irene Limbo, MD [facility] Plastic & Reconstructive Surgery [phone], pin [phone]

## 2017-01-21 NOTE — Transfer of Care (Signed)
Immediate Anesthesia Transfer of Care Note  Patient: [name]  Procedure(s) Performed: Procedure(s): REMOVAL OF BILATERAL TISSUE EXPANDERS WITH PLACEMENT OF BILATERAL BREAST IMPLANTS (Bilateral)  Patient Location: PACU  Anesthesia Type:General  Level of Consciousness: awake, alert , oriented and patient cooperative  Airway & Oxygen Therapy: Patient Spontanous Breathing and Patient connected to face mask oxygen  Post-op Assessment: Report given to RN and Post -op Vital signs reviewed and stable  Post vital signs: Reviewed and stable  Last Vitals:  Vitals:   01/21/17 0646  BP: 124/80  Pulse: 95  Resp: 18  Temp: 36.7 C    Last Pain:  Vitals:   01/21/17 0646  TempSrc: Oral  PainSc: 9       Patients Stated Pain Goal: 6 (61/90/12 2241)  Complications: No apparent anesthesia complications

## 2017-01-21 NOTE — Anesthesia Procedure Notes (Signed)
Procedure Name: Intubation Date/Time: 01/21/2017 7:42 AM Performed by: Wanita Chamberlain Pre-anesthesia Checklist: Patient identified, Emergency Drugs available, Suction available, Patient being monitored and Timeout performed Patient Re-evaluated:Patient Re-evaluated prior to inductionOxygen Delivery Method: Circle system utilized Preoxygenation: Pre-oxygenation with 100% oxygen Intubation Type: IV induction Ventilation: Mask ventilation without difficulty Laryngoscope Size: Mac and 3 Grade View: Grade II Tube type: Oral Tube size: 7.0 mm Number of attempts: 1 Airway Equipment and Method: Stylet Placement Confirmation: ETT inserted through vocal cords under direct vision,  positive ETCO2 and breath sounds checked- equal and bilateral Secured at: 22 cm Tube secured with: Tape Dental Injury: Teeth and Oropharynx as per pre-operative assessment

## 2017-01-21 NOTE — Anesthesia Postprocedure Evaluation (Signed)
Anesthesia Post Note  Patient: Beckett Hickmon  Procedure(s) Performed: Procedure(s) (LRB): REMOVAL OF BILATERAL TISSUE EXPANDERS WITH PLACEMENT OF BILATERAL BREAST IMPLANTS (Bilateral)  Patient location during evaluation: PACU Anesthesia Type: General Level of consciousness: awake and alert and oriented Pain management: pain level controlled Vital Signs Assessment: post-procedure vital signs reviewed and stable Respiratory status: spontaneous breathing, nonlabored ventilation and respiratory function stable Cardiovascular status: blood pressure returned to baseline and stable Postop Assessment: no signs of nausea or vomiting Anesthetic complications: no       Last Vitals:  Vitals:   01/21/17 1000 01/21/17 1015  BP: (!) 145/100 (!) 140/97  Pulse: 98 91  Resp: 17 10  Temp:      Last Pain:  Vitals:   01/21/17 1015  TempSrc:   PainSc: 9                  Sahara Fujimoto A.

## 2017-01-21 NOTE — Anesthesia Preprocedure Evaluation (Addendum)
Anesthesia Evaluation  Patient identified by MRN, date of birth, ID band Patient awake    Reviewed: Allergy & Precautions, NPO status , Patient's Chart, lab work & pertinent test results  History of Anesthesia Complications (+) AWARENESS UNDER ANESTHESIA and history of anesthetic complications  Airway Mallampati: III  TM Distance: >3 FB Neck ROM: Full    Dental no notable dental hx. (+) Teeth Intact, Dental Advisory Given   Pulmonary shortness of breath and with exertion, asthma , sleep apnea , former smoker,    Pulmonary exam normal breath sounds clear to auscultation       Cardiovascular negative cardio ROS Normal cardiovascular exam Rhythm:Regular Rate:Normal     Neuro/Psych  Headaches, PSYCHIATRIC DISORDERS Anxiety Depression    GI/Hepatic negative GI ROS, Neg liver ROS,   Endo/Other  negative endocrine ROSS/P Bilateral mastectomies  Renal/GU negative Renal ROS  negative genitourinary   Musculoskeletal  (+) Arthritis , Osteoarthritis,  Arthritis left ankle   Abdominal   Peds  Hematology  (+) anemia ,   Anesthesia Other Findings   Reproductive/Obstetrics                            Anesthesia Physical Anesthesia Plan  ASA: II  Anesthesia Plan: General   Post-op Pain Management:    Induction: Intravenous  Airway Management Planned: LMA and Oral ETT  Additional Equipment:   Intra-op Plan:   Post-operative Plan: Extubation in OR  Informed Consent: I have reviewed the patients History and Physical, chart, labs and discussed the procedure including the risks, benefits and alternatives for the proposed anesthesia with the patient or authorized representative who has indicated his/her understanding and acceptance.   Dental advisory given  Plan Discussed with: CRNA, Anesthesiologist and Surgeon  Anesthesia Plan Comments:        Anesthesia Quick Evaluation

## 2017-01-21 NOTE — Discharge Instructions (Signed)

## 2017-01-21 NOTE — Interval H&P Note (Signed)
History and Physical Interval Note:  01/21/2017 7:01 AM  [name]  has presented today for surgery, with the diagnosis of AQUIRED ABSENT BILATERAL BREAST  The various methods of treatment have been discussed with the patient and family. After consideration of risks, benefits and other options for treatment, the patient has consented to  Procedure(s): REMOVAL OF BILATERAL TISSUE EXPANDERS WITH PLACEMENT OF BILATERAL BREAST IMPLANTS (Bilateral) as a surgical intervention .  The patient's history has been reviewed, patient examined, no change in status, stable for surgery.  I have reviewed the patient's chart and labs.  Questions were answered to the patient's satisfaction.     [name]

## 2017-01-22 ENCOUNTER — Encounter (HOSPITAL_BASED_OUTPATIENT_CLINIC_OR_DEPARTMENT_OTHER): Payer: Self-pay | Admitting: Plastic Surgery

## 2017-01-22 DIAGNOSIS — Z79899 Other long term (current) drug therapy: Secondary | ICD-10-CM | POA: Diagnosis not present

## 2017-01-22 DIAGNOSIS — G4730 Sleep apnea, unspecified: Secondary | ICD-10-CM | POA: Diagnosis not present

## 2017-01-22 DIAGNOSIS — Z803 Family history of malignant neoplasm of breast: Secondary | ICD-10-CM | POA: Diagnosis not present

## 2017-01-22 DIAGNOSIS — Z9013 Acquired absence of bilateral breasts and nipples: Secondary | ICD-10-CM | POA: Diagnosis not present

## 2017-01-22 DIAGNOSIS — Z421 Encounter for breast reconstruction following mastectomy: Secondary | ICD-10-CM | POA: Diagnosis not present

## 2017-01-22 DIAGNOSIS — Z87891 Personal history of nicotine dependence: Secondary | ICD-10-CM | POA: Diagnosis not present

## 2017-01-22 DIAGNOSIS — Z981 Arthrodesis status: Secondary | ICD-10-CM | POA: Diagnosis not present

## 2017-02-19 ENCOUNTER — Telehealth (INDEPENDENT_AMBULATORY_CARE_PROVIDER_SITE_OTHER): Payer: Self-pay | Admitting: Orthopedic Surgery

## 2017-02-19 NOTE — Telephone Encounter (Signed)
Patient called checking on records request from Feb. I advised her that [employer] had refused due to form being incomplete.  I emailed patient a new release form to [email or web address], per her request, and she will complete in its entirety and return to me. I assured her that I would fax her records as soon as I get her valid release form back.

## 2017-02-24 ENCOUNTER — Telehealth (INDEPENDENT_AMBULATORY_CARE_PROVIDER_SITE_OTHER): Payer: Self-pay | Admitting: Orthopedic Surgery

## 2017-02-24 NOTE — Telephone Encounter (Signed)
RECORDS FAXED TO [facility] PER PTS SIGNED AUTHOIRZATION

## 2017-03-04 DIAGNOSIS — M79672 Pain in left foot: Secondary | ICD-10-CM | POA: Diagnosis not present

## 2017-03-12 DIAGNOSIS — R000 Tachycardia, unspecified: Secondary | ICD-10-CM | POA: Diagnosis not present

## 2017-03-17 DIAGNOSIS — Z981 Arthrodesis status: Secondary | ICD-10-CM | POA: Diagnosis not present

## 2017-03-17 DIAGNOSIS — Z8781 Personal history of (healed) traumatic fracture: Secondary | ICD-10-CM | POA: Diagnosis not present

## 2017-03-17 DIAGNOSIS — M85872 Other specified disorders of bone density and structure, left ankle and foot: Secondary | ICD-10-CM | POA: Diagnosis not present

## 2017-03-17 DIAGNOSIS — M19172 Post-traumatic osteoarthritis, left ankle and foot: Secondary | ICD-10-CM | POA: Diagnosis not present

## 2017-04-03 DIAGNOSIS — M19172 Post-traumatic osteoarthritis, left ankle and foot: Secondary | ICD-10-CM | POA: Diagnosis not present

## 2017-04-03 DIAGNOSIS — Z8781 Personal history of (healed) traumatic fracture: Secondary | ICD-10-CM | POA: Diagnosis not present

## 2017-04-08 DIAGNOSIS — E039 Hypothyroidism, unspecified: Secondary | ICD-10-CM | POA: Diagnosis not present

## 2017-04-08 DIAGNOSIS — R51 Headache: Secondary | ICD-10-CM | POA: Diagnosis not present

## 2017-04-13 ENCOUNTER — Encounter (HOSPITAL_COMMUNITY): Payer: Self-pay | Admitting: Nurse Practitioner

## 2017-04-13 ENCOUNTER — Emergency Department (HOSPITAL_COMMUNITY)
Admission: EM | Admit: 2017-04-13 | Discharge: 2017-04-13 | Disposition: A | Discharge status: Home / Self Care | Payer: Medicare Other | Attending: Emergency Medicine | Admitting: Emergency Medicine

## 2017-04-13 ENCOUNTER — Emergency Department (HOSPITAL_COMMUNITY): Payer: Medicare Other

## 2017-04-13 DIAGNOSIS — S99912A Unspecified injury of left ankle, initial encounter: Secondary | ICD-10-CM | POA: Diagnosis present

## 2017-04-13 DIAGNOSIS — X501XXA Overexertion from prolonged static or awkward postures, initial encounter: Secondary | ICD-10-CM | POA: Diagnosis not present

## 2017-04-13 DIAGNOSIS — Y929 Unspecified place or not applicable: Secondary | ICD-10-CM | POA: Diagnosis not present

## 2017-04-13 DIAGNOSIS — Y999 Unspecified external cause status: Secondary | ICD-10-CM | POA: Diagnosis not present

## 2017-04-13 DIAGNOSIS — Z87891 Personal history of nicotine dependence: Secondary | ICD-10-CM | POA: Diagnosis not present

## 2017-04-13 DIAGNOSIS — S93402A Sprain of unspecified ligament of left ankle, initial encounter: Secondary | ICD-10-CM

## 2017-04-13 DIAGNOSIS — G8911 Acute pain due to trauma: Secondary | ICD-10-CM | POA: Diagnosis not present

## 2017-04-13 DIAGNOSIS — Y939 Activity, unspecified: Secondary | ICD-10-CM | POA: Diagnosis not present

## 2017-04-13 DIAGNOSIS — M25572 Pain in left ankle and joints of left foot: Secondary | ICD-10-CM | POA: Diagnosis not present

## 2017-04-13 DIAGNOSIS — Z79899 Other long term (current) drug therapy: Secondary | ICD-10-CM | POA: Diagnosis not present

## 2017-04-13 DIAGNOSIS — J45909 Unspecified asthma, uncomplicated: Secondary | ICD-10-CM | POA: Insufficient documentation

## 2017-04-13 DIAGNOSIS — S86191A Other injury of other muscle(s) and tendon(s) of posterior muscle group at lower leg level, right leg, initial encounter: Secondary | ICD-10-CM | POA: Diagnosis not present

## 2017-04-13 MED ORDER — IBUPROFEN 200 MG PO TABS
600.0000 mg | ORAL_TABLET | Freq: Once | ORAL | Status: AC
  Administered 2017-04-13: 600 mg via ORAL
  Filled 2017-04-13: qty 3

## 2017-04-13 MED ORDER — HYDROCODONE-ACETAMINOPHEN 5-325 MG PO TABS
1.0000 | ORAL_TABLET | Freq: Once | ORAL | Status: AC
  Administered 2017-04-13: 1 via ORAL
  Filled 2017-04-13: qty 1

## 2017-04-13 MED ORDER — IBUPROFEN 600 MG PO TABS: 600.0000 mg | ORAL_TABLET | Freq: Four times a day (QID) | ORAL | 0 refills | Status: DC | PRN

## 2017-04-13 NOTE — ED Provider Notes (Signed)
[facility] DEPT Provider Note   CSN: [id number] Arrival date & time: 04/13/17  1812     History   Chief Complaint Chief Complaint  Patient presents with  . Ankle Injury    Left lateral    HPI [name] is a 41 y.o. female.  HPI  SUBJECTIVE: [name] is a 41 y.o. female who complains of inversion injury to the generalized ankle 3 hour(s) ago. Immediate symptoms: immediate pain, immediate swelling, inability to bear weight directly after injury, no deformity was noted by the patient. Symptoms have been acute and constant since that time. Prior history of related problems: no prior problems with this area in the past, previous surgery of ANKLE FUSION. There is pain and swelling at the lateral aspect of that ankle.   OBJECTIVE: She appears well, vital signs are normal. There is swelling and tenderness over the lateral malleolus. No tenderness over the medial aspect of the ankle. The fifth metatarsal is not tender. The ankle joint is intact without excessive opening on stressing. X-ray: no fracture or dislocation noted The rest of the foot, ankle and leg exam is normal.  ASSESSMENT: Ankle  sprain  PLAN: rest the injured area as much as practical, apply ice packs, elevate the injured limb, compressive bandage, splint dispensed and applied, crutches dispensed See orders for this visit as documented in the electronic medical record.   Past Medical History:  Diagnosis Date  . Anemia    no current med.  . Anxiety   . Arthritis    left ankle  . Complication of anesthesia    hx. of waking up during surgery  . Depression   . Migraines   . Seasonal asthma    no current med.  . Sleep apnea    mild per pt. - no CPAP use    Patient Active Problem List   Diagnosis Date Noted  . Acquired absence of breast and nipple 01/21/2017  . Fibrocystic breast changes, bilateral 10/29/2016  . Fibrocystic breast disease (FCBD) in female 04/17/2016  . Talus fracture 10/14/2014    . Preoperative clearance 04/12/2014  . Wound, surgical, infected 10/18/2013  . S/P laparoscopic assisted vaginal hysterectomy (LAVH) 01/21/2013  . Menorrhagia with regular cycle 01/20/2013  . S/P endometrial ablation 01/20/2013  . OBSTRUCTIVE SLEEP APNEA 07/20/2009  . ALLERGIC RHINITIS 07/20/2009  . ASTHMA 07/20/2009  . DYSPNEA 07/20/2009    Past Surgical History:  Procedure Laterality Date  . ANKLE FUSION Left 10/19/2014   Procedure: Tibiocalcaneal Fusion;  Surgeon: Newt Minion, MD;  Location: [facility];  Service: Orthopedics;  Laterality: Left;  . BILATERAL SALPINGECTOMY N/A 01/21/2013   Procedure: BILATERAL SALPINGECTOMY;  Surgeon: Thornell Sartorius, MD;  Location: [facility];  Service: Gynecology;  Laterality: N/A;  . BREAST RECONSTRUCTION WITH PLACEMENT OF TISSUE EXPANDER AND FLEX HD (ACELLULAR HYDRATED DERMIS) Bilateral 10/29/2016   Procedure: BILATERAL BREAST RECONSTRUCTION WITH PLACEMENT OF TISSUE EXPANDER AND  CORTIVA;  Surgeon: Irene Limbo, MD;  Location: [facility];  Service: Plastics;  Laterality: Bilateral;  . EXTERNAL FIXATION LEG Left 10/14/2014   Procedure: EXTERNAL FIXATION LEFT ANKLE;  Surgeon: Marianna Payment, MD;  Location: [facility];  Service: Orthopedics;  Laterality: Left;  . EXTERNAL FIXATION REMOVAL Left 10/19/2014   Procedure: REMOVAL EXTERNAL FIXATION LEG;  Surgeon: Newt Minion, MD;  Location: [facility];  Service: Orthopedics;  Laterality: Left;  . I&D EXTREMITY Left 10/14/2014   Procedure: IRRIGATION AND DEBRIDEMENT OPEN TIBIA FRACTURE LEFT LEG;  Surgeon: Marianna Payment, MD;  Location: [facility];  Service: Orthopedics;  Laterality: Left;  . LAPAROSCOPIC ASSISTED VAGINAL HYSTERECTOMY N/A 01/21/2013   Procedure: LAPAROSCOPIC ASSISTED VAGINAL HYSTERECTOMY;  Surgeon: Thornell Sartorius, MD;  Location: [facility];  Service: Gynecology;  Laterality: N/A;  . NIPPLE SPARING MASTECTOMY Bilateral 10/29/2016   Procedure: BILATERAL NIPPLE SPARING MASTECTOMIES;  Surgeon: Donnie Mesa, MD;  Location: [facility];  Service: General;  Laterality: Bilateral;  . NOVASURE ABLATION  03/13/2007  . REMOVAL OF BILATERAL TISSUE EXPANDERS WITH PLACEMENT OF BILATERAL BREAST IMPLANTS Bilateral 01/21/2017   Procedure: REMOVAL OF BILATERAL TISSUE EXPANDERS WITH PLACEMENT OF BILATERAL BREAST IMPLANTS;  Surgeon: Irene Limbo, MD;  Location: [facility];  Service: Plastics;  Laterality: Bilateral;  . THYROID LOBECTOMY Left 10/01/2013   Procedure: THYROID LOBECTOMY;  Surgeon: Madilyn Hook, DO;  Location: [facility];  Service: General;  Laterality: Left;  . TUBAL LIGATION      OB History    Gravida Para Term Preterm AB Living   2 1 1   1 1    SAB TAB Ectopic Multiple Live Births     1             Home Medications    Prior to Admission medications   Medication Sig Start Date End Date Taking? Authorizing Provider  desvenlafaxine (PRISTIQ) 50 MG 24 hr tablet Take 50 mg by mouth daily. 10/07/16   [provider]  ibuprofen (ADVIL,MOTRIN) 600 MG tablet Take 1 tablet (600 mg total) by mouth every 6 (six) hours as needed. 04/13/17   Varney Biles, MD  methocarbamol (ROBAXIN) 500 MG tablet Take 1 tablet (500 mg total) by mouth every 8 (eight) hours as needed for muscle spasms. 01/21/17   Irene Limbo, MD  oxyCODONE-acetaminophen (PERCOCET) 10-325 MG tablet Take 1 tablet by mouth every 4 (four) hours as needed for pain. 01/21/17   Irene Limbo, MD  QUEtiapine (SEROQUEL) 25 MG tablet Take 75 mg by mouth at bedtime. 10/07/16   [provider]  sulfamethoxazole-trimethoprim (BACTRIM DS,SEPTRA DS) 800-160 MG tablet Take 1 tablet by mouth 2 (two) times daily. 01/21/17   Irene Limbo, MD  traZODone (DESYREL) 100 MG tablet Take 100-200 mg by mouth at bedtime.     [provider]  VYVANSE 40 MG capsule Take 40 mg by mouth daily. 08/10/16   [provider]    Family History Family History  Problem Relation Age of Onset  . Depression Father     Social  History Social History  Substance Use Topics  . Smoking status: Former Smoker    Packs/day: 0.00    Quit date: 06/22/2016  . Smokeless tobacco: Never Used  . Alcohol use Yes     Comment: occasionally     Allergies   Shrimp [shellfish allergy] and Kiwi extract   Review of Systems Review of Systems  Constitutional: Positive for activity change.  Musculoskeletal: Positive for arthralgias, gait problem and myalgias.     Physical Exam Updated Vital Signs BP (!) 134/101 (BP Location: Left Arm)   Pulse (!) 105   Temp 98.2 F (36.8 C) (Oral)   Resp 16   Ht 5\' 6"  (1.676 m)   Wt 64 kg (141 lb)   LMP 01/07/2013   SpO2 100%   BMI 22.76 kg/m   Physical Exam  Constitutional: She is oriented to person, place, and time. She appears well-developed.  HENT:  Head: Normocephalic and atraumatic.  Eyes: EOM are normal.  Neck: Normal range of motion. Neck supple.  Cardiovascular: Normal  rate and intact distal pulses.   Pulmonary/Chest: Effort normal.  Abdominal: Bowel sounds are normal.  Musculoskeletal: She exhibits edema and tenderness. She exhibits no deformity.  Neurological: She is alert and oriented to person, place, and time.  Skin: Skin is warm and dry.  Nursing note and vitals reviewed.    ED Treatments / Results  Labs (all labs ordered are listed, but only abnormal results are displayed) Labs Reviewed - No data to display  EKG  EKG Interpretation None       Radiology Dg Ankle Complete Left  Result Date: 04/13/2017 CLINICAL DATA:  Stepped in mandible with ankle pain, initial encounter EXAM: LEFT ANKLE COMPLETE - 3+ VIEW COMPARISON:  09/11/2016 FINDINGS: Medullary rod is again noted extending through the calcaneus into the distal tibia. Multiple fixation screws are again seen and stable. Some very prostatic lucency is noted but stable. No acute fracture or dislocation is seen. No gross soft tissue abnormality is noted. IMPRESSION: Postsurgical changes.  No acute  abnormality is seen. Electronically Signed   By: Inez Catalina M.D.   On: 04/13/2017 19:01    Procedures Procedures (including critical care time)  Medications Ordered in ED Medications  ibuprofen (ADVIL,MOTRIN) tablet 600 mg (600 mg Oral Given 04/13/17 1921)  HYDROcodone-acetaminophen (NORCO/VICODIN) 5-325 MG per tablet 1 tablet (1 tablet Oral Given 04/13/17 1921)     Initial Impression / Assessment and Plan / ED Course  I have reviewed the triage vital signs and the nursing notes.  Pertinent labs & imaging results that were available during my care of the patient were reviewed by me and considered in my medical decision making (see chart for details).       Final Clinical Impressions(s) / ED Diagnoses   Final diagnoses:  Sprain of left ankle, unspecified ligament, initial encounter    New Prescriptions New Prescriptions   IBUPROFEN (ADVIL,MOTRIN) 600 MG TABLET    Take 1 tablet (600 mg total) by mouth every 6 (six) hours as needed.     Varney Biles, MD 04/13/17 2006

## 2017-04-13 NOTE — ED Triage Notes (Signed)
Pt is c/o left ankle injury and pain secondary to "twisting on a ground hole." Remarks on a chronic hx of arthritis, surgery and hardware on the affected ankle, no deformity or swelling noted, surgical scar noted, pulses palpable with obvious MSK compromise. Rates pain 10/10, describing it as spasming.

## 2017-04-13 NOTE — ED Notes (Signed)
Bed: XY72 Expected date:  Expected time:  Means of arrival:  Comments: 41 yo ankle injury

## 2017-04-22 NOTE — H&P (Signed)
Subjective:     Patient ID: [name] is a 41 y.o. female.  HPI   Nearly 3 months post op implant exchange. Since last visit has had consult with Dr. Cyril Mourning and plan for removal hardware ankle in August 2018. Also ER visit this past week for sprain ankle.  Presented to [facility] ED 03/2016 with c/o breast mass, reported she could not get MMG as she had no PCP. Subsequest establishment to GYN and referral for MMG/US. MMG and [place] benign, palpable mass cyst, breast density D. She has had history fibrocystic disease and prior aspirations/resections. 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 Lives with adult daughter. She quit smoking 06/2016  Prior 36 B, desires more lifted, "C" cup. Right mastectomy 453 g Left 346 g.      Objective:   Physical Exam  Cardiovascular: Normal rate, regular rhythm and normal heart sounds.   Pulmonary/Chest: Effort normal and breath sounds normal.   Chest: severe rippling superior pole bilateral, with inferior displacement implant soft SN to nipple R 20 L 20 Nipple to IMF R 9 L 9   Assessment:     Dense breasts, Family history breast ca S/p bilateral NSM, TE/ADM (Cortiva) reconstruction S/p silicone implant exchange    Plan:     Plan revision for additional acellular dermis including possible  upper poles as she has significant rippling. Satisfied with overall volume. Consider exchange to Soft Touch or highly cohesive gel given the severity rippling with Responsive gel. Plan overnight stay. Will use drains with acellular dermis placement. She would like to complete this prior to her orthopaedic surgery as she will have limited mobility and be NWB following this. Will try to schedule for this month.  Inspira Smooth Round Extra Projection 400 ml implants placed bilateral. REF SRX-400     Irene Limbo, MD [facility] Plastic &  Reconstructive Surgery [phone], pin [phone]

## 2017-04-24 ENCOUNTER — Encounter (HOSPITAL_BASED_OUTPATIENT_CLINIC_OR_DEPARTMENT_OTHER): Payer: Self-pay | Admitting: *Deleted

## 2017-04-24 NOTE — Progress Notes (Signed)
Boost drink given to friend who came to pick up for patient. Slip on drink states to drink by 0400, states she will make sure pt knows.

## 2017-04-29 ENCOUNTER — Ambulatory Visit (HOSPITAL_BASED_OUTPATIENT_CLINIC_OR_DEPARTMENT_OTHER)
Admission: RE | Admit: 2017-04-29 | Discharge: 2017-04-30 | Disposition: A | Discharge status: Home / Self Care | Payer: Medicare Other | Source: Ambulatory Visit | Attending: Plastic Surgery | Admitting: Plastic Surgery

## 2017-04-29 ENCOUNTER — Ambulatory Visit (HOSPITAL_BASED_OUTPATIENT_CLINIC_OR_DEPARTMENT_OTHER): Payer: Medicare Other | Admitting: Anesthesiology

## 2017-04-29 ENCOUNTER — Encounter (HOSPITAL_BASED_OUTPATIENT_CLINIC_OR_DEPARTMENT_OTHER): Payer: Self-pay | Admitting: *Deleted

## 2017-04-29 ENCOUNTER — Encounter (HOSPITAL_BASED_OUTPATIENT_CLINIC_OR_DEPARTMENT_OTHER)
Admission: RE | Disposition: A | Discharge status: Home / Self Care | Payer: Self-pay | Source: Ambulatory Visit | Attending: Plastic Surgery

## 2017-04-29 DIAGNOSIS — J45909 Unspecified asthma, uncomplicated: Secondary | ICD-10-CM | POA: Diagnosis not present

## 2017-04-29 DIAGNOSIS — N611 Abscess of the breast and nipple: Secondary | ICD-10-CM | POA: Diagnosis not present

## 2017-04-29 DIAGNOSIS — Y812 Prosthetic and other implants, materials and accessory general- and plastic-surgery devices associated with adverse incidents: Secondary | ICD-10-CM | POA: Insufficient documentation

## 2017-04-29 DIAGNOSIS — Z803 Family history of malignant neoplasm of breast: Secondary | ICD-10-CM | POA: Insufficient documentation

## 2017-04-29 DIAGNOSIS — Z87891 Personal history of nicotine dependence: Secondary | ICD-10-CM | POA: Diagnosis not present

## 2017-04-29 DIAGNOSIS — T8542XA Displacement of breast prosthesis and implant, initial encounter: Secondary | ICD-10-CM | POA: Insufficient documentation

## 2017-04-29 DIAGNOSIS — J309 Allergic rhinitis, unspecified: Secondary | ICD-10-CM | POA: Diagnosis not present

## 2017-04-29 DIAGNOSIS — D242 Benign neoplasm of left breast: Secondary | ICD-10-CM | POA: Insufficient documentation

## 2017-04-29 DIAGNOSIS — G4733 Obstructive sleep apnea (adult) (pediatric): Secondary | ICD-10-CM | POA: Diagnosis not present

## 2017-04-29 DIAGNOSIS — Y929 Unspecified place or not applicable: Secondary | ICD-10-CM | POA: Diagnosis not present

## 2017-04-29 DIAGNOSIS — Z9013 Acquired absence of bilateral breasts and nipples: Secondary | ICD-10-CM | POA: Insufficient documentation

## 2017-04-29 DIAGNOSIS — Z9010 Acquired absence of unspecified breast and nipple: Secondary | ICD-10-CM

## 2017-04-29 HISTORY — DX: Other specified postprocedural states: Z98.890

## 2017-04-29 HISTORY — PX: BREAST IMPLANT EXCHANGE: SHX6296

## 2017-04-29 HISTORY — DX: Other specified postprocedural states: R11.2

## 2017-04-29 SURGERY — REPLACEMENT, IMPLANT, BREAST
Anesthesia: General | Site: Breast | Laterality: Bilateral

## 2017-04-29 MED ORDER — KETOROLAC TROMETHAMINE 30 MG/ML IJ SOLN
30.0000 mg | Freq: Three times a day (TID) | INTRAMUSCULAR | Status: DC
  Administered 2017-04-29 (×2): 30 mg via INTRAVENOUS
  Filled 2017-04-29 (×2): qty 1

## 2017-04-29 MED ORDER — GABAPENTIN 300 MG PO CAPS
300.0000 mg | ORAL_CAPSULE | ORAL | Status: AC
  Administered 2017-04-29: 300 mg via ORAL

## 2017-04-29 MED ORDER — OXYCODONE-ACETAMINOPHEN 10-325 MG PO TABS: 1.0000 | ORAL_TABLET | ORAL | 0 refills | Status: DC | PRN

## 2017-04-29 MED ORDER — FENTANYL CITRATE (PF) 100 MCG/2ML IJ SOLN
INTRAMUSCULAR | Status: AC
  Filled 2017-04-29: qty 2

## 2017-04-29 MED ORDER — GABAPENTIN 300 MG PO CAPS
ORAL_CAPSULE | ORAL | Status: AC
  Filled 2017-04-29: qty 1

## 2017-04-29 MED ORDER — ONDANSETRON HCL 4 MG/2ML IJ SOLN
INTRAMUSCULAR | Status: DC | PRN
  Administered 2017-04-29: 4 mg via INTRAVENOUS

## 2017-04-29 MED ORDER — SUGAMMADEX SODIUM 200 MG/2ML IV SOLN
INTRAVENOUS | Status: DC | PRN
  Administered 2017-04-29: 200 mg via INTRAVENOUS

## 2017-04-29 MED ORDER — PROPOFOL 10 MG/ML IV BOLUS
INTRAVENOUS | Status: DC | PRN
  Administered 2017-04-29: 50 mg via INTRAVENOUS
  Administered 2017-04-29: 200 mg via INTRAVENOUS

## 2017-04-29 MED ORDER — CELECOXIB 200 MG PO CAPS
ORAL_CAPSULE | ORAL | Status: AC
  Filled 2017-04-29: qty 2

## 2017-04-29 MED ORDER — BUPIVACAINE-EPINEPHRINE (PF) 0.25% -1:200000 IJ SOLN
INTRAMUSCULAR | Status: AC
Start: 2017-04-29 — End: 2017-04-29
  Filled 2017-04-29: qty 30

## 2017-04-29 MED ORDER — ACETAMINOPHEN 500 MG PO TABS
1000.0000 mg | ORAL_TABLET | ORAL | Status: AC
  Administered 2017-04-29: 1000 mg via ORAL

## 2017-04-29 MED ORDER — ONDANSETRON 4 MG PO TBDP
4.0000 mg | ORAL_TABLET | Freq: Four times a day (QID) | ORAL | Status: DC | PRN
  Administered 2017-04-29 – 2017-04-30 (×2): 4 mg via ORAL
  Filled 2017-04-29 (×2): qty 1

## 2017-04-29 MED ORDER — SCOPOLAMINE 1 MG/3DAYS TD PT72
MEDICATED_PATCH | TRANSDERMAL | Status: AC
  Filled 2017-04-29: qty 1

## 2017-04-29 MED ORDER — SCOPOLAMINE 1 MG/3DAYS TD PT72
MEDICATED_PATCH | TRANSDERMAL | Status: DC | PRN
  Administered 2017-04-29: 1 via TRANSDERMAL

## 2017-04-29 MED ORDER — EPHEDRINE SULFATE 50 MG/ML IJ SOLN
INTRAMUSCULAR | Status: DC | PRN
  Administered 2017-04-29: 10 mg via INTRAVENOUS

## 2017-04-29 MED ORDER — CEFAZOLIN SODIUM-DEXTROSE 2-4 GM/100ML-% IV SOLN
2.0000 g | INTRAVENOUS | Status: AC
  Administered 2017-04-29: 2 g via INTRAVENOUS

## 2017-04-29 MED ORDER — CHLORHEXIDINE GLUCONATE CLOTH 2 % EX PADS: 6.0000 | MEDICATED_PAD | Freq: Once | CUTANEOUS | Status: DC

## 2017-04-29 MED ORDER — PHENYLEPHRINE 40 MCG/ML (10ML) SYRINGE FOR IV PUSH (FOR BLOOD PRESSURE SUPPORT)
PREFILLED_SYRINGE | INTRAVENOUS | Status: AC
  Filled 2017-04-29: qty 10

## 2017-04-29 MED ORDER — OXYCODONE-ACETAMINOPHEN 5-325 MG PO TABS
1.0000 | ORAL_TABLET | ORAL | Status: DC | PRN
  Administered 2017-04-29 (×2): 2 via ORAL
  Filled 2017-04-29 (×2): qty 2

## 2017-04-29 MED ORDER — CELECOXIB 400 MG PO CAPS
400.0000 mg | ORAL_CAPSULE | ORAL | Status: AC
  Administered 2017-04-29: 400 mg via ORAL

## 2017-04-29 MED ORDER — DESVENLAFAXINE SUCCINATE ER 50 MG PO TB24: 50.0000 mg | ORAL_TABLET | Freq: Every day | ORAL | Status: DC

## 2017-04-29 MED ORDER — SCOPOLAMINE 1 MG/3DAYS TD PT72
1.0000 | MEDICATED_PATCH | Freq: Once | TRANSDERMAL | Status: DC | PRN
  Administered 2017-04-29: 1.5 mg via TRANSDERMAL

## 2017-04-29 MED ORDER — EPHEDRINE 5 MG/ML INJ
INTRAVENOUS | Status: AC
  Filled 2017-04-29: qty 10

## 2017-04-29 MED ORDER — PROPOFOL 10 MG/ML IV BOLUS
INTRAVENOUS | Status: AC
  Filled 2017-04-29: qty 20

## 2017-04-29 MED ORDER — LIDOCAINE 2% (20 MG/ML) 5 ML SYRINGE
INTRAMUSCULAR | Status: AC
  Filled 2017-04-29: qty 5

## 2017-04-29 MED ORDER — KCL IN DEXTROSE-NACL 20-5-0.9 MEQ/L-%-% IV SOLN: INTRAVENOUS | Status: DC

## 2017-04-29 MED ORDER — ACETAMINOPHEN 500 MG PO TABS
ORAL_TABLET | ORAL | Status: AC
  Filled 2017-04-29: qty 2

## 2017-04-29 MED ORDER — QUETIAPINE FUMARATE 50 MG PO TABS: 75.0000 mg | ORAL_TABLET | Freq: Every day | ORAL | Status: DC

## 2017-04-29 MED ORDER — SUGAMMADEX SODIUM 200 MG/2ML IV SOLN
INTRAVENOUS | Status: AC
  Filled 2017-04-29: qty 2

## 2017-04-29 MED ORDER — HYDROMORPHONE HCL 1 MG/ML IJ SOLN: 0.5000 mg | INTRAMUSCULAR | Status: DC | PRN

## 2017-04-29 MED ORDER — LACTATED RINGERS IV SOLN
INTRAVENOUS | Status: DC
  Administered 2017-04-29 (×3): via INTRAVENOUS

## 2017-04-29 MED ORDER — SULFAMETHOXAZOLE-TRIMETHOPRIM 800-160 MG PO TABS: 1.0000 | ORAL_TABLET | Freq: Two times a day (BID) | ORAL | 0 refills | Status: DC

## 2017-04-29 MED ORDER — MIDAZOLAM HCL 5 MG/5ML IJ SOLN
INTRAMUSCULAR | Status: DC | PRN
  Administered 2017-04-29: 2 mg via INTRAVENOUS

## 2017-04-29 MED ORDER — FENTANYL CITRATE (PF) 100 MCG/2ML IJ SOLN
25.0000 ug | INTRAMUSCULAR | Status: DC | PRN
  Administered 2017-04-29: 50 ug via INTRAVENOUS
  Administered 2017-04-29: 25 ug via INTRAVENOUS

## 2017-04-29 MED ORDER — TRAZODONE HCL 100 MG PO TABS: 100.0000 mg | ORAL_TABLET | Freq: Every day | ORAL | Status: DC

## 2017-04-29 MED ORDER — MIDAZOLAM HCL 2 MG/2ML IJ SOLN: 1.0000 mg | INTRAMUSCULAR | Status: DC | PRN

## 2017-04-29 MED ORDER — DIPHENHYDRAMINE HCL 50 MG/ML IJ SOLN: 25.0000 mg | Freq: Four times a day (QID) | INTRAMUSCULAR | Status: DC | PRN

## 2017-04-29 MED ORDER — LIDOCAINE HCL (CARDIAC) 20 MG/ML IV SOLN
INTRAVENOUS | Status: DC | PRN
  Administered 2017-04-29: 30 mg via INTRAVENOUS

## 2017-04-29 MED ORDER — CEFAZOLIN SODIUM-DEXTROSE 1-4 GM/50ML-% IV SOLN
1.0000 g | Freq: Three times a day (TID) | INTRAVENOUS | Status: DC
  Administered 2017-04-29 (×2): 1 g via INTRAVENOUS
  Filled 2017-04-29 (×2): qty 50

## 2017-04-29 MED ORDER — FENTANYL CITRATE (PF) 100 MCG/2ML IJ SOLN
INTRAMUSCULAR | Status: DC | PRN
  Administered 2017-04-29 (×2): 25 ug via INTRAVENOUS
  Administered 2017-04-29: 100 ug via INTRAVENOUS
  Administered 2017-04-29 (×3): 25 ug via INTRAVENOUS

## 2017-04-29 MED ORDER — DEXAMETHASONE SODIUM PHOSPHATE 10 MG/ML IJ SOLN
INTRAMUSCULAR | Status: AC
  Filled 2017-04-29: qty 1

## 2017-04-29 MED ORDER — ROCURONIUM BROMIDE 100 MG/10ML IV SOLN
INTRAVENOUS | Status: DC | PRN
  Administered 2017-04-29: 20 mg via INTRAVENOUS
  Administered 2017-04-29: 50 mg via INTRAVENOUS

## 2017-04-29 MED ORDER — PHENYLEPHRINE HCL 10 MG/ML IJ SOLN
INTRAMUSCULAR | Status: DC | PRN
  Administered 2017-04-29 (×5): 80 ug via INTRAVENOUS

## 2017-04-29 MED ORDER — LISDEXAMFETAMINE DIMESYLATE 20 MG PO CAPS: 40.0000 mg | ORAL_CAPSULE | Freq: Every day | ORAL | Status: DC

## 2017-04-29 MED ORDER — FENTANYL CITRATE (PF) 100 MCG/2ML IJ SOLN: 50.0000 ug | INTRAMUSCULAR | Status: DC | PRN

## 2017-04-29 MED ORDER — MIDAZOLAM HCL 2 MG/2ML IJ SOLN
INTRAMUSCULAR | Status: AC
  Filled 2017-04-29: qty 2

## 2017-04-29 MED ORDER — DEXAMETHASONE SODIUM PHOSPHATE 4 MG/ML IJ SOLN
INTRAMUSCULAR | Status: DC | PRN
  Administered 2017-04-29: 10 mg via INTRAVENOUS

## 2017-04-29 MED ORDER — KCL IN DEXTROSE-NACL 20-5-0.45 MEQ/L-%-% IV SOLN
INTRAVENOUS | Status: DC
  Administered 2017-04-29: 12:00:00 via INTRAVENOUS
  Filled 2017-04-29: qty 1000

## 2017-04-29 MED ORDER — ONDANSETRON HCL 4 MG/2ML IJ SOLN: 4.0000 mg | Freq: Four times a day (QID) | INTRAMUSCULAR | Status: DC | PRN

## 2017-04-29 MED ORDER — CEFAZOLIN SODIUM-DEXTROSE 2-4 GM/100ML-% IV SOLN
INTRAVENOUS | Status: AC
  Filled 2017-04-29: qty 100

## 2017-04-29 MED ORDER — ONDANSETRON HCL 4 MG/2ML IJ SOLN
INTRAMUSCULAR | Status: AC
  Filled 2017-04-29: qty 2

## 2017-04-29 MED ORDER — SODIUM CHLORIDE 0.9 % IV SOLN
INTRAVENOUS | Status: DC | PRN
  Administered 2017-04-29: 1000 mL

## 2017-04-29 MED ORDER — LAMOTRIGINE 100 MG PO TABS: 100.0000 mg | ORAL_TABLET | Freq: Every day | ORAL | Status: DC

## 2017-04-29 MED ORDER — DIPHENHYDRAMINE HCL 25 MG PO CAPS: 25.0000 mg | ORAL_CAPSULE | Freq: Four times a day (QID) | ORAL | Status: DC | PRN

## 2017-04-29 SURGICAL SUPPLY — 71 items
ALLODERM 8X16 MED THICK (Tissue) ×3 IMPLANT
ALLODERM 8X16 READY TO USE (Tissue) ×2 IMPLANT
ALLODERM RTU 8X16 (Tissue) ×1 IMPLANT
BAG DECANTER FOR FLEXI CONT (MISCELLANEOUS) ×3 IMPLANT
BINDER BREAST LRG (GAUZE/BANDAGES/DRESSINGS) ×2 IMPLANT
BINDER BREAST MEDIUM (GAUZE/BANDAGES/DRESSINGS) IMPLANT
BINDER BREAST XLRG (GAUZE/BANDAGES/DRESSINGS) IMPLANT
BINDER BREAST XXLRG (GAUZE/BANDAGES/DRESSINGS) IMPLANT
BLADE SURG 10 STRL SS (BLADE) ×5 IMPLANT
BNDG GAUZE ELAST 4 BULKY (GAUZE/BANDAGES/DRESSINGS) ×6 IMPLANT
CANISTER SUCT 1200ML W/VALVE (MISCELLANEOUS) ×5 IMPLANT
CHLORAPREP W/TINT 26ML (MISCELLANEOUS) ×5 IMPLANT
COVER BACK TABLE 60X90IN (DRAPES) ×3 IMPLANT
COVER MAYO STAND STRL (DRAPES) ×3 IMPLANT
DECANTER SPIKE VIAL GLASS SM (MISCELLANEOUS) IMPLANT
DRAIN CHANNEL 15F RND FF W/TCR (WOUND CARE) ×5 IMPLANT
DRAPE INCISE IOBAN 66X45 STRL (DRAPES) IMPLANT
DRAPE TOP ARMCOVERS (MISCELLANEOUS) ×3 IMPLANT
DRAPE U-SHAPE 76X120 STRL (DRAPES) ×3 IMPLANT
DRAPE UTILITY XL STRL (DRAPES) IMPLANT
DRSG PAD ABDOMINAL 8X10 ST (GAUZE/BANDAGES/DRESSINGS) ×10 IMPLANT
ELECT BLADE 4.0 EZ CLEAN MEGAD (MISCELLANEOUS) ×3
ELECT COATED BLADE 2.86 ST (ELECTRODE) ×3 IMPLANT
ELECT REM PT RETURN 9FT ADLT (ELECTROSURGICAL) ×3
ELECTRODE BLDE 4.0 EZ CLN MEGD (MISCELLANEOUS) ×1 IMPLANT
ELECTRODE REM PT RTRN 9FT ADLT (ELECTROSURGICAL) ×1 IMPLANT
EVACUATOR SILICONE 100CC (DRAIN) ×4 IMPLANT
GLOVE BIO SURGEON STRL SZ 6 (GLOVE) ×7 IMPLANT
GLOVE BIOGEL PI IND STRL 7.0 (GLOVE) IMPLANT
GLOVE BIOGEL PI INDICATOR 7.0 (GLOVE) ×6
GLOVE ECLIPSE 6.5 STRL STRAW (GLOVE) ×6 IMPLANT
GLOVE EXAM NITRILE EXT CUFF MD (GLOVE) ×2 IMPLANT
GOWN STRL REUS W/ TWL LRG LVL3 (GOWN DISPOSABLE) ×2 IMPLANT
GOWN STRL REUS W/TWL LRG LVL3 (GOWN DISPOSABLE) ×12
IMPL BREAST XFULL SHL 400 (Breast) IMPLANT
IMPL BRST XFULL SHL 400CC (Breast) ×2 IMPLANT
IMPLANT BREAST GEL 400CC (Breast) ×6 IMPLANT
IV NS 500ML (IV SOLUTION)
IV NS 500ML BAXH (IV SOLUTION) ×1 IMPLANT
KIT FILL SYSTEM UNIVERSAL (SET/KITS/TRAYS/PACK) IMPLANT
LIQUID BAND (GAUZE/BANDAGES/DRESSINGS) ×5 IMPLANT
MARKER SKIN DUAL TIP RULER LAB (MISCELLANEOUS) ×2 IMPLANT
NDL HYPO 25X1 1.5 SAFETY (NEEDLE) IMPLANT
NEEDLE HYPO 25X1 1.5 SAFETY (NEEDLE) IMPLANT
PACK BASIN DAY SURGERY FS (CUSTOM PROCEDURE TRAY) ×3 IMPLANT
PENCIL BUTTON HOLSTER BLD 10FT (ELECTRODE) ×3 IMPLANT
PIN SAFETY STERILE (MISCELLANEOUS) ×3 IMPLANT
SHEET MEDIUM DRAPE 40X70 STRL (DRAPES) ×3 IMPLANT
SLEEVE SCD COMPRESS KNEE MED (MISCELLANEOUS) ×3 IMPLANT
SPONGE LAP 18X18 X RAY DECT (DISPOSABLE) ×6 IMPLANT
STAPLER VISISTAT 35W (STAPLE) ×3 IMPLANT
SUT ETHILON 2 0 FS 18 (SUTURE) ×4 IMPLANT
SUT MNCRL AB 4-0 PS2 18 (SUTURE) ×4 IMPLANT
SUT PDS 3-0 CT2 (SUTURE)
SUT PDS AB 2-0 CT2 27 (SUTURE) ×12 IMPLANT
SUT PDS II 3-0 CT2 27 ABS (SUTURE) IMPLANT
SUT VIC AB 3-0 PS1 18 (SUTURE) ×3
SUT VIC AB 3-0 PS1 18XBRD (SUTURE) IMPLANT
SUT VIC AB 3-0 SH 27 (SUTURE) ×6
SUT VIC AB 3-0 SH 27X BRD (SUTURE) ×1 IMPLANT
SUT VICRYL 4-0 PS2 18IN ABS (SUTURE) ×4 IMPLANT
SYR 50ML LL SCALE MARK (SYRINGE) IMPLANT
SYR BULB IRRIGATION 50ML (SYRINGE) ×5 IMPLANT
SYR CONTROL 10ML LL (SYRINGE) IMPLANT
TISSUE ALLDRM 8X16 MED THICK (Tissue) IMPLANT
TISSUE ALLDRM RTU 8X16 (Tissue) IMPLANT
TOWEL OR 17X24 6PK STRL BLUE (TOWEL DISPOSABLE) ×6 IMPLANT
TUBE CONNECTING 20'X1/4 (TUBING) ×2
TUBE CONNECTING 20X1/4 (TUBING) ×3 IMPLANT
UNDERPAD 30X30 (UNDERPADS AND DIAPERS) ×6 IMPLANT
YANKAUER SUCT BULB TIP NO VENT (SUCTIONS) ×3 IMPLANT

## 2017-04-29 NOTE — Transfer of Care (Signed)
Immediate Anesthesia Transfer of Care Note  Patient: [name]  Procedure(s) Performed: Procedure(s): REVISION OF BILATERAL RECONSTRUCTION WITH SILICONE IMPLANT EXCHANGE AND ALLODERM BILATERAL BREAST (Bilateral)  Patient Location: PACU  Anesthesia Type:General  Level of Consciousness: sedated  Airway & Oxygen Therapy: Patient Spontanous Breathing and Patient connected to face mask oxygen  Post-op Assessment: Report given to RN and Post -op Vital signs reviewed and stable  Post vital signs: Reviewed and stable  Last Vitals:  Vitals:   04/29/17 0635  BP: 113/70  Pulse: 87  Resp: 20  Temp: 36.7 C    Last Pain:  Vitals:   04/29/17 0635  TempSrc: Oral  PainSc: 8          Complications: No apparent anesthesia complications

## 2017-04-29 NOTE — Anesthesia Preprocedure Evaluation (Signed)
Anesthesia Evaluation  Patient identified by MRN, date of birth, ID band Patient awake    Reviewed: Allergy & Precautions, NPO status , Patient's Chart, lab work & pertinent test results  History of Anesthesia Complications (+) PONV  Airway Mallampati: II  TM Distance: >3 FB     Dental   Pulmonary shortness of breath, asthma , sleep apnea , former smoker,    breath sounds clear to auscultation       Cardiovascular negative cardio ROS   Rhythm:Regular Rate:Normal     Neuro/Psych    GI/Hepatic negative GI ROS, Neg liver ROS,   Endo/Other  negative endocrine ROS  Renal/GU negative Renal ROS     Musculoskeletal  (+) Arthritis ,   Abdominal   Peds  Hematology   Anesthesia Other Findings   Reproductive/Obstetrics                             Anesthesia Physical Anesthesia Plan  ASA: III  Anesthesia Plan: General   Post-op Pain Management:    Induction: Intravenous  PONV Risk Score and Plan: 3 and Ondansetron, Dexamethasone, Propofol and Midazolam  Airway Management Planned: Oral ETT  Additional Equipment:   Intra-op Plan:   Post-operative Plan: Extubation in OR  Informed Consent: I have reviewed the patients History and Physical, chart, labs and discussed the procedure including the risks, benefits and alternatives for the proposed anesthesia with the patient or authorized representative who has indicated his/her understanding and acceptance.     Plan Discussed with: CRNA and Anesthesiologist  Anesthesia Plan Comments:         Anesthesia Quick Evaluation

## 2017-04-29 NOTE — Interval H&P Note (Signed)
History and Physical Interval Note:  04/29/2017 6:48 AM  [name]  has presented today for surgery, with the diagnosis of ACQUIRED ABSENCE OF BILATERAL BREAST  The various methods of treatment have been discussed with the patient and family. After consideration of risks, benefits and other options for treatment, the patient has consented to  Procedure(s): REVISION OF BILATERAL RECONSTRUCTION WITH SILICONE IMPLANT EXCHANGE AND ALLODERM (Bilateral) as a surgical intervention .  The patient's history has been reviewed, patient examined, no change in status, stable for surgery.  I have reviewed the patient's chart and labs.  Questions were answered to the patient's satisfaction.     [name]

## 2017-04-29 NOTE — Op Note (Signed)
Operative Note   DATE OF OPERATION: 6.19.18  LOCATION: [facility]-observation  SURGICAL DIVISION: Plastic Surgery  PREOPERATIVE DIAGNOSES:  1. History dense breasts 2. Acquired absence breasts  POSTOPERATIVE DIAGNOSES:  same  PROCEDURE:  1. Revision bilateral breast reconstruction with silicon implant exchange 2. Acellular dermis for breast reconstruction (Alloderm) 240 cm2  SURGEON: Irene Limbo MD MBA  ASSISTANT: none  ANESTHESIA:  General.   EBL: 15 ml  COMPLICATIONS: None immediate.   INDICATIONS FOR PROCEDURE:  The patient, [name], is a 41 y.o. female born on 02/26/1976, is here for revision breast reconstruction. She underwent bilateral implant based reconstruction following nipple sparing mastectomies. She has developed severe rippling as well as some inferior and lateral displacement.   FINDINGS: Intact Natrelle Inspira implants removed. Natrelle Inspira Smooth Round Cohesive gel implants 400 ml placed bilateral. REF SCX-400 RIGHT SN [id number] LEFT SN [id number]  DESCRIPTION OF PROCEDURE:  The patient's operative site was marked with the patient in the preoperative area to mark sternal notch, desired anterior axillary lines, chest midline. The patientwas taken to the operating room. SCDs were placed and IV antibiotics were given. The patient's operative site was prepped and draped in a sterile fashion. A time out was performed and all information was confirmed to be correct.A nipple to inframammary fold distance was marked bilateral. The prior IMF scar to area marked was deepithelialized bilateral as part of "smile" mastopexy. Incision made in left inframammary fold and carried through superficial fascia and implant capsule. Intact implant removed.  Acellular dermis perforated and inset to chest wall with 2-0 PDS at medial to desired anterior axillary line and superior to desired inframammary fold. The ADM was then redraped over prior implant and sutured to  anterior capsule with 2-0 PDS. Additional plication suture placed with interrupted 2-0 PDS to chest wall and anterior mastectomy flap capsule. 15 Fr JP placed and secured with 2-0 nylon.  I then directed attention to right chest. Similar deepithelization of skin at inframammary fold scar completed prior to incision. Capsule entered and intact implant removed.  Acellular dermis perforated and inset to chest wall with 2-0 PDS at medial to desired anterior axillary line and superior to inframammary fold. The ADM was then redraped over prior implant and sutured to anterior capsule with 2-0 PDS. Additional plication suture placed with interrupted 2-0 PDS to chest wall and anterior mastectomy flap capsule. Total 240 cm2 ADM utilized for reinforcement bilateral. 15 Fr JP placed and secured with 2-0 nylon.  At this time breast cavities irrigated with solution containing Ancef, gentamicin, and bacitracin, followed by Betadine. Implant placed in right breast cavity. Care taken to ensure proper orientation. Closure was completed with interrupted 3-0 vicryl for approximation of capsule and superficial fascia and chest wall in three point fashion. 4-0 vicryl was placed in dermis and running 4-0 monocryl was used to close skin.Overleft breast following implant placement, closure completed with similar 3 point suture to stabilize inframammary fold. 4-0 vicryl was placed in dermis and running 4-0 monocryl was used to close skin.  Tissue adhesive applied to breast incisions. Dry dressing and breast binder applied  The patient was allowed to wake from anesthesia, extubated and taken to the recovery room in satisfactory condition.   SPECIMENS: none  DRAINS: 15 Fr JP in right and left breast reconstruction  Irene Limbo, MD [facility] Plastic & Reconstructive Surgery [phone], pin [phone]

## 2017-04-29 NOTE — Anesthesia Postprocedure Evaluation (Signed)
Anesthesia Post Note  Patient: [name]  Procedure(s) Performed: Procedure(s) (LRB): REVISION OF BILATERAL RECONSTRUCTION WITH SILICONE IMPLANT EXCHANGE AND ALLODERM BILATERAL BREAST (Bilateral)     Patient location during evaluation: PACU Anesthesia Type: General Level of consciousness: awake Pain management: pain level controlled Vital Signs Assessment: post-procedure vital signs reviewed and stable Respiratory status: spontaneous breathing Cardiovascular status: stable Anesthetic complications: no    Last Vitals:  Vitals:   04/29/17 1010 04/29/17 1015  BP: 132/83   Pulse:    Resp:    Temp:  36.6 C    Last Pain:  Vitals:   04/29/17 1015  TempSrc:   PainSc: 0-No pain                 Winn Muehl

## 2017-04-29 NOTE — Anesthesia Procedure Notes (Signed)
Procedure Name: Intubation Date/Time: 04/29/2017 7:35 AM Performed by: Marrianne Mood Pre-anesthesia Checklist: Patient identified, Emergency Drugs available, Suction available, Patient being monitored and Timeout performed Patient Re-evaluated:Patient Re-evaluated prior to inductionOxygen Delivery Method: Circle system utilized Preoxygenation: Pre-oxygenation with 100% oxygen Intubation Type: IV induction Ventilation: Mask ventilation without difficulty Laryngoscope Size: Miller and 3 Grade View: Grade III Tube type: Oral Tube size: 7.0 mm Number of attempts: 1 Airway Equipment and Method: Stylet and Oral airway Placement Confirmation: ETT inserted through vocal cords under direct vision,  positive ETCO2 and breath sounds checked- equal and bilateral Secured at: 22 cm Tube secured with: Tape Dental Injury: Teeth and Oropharynx as per pre-operative assessment

## 2017-04-30 ENCOUNTER — Encounter (HOSPITAL_BASED_OUTPATIENT_CLINIC_OR_DEPARTMENT_OTHER): Payer: Self-pay | Admitting: Plastic Surgery

## 2017-04-30 DIAGNOSIS — Z9013 Acquired absence of bilateral breasts and nipples: Secondary | ICD-10-CM | POA: Diagnosis not present

## 2017-04-30 DIAGNOSIS — T8542XA Displacement of breast prosthesis and implant, initial encounter: Secondary | ICD-10-CM | POA: Diagnosis not present

## 2017-04-30 DIAGNOSIS — D242 Benign neoplasm of left breast: Secondary | ICD-10-CM | POA: Diagnosis not present

## 2017-04-30 DIAGNOSIS — Z803 Family history of malignant neoplasm of breast: Secondary | ICD-10-CM | POA: Diagnosis not present

## 2017-04-30 DIAGNOSIS — Z87891 Personal history of nicotine dependence: Secondary | ICD-10-CM | POA: Diagnosis not present

## 2017-04-30 NOTE — Discharge Instructions (Signed)

## 2017-05-07 DIAGNOSIS — Z9013 Acquired absence of bilateral breasts and nipples: Secondary | ICD-10-CM | POA: Diagnosis not present

## 2017-05-07 DIAGNOSIS — Z86018 Personal history of other benign neoplasm: Secondary | ICD-10-CM | POA: Diagnosis not present

## 2017-05-07 DIAGNOSIS — L7634 Postprocedural seroma of skin and subcutaneous tissue following other procedure: Secondary | ICD-10-CM | POA: Diagnosis not present

## 2017-06-16 DIAGNOSIS — M19172 Post-traumatic osteoarthritis, left ankle and foot: Secondary | ICD-10-CM | POA: Diagnosis not present

## 2017-06-16 DIAGNOSIS — Z981 Arthrodesis status: Secondary | ICD-10-CM | POA: Diagnosis not present

## 2017-06-16 DIAGNOSIS — E785 Hyperlipidemia, unspecified: Secondary | ICD-10-CM | POA: Diagnosis not present

## 2017-06-16 DIAGNOSIS — Z472 Encounter for removal of internal fixation device: Secondary | ICD-10-CM | POA: Diagnosis not present

## 2017-06-16 DIAGNOSIS — T8489XA Other specified complication of internal orthopedic prosthetic devices, implants and grafts, initial encounter: Secondary | ICD-10-CM | POA: Diagnosis not present

## 2017-06-16 DIAGNOSIS — J45909 Unspecified asthma, uncomplicated: Secondary | ICD-10-CM | POA: Diagnosis not present

## 2017-06-16 DIAGNOSIS — G8918 Other acute postprocedural pain: Secondary | ICD-10-CM | POA: Diagnosis not present

## 2017-06-16 DIAGNOSIS — G8929 Other chronic pain: Secondary | ICD-10-CM | POA: Diagnosis not present

## 2017-06-16 DIAGNOSIS — Z87891 Personal history of nicotine dependence: Secondary | ICD-10-CM | POA: Diagnosis not present

## 2017-06-17 DIAGNOSIS — Z87891 Personal history of nicotine dependence: Secondary | ICD-10-CM | POA: Diagnosis not present

## 2017-06-17 DIAGNOSIS — J45909 Unspecified asthma, uncomplicated: Secondary | ICD-10-CM | POA: Diagnosis not present

## 2017-06-17 DIAGNOSIS — G8929 Other chronic pain: Secondary | ICD-10-CM | POA: Diagnosis not present

## 2017-06-17 DIAGNOSIS — M25572 Pain in left ankle and joints of left foot: Secondary | ICD-10-CM | POA: Diagnosis not present

## 2017-06-17 DIAGNOSIS — G8918 Other acute postprocedural pain: Secondary | ICD-10-CM | POA: Diagnosis not present

## 2017-06-17 DIAGNOSIS — E785 Hyperlipidemia, unspecified: Secondary | ICD-10-CM | POA: Diagnosis not present

## 2017-06-17 DIAGNOSIS — T8489XA Other specified complication of internal orthopedic prosthetic devices, implants and grafts, initial encounter: Secondary | ICD-10-CM | POA: Diagnosis not present

## 2017-07-02 DIAGNOSIS — Z7689 Persons encountering health services in other specified circumstances: Secondary | ICD-10-CM | POA: Diagnosis not present

## 2017-07-02 DIAGNOSIS — Z7409 Other reduced mobility: Secondary | ICD-10-CM | POA: Diagnosis not present

## 2017-07-02 DIAGNOSIS — M19172 Post-traumatic osteoarthritis, left ankle and foot: Secondary | ICD-10-CM | POA: Diagnosis not present

## 2017-07-02 DIAGNOSIS — Z09 Encounter for follow-up examination after completed treatment for conditions other than malignant neoplasm: Secondary | ICD-10-CM | POA: Diagnosis not present

## 2017-07-16 ENCOUNTER — Observation Stay (HOSPITAL_COMMUNITY): Payer: Medicare Other | Admitting: Certified Registered Nurse Anesthetist

## 2017-07-16 ENCOUNTER — Observation Stay (HOSPITAL_COMMUNITY)
Admission: AD | Admit: 2017-07-16 | Discharge: 2017-07-17 | Disposition: A | Discharge status: Home / Self Care | Payer: Medicare Other | Source: Ambulatory Visit | Attending: Plastic Surgery | Admitting: Plastic Surgery

## 2017-07-16 ENCOUNTER — Encounter (HOSPITAL_COMMUNITY)
Admission: AD | Disposition: A | Discharge status: Home / Self Care | Payer: Self-pay | Source: Ambulatory Visit | Attending: Plastic Surgery

## 2017-07-16 ENCOUNTER — Encounter (HOSPITAL_COMMUNITY): Payer: Self-pay | Admitting: General Practice

## 2017-07-16 DIAGNOSIS — T8549XA Other mechanical complication of breast prosthesis and implant, initial encounter: Secondary | ICD-10-CM | POA: Diagnosis not present

## 2017-07-16 DIAGNOSIS — Y9289 Other specified places as the place of occurrence of the external cause: Secondary | ICD-10-CM | POA: Diagnosis not present

## 2017-07-16 DIAGNOSIS — Z9013 Acquired absence of bilateral breasts and nipples: Secondary | ICD-10-CM | POA: Diagnosis not present

## 2017-07-16 DIAGNOSIS — L98499 Non-pressure chronic ulcer of skin of other sites with unspecified severity: Secondary | ICD-10-CM | POA: Diagnosis not present

## 2017-07-16 DIAGNOSIS — J45909 Unspecified asthma, uncomplicated: Secondary | ICD-10-CM | POA: Diagnosis not present

## 2017-07-16 DIAGNOSIS — Z9010 Acquired absence of unspecified breast and nipple: Secondary | ICD-10-CM

## 2017-07-16 DIAGNOSIS — T8542XA Displacement of breast prosthesis and implant, initial encounter: Principal | ICD-10-CM | POA: Insufficient documentation

## 2017-07-16 DIAGNOSIS — N611 Abscess of the breast and nipple: Secondary | ICD-10-CM | POA: Diagnosis not present

## 2017-07-16 DIAGNOSIS — Y831 Surgical operation with implant of artificial internal device as the cause of abnormal reaction of the patient, or of later complication, without mention of misadventure at the time of the procedure: Secondary | ICD-10-CM | POA: Diagnosis not present

## 2017-07-16 DIAGNOSIS — N641 Fat necrosis of breast: Secondary | ICD-10-CM | POA: Insufficient documentation

## 2017-07-16 DIAGNOSIS — Z9012 Acquired absence of left breast and nipple: Secondary | ICD-10-CM | POA: Diagnosis not present

## 2017-07-16 DIAGNOSIS — T8541XA Breakdown (mechanical) of breast prosthesis and implant, initial encounter: Secondary | ICD-10-CM | POA: Diagnosis not present

## 2017-07-16 HISTORY — PX: BREAST IMPLANT REMOVAL: SHX5361

## 2017-07-16 LAB — BASIC METABOLIC PANEL
ANION GAP: 11 (ref 5–15)
BUN: 12 mg/dL (ref 6–20)
CHLORIDE: 102 mmol/L (ref 101–111)
CO2: 25 mmol/L (ref 22–32)
Calcium: 9.1 mg/dL (ref 8.9–10.3)
Creatinine, Ser: 0.93 mg/dL (ref 0.44–1.00)
GFR calc Af Amer: >60 mL/min (ref >60)
GFR calc non Af Amer: >60 mL/min (ref >60)
Glucose, Bld: 93 mg/dL (ref 65–99)
Potassium: 4.1 mmol/L (ref 3.5–5.1)
Sodium: 138 mmol/L (ref 135–145)

## 2017-07-16 LAB — CBC
HEMATOCRIT: 37.1 % (ref 36.0–46.0)
HEMOGLOBIN: 12.3 g/dL (ref 12.0–15.0)
MCH: 28.8 pg (ref 26.0–34.0)
MCHC: 33.2 g/dL (ref 30.0–36.0)
MCV: 86.9 fL (ref 78.0–100.0)
PLATELETS: 330 10*3/uL (ref 150–400)
RBC: 4.27 MIL/uL (ref 3.87–5.11)
RDW: 14.1 % (ref 11.5–15.5)
WBC: 5.3 10*3/uL (ref 4.0–10.5)

## 2017-07-16 LAB — SURGICAL PCR SCREEN
MRSA, PCR: NEGATIVE
STAPHYLOCOCCUS AUREUS: NEGATIVE

## 2017-07-16 SURGERY — REMOVAL, IMPLANT, BREAST
Anesthesia: General | Site: Breast | Laterality: Left

## 2017-07-16 SURGERY — REMOVAL, IMPLANT, BREAST
Anesthesia: General | Laterality: Left

## 2017-07-16 MED ORDER — OXYCODONE-ACETAMINOPHEN 5-325 MG PO TABS: 1.0000 | ORAL_TABLET | ORAL | Status: DC | PRN

## 2017-07-16 MED ORDER — ROCURONIUM BROMIDE 100 MG/10ML IV SOLN
INTRAVENOUS | Status: DC | PRN
  Administered 2017-07-16: 50 mg via INTRAVENOUS

## 2017-07-16 MED ORDER — MIDAZOLAM HCL 2 MG/2ML IJ SOLN
INTRAMUSCULAR | Status: AC
  Filled 2017-07-16: qty 2

## 2017-07-16 MED ORDER — SCOPOLAMINE 1 MG/3DAYS TD PT72
MEDICATED_PATCH | TRANSDERMAL | Status: DC | PRN
  Administered 2017-07-16: 1 via TRANSDERMAL

## 2017-07-16 MED ORDER — LISDEXAMFETAMINE DIMESYLATE 20 MG PO CAPS
40.0000 mg | ORAL_CAPSULE | Freq: Every day | ORAL | Status: DC
  Administered 2017-07-17: 40 mg via ORAL
  Filled 2017-07-16: qty 2

## 2017-07-16 MED ORDER — SODIUM CHLORIDE 0.9 % IV SOLN
INTRAVENOUS | Status: DC | PRN
  Administered 2017-07-16: 500 mL

## 2017-07-16 MED ORDER — DEXTROSE 5 % IV SOLN
INTRAVENOUS | Status: DC | PRN
  Administered 2017-07-16: 50 ug/min via INTRAVENOUS

## 2017-07-16 MED ORDER — TRAZODONE HCL 100 MG PO TABS: 100.0000 mg | ORAL_TABLET | Freq: Every day | ORAL | Status: DC

## 2017-07-16 MED ORDER — SUGAMMADEX SODIUM 200 MG/2ML IV SOLN
INTRAVENOUS | Status: DC | PRN
  Administered 2017-07-16: 300 mg via INTRAVENOUS

## 2017-07-16 MED ORDER — ONDANSETRON HCL 4 MG/2ML IJ SOLN
INTRAMUSCULAR | Status: AC
  Filled 2017-07-16: qty 2

## 2017-07-16 MED ORDER — PHENYLEPHRINE 40 MCG/ML (10ML) SYRINGE FOR IV PUSH (FOR BLOOD PRESSURE SUPPORT)
PREFILLED_SYRINGE | INTRAVENOUS | Status: AC
  Filled 2017-07-16: qty 10

## 2017-07-16 MED ORDER — FENTANYL CITRATE (PF) 100 MCG/2ML IJ SOLN
INTRAMUSCULAR | Status: DC | PRN
  Administered 2017-07-16 (×3): 50 ug via INTRAVENOUS
  Administered 2017-07-16: 100 ug via INTRAVENOUS

## 2017-07-16 MED ORDER — SODIUM CHLORIDE 0.9 % IV SOLN
INTRAVENOUS | Status: DC
  Administered 2017-07-16: 15:00:00 via INTRAVENOUS

## 2017-07-16 MED ORDER — QUETIAPINE FUMARATE 50 MG PO TABS
75.0000 mg | ORAL_TABLET | Freq: Every day | ORAL | Status: DC
  Administered 2017-07-17: 75 mg via ORAL
  Filled 2017-07-16: qty 2

## 2017-07-16 MED ORDER — 0.9 % SODIUM CHLORIDE (POUR BTL) OPTIME
TOPICAL | Status: DC | PRN
  Administered 2017-07-16: 1000 mL

## 2017-07-16 MED ORDER — FENTANYL CITRATE (PF) 250 MCG/5ML IJ SOLN
INTRAMUSCULAR | Status: AC
  Filled 2017-07-16: qty 5

## 2017-07-16 MED ORDER — MIDAZOLAM HCL 5 MG/5ML IJ SOLN
INTRAMUSCULAR | Status: DC | PRN
  Administered 2017-07-16: 2 mg via INTRAVENOUS

## 2017-07-16 MED ORDER — ONDANSETRON HCL 4 MG/2ML IJ SOLN: 4.0000 mg | Freq: Four times a day (QID) | INTRAMUSCULAR | Status: DC | PRN

## 2017-07-16 MED ORDER — LAMOTRIGINE 100 MG PO TABS
100.0000 mg | ORAL_TABLET | Freq: Every day | ORAL | Status: DC
  Administered 2017-07-16 – 2017-07-17 (×2): 100 mg via ORAL
  Filled 2017-07-16 (×2): qty 1

## 2017-07-16 MED ORDER — LIDOCAINE 2% (20 MG/ML) 5 ML SYRINGE
INTRAMUSCULAR | Status: AC
  Filled 2017-07-16: qty 5

## 2017-07-16 MED ORDER — ONDANSETRON 4 MG PO TBDP: 4.0000 mg | ORAL_TABLET | Freq: Four times a day (QID) | ORAL | Status: DC | PRN

## 2017-07-16 MED ORDER — PROPOFOL 10 MG/ML IV BOLUS
INTRAVENOUS | Status: DC | PRN
  Administered 2017-07-16: 110 mg via INTRAVENOUS

## 2017-07-16 MED ORDER — HYDROMORPHONE HCL 1 MG/ML IJ SOLN
0.5000 mg | INTRAMUSCULAR | Status: DC | PRN
  Administered 2017-07-16 (×2): 0.5 mg via INTRAVENOUS
  Filled 2017-07-16 (×2): qty 1

## 2017-07-16 MED ORDER — ONDANSETRON HCL 4 MG/2ML IJ SOLN
INTRAMUSCULAR | Status: DC | PRN
  Administered 2017-07-16: 4 mg via INTRAVENOUS

## 2017-07-16 MED ORDER — ENOXAPARIN SODIUM 40 MG/0.4ML ~~LOC~~ SOLN: 40.0000 mg | SUBCUTANEOUS | Status: DC

## 2017-07-16 MED ORDER — PHENYLEPHRINE HCL 10 MG/ML IJ SOLN
INTRAMUSCULAR | Status: DC | PRN
  Administered 2017-07-16 (×4): 80 ug via INTRAVENOUS

## 2017-07-16 MED ORDER — PROPOFOL 10 MG/ML IV BOLUS
INTRAVENOUS | Status: AC
  Filled 2017-07-16: qty 20

## 2017-07-16 MED ORDER — CIPROFLOXACIN IN D5W 400 MG/200ML IV SOLN
400.0000 mg | Freq: Once | INTRAVENOUS | Status: AC
  Administered 2017-07-16: 400 mg via INTRAVENOUS
  Filled 2017-07-16: qty 200

## 2017-07-16 MED ORDER — LIDOCAINE HCL (CARDIAC) 20 MG/ML IV SOLN
INTRAVENOUS | Status: DC | PRN
  Administered 2017-07-16: 60 mg via INTRAVENOUS

## 2017-07-16 MED ORDER — LACTATED RINGERS IV SOLN
INTRAVENOUS | Status: DC | PRN
  Administered 2017-07-16: 23:00:00 via INTRAVENOUS

## 2017-07-16 SURGICAL SUPPLY — 67 items
ADH SKN CLS APL DERMABOND .7 (GAUZE/BANDAGES/DRESSINGS)
BAG DECANTER FOR FLEXI CONT (MISCELLANEOUS) ×3 IMPLANT
BINDER BREAST LRG (GAUZE/BANDAGES/DRESSINGS) ×2 IMPLANT
BINDER BREAST XLRG (GAUZE/BANDAGES/DRESSINGS) IMPLANT
CANISTER SUCT 3000ML PPV (MISCELLANEOUS) ×3 IMPLANT
CHLORAPREP W/TINT 26ML (MISCELLANEOUS) ×1 IMPLANT
CLOSURE STERI-STRIP 1/2X4 (GAUZE/BANDAGES/DRESSINGS)
CLSR STERI-STRIP ANTIMIC 1/2X4 (GAUZE/BANDAGES/DRESSINGS) ×1 IMPLANT
CONT SPEC 4OZ CLIKSEAL STRL BL (MISCELLANEOUS) ×2 IMPLANT
COVER SURGICAL LIGHT HANDLE (MISCELLANEOUS) ×3 IMPLANT
DERMABOND ADVANCED (GAUZE/BANDAGES/DRESSINGS)
DERMABOND ADVANCED .7 DNX12 (GAUZE/BANDAGES/DRESSINGS) ×2 IMPLANT
DRAIN CHANNEL 15F RND FF W/TCR (WOUND CARE) ×2 IMPLANT
DRAPE HALF SHEET 40X57 (DRAPES) ×2 IMPLANT
DRAPE INCISE IOBAN 66X45 STRL (DRAPES) ×2 IMPLANT
DRAPE ORTHO SPLIT 77X108 STRL (DRAPES) ×6
DRAPE SURG ORHT 6 SPLT 77X108 (DRAPES) ×2 IMPLANT
DRAPE WARM FLUID 44X44 (DRAPE) ×1 IMPLANT
DRSG ADAPTIC 3X8 NADH LF (GAUZE/BANDAGES/DRESSINGS) ×2 IMPLANT
DRSG PAD ABDOMINAL 8X10 ST (GAUZE/BANDAGES/DRESSINGS) ×6 IMPLANT
DRSG TEGADERM 2-3/8X2-3/4 SM (GAUZE/BANDAGES/DRESSINGS) ×3 IMPLANT
DRSG TEGADERM 4X4.75 (GAUZE/BANDAGES/DRESSINGS) ×4 IMPLANT
ELECT BLADE 4.0 EZ CLEAN MEGAD (MISCELLANEOUS) ×3
ELECT CAUTERY BLADE 6.4 (BLADE) ×3 IMPLANT
ELECT COATED BLADE 2.86 ST (ELECTRODE) ×3 IMPLANT
ELECT REM PT RETURN 9FT ADLT (ELECTROSURGICAL) ×3
ELECTRODE BLDE 4.0 EZ CLN MEGD (MISCELLANEOUS) ×1 IMPLANT
ELECTRODE REM PT RTRN 9FT ADLT (ELECTROSURGICAL) ×1 IMPLANT
EVACUATOR SILICONE 100CC (DRAIN) ×2 IMPLANT
GAUZE SPONGE 4X4 12PLY STRL (GAUZE/BANDAGES/DRESSINGS) ×1 IMPLANT
GLOVE BIO SURGEON STRL SZ 6 (GLOVE) ×9 IMPLANT
GOWN STRL REUS W/ TWL LRG LVL3 (GOWN DISPOSABLE) ×2 IMPLANT
GOWN STRL REUS W/TWL LRG LVL3 (GOWN DISPOSABLE) ×6
KIT BASIN OR (CUSTOM PROCEDURE TRAY) ×3 IMPLANT
KIT ROOM TURNOVER OR (KITS) ×3 IMPLANT
MARKER SKIN DUAL TIP RULER LAB (MISCELLANEOUS) ×3 IMPLANT
NS IRRIG 1000ML POUR BTL (IV SOLUTION) ×6 IMPLANT
PACK GENERAL/GYN (CUSTOM PROCEDURE TRAY) ×3 IMPLANT
PAD ABD 8X10 STRL (GAUZE/BANDAGES/DRESSINGS) ×2 IMPLANT
PAD ARMBOARD 7.5X6 YLW CONV (MISCELLANEOUS) ×3 IMPLANT
PIN SAFETY STERILE (MISCELLANEOUS) ×3 IMPLANT
SET ASEPTIC TRANSFER (MISCELLANEOUS) ×3 IMPLANT
SOL PREP POV-IOD 4OZ 10% (MISCELLANEOUS) ×3 IMPLANT
STAPLER VISISTAT 35W (STAPLE) ×3 IMPLANT
SUT CHROMIC 4 0 PS 2 18 (SUTURE) IMPLANT
SUT ETHILON 2 0 FS 18 (SUTURE) ×8 IMPLANT
SUT ETHILON 4 0 PS 2 18 (SUTURE) ×4 IMPLANT
SUT MNCRL 3 0 RB1 (SUTURE) IMPLANT
SUT MNCRL AB 3-0 PS2 18 (SUTURE) ×2 IMPLANT
SUT MNCRL AB 4-0 PS2 18 (SUTURE) IMPLANT
SUT MON AB 3-0 SH 27 (SUTURE) ×3
SUT MON AB 3-0 SH27 (SUTURE) IMPLANT
SUT MONOCRYL 3 0 RB1 (SUTURE)
SUT VIC AB 0 CT1 27 (SUTURE)
SUT VIC AB 0 CT1 27XBRD ANBCTR (SUTURE) IMPLANT
SUT VIC AB 3-0 SH 27 (SUTURE)
SUT VIC AB 3-0 SH 27X BRD (SUTURE) IMPLANT
SUT VICRYL 3 0 (SUTURE) IMPLANT
SUT VICRYL 4-0 PS2 18IN ABS (SUTURE) IMPLANT
SUT VLOC 180 0 24IN GS25 (SUTURE) IMPLANT
SYR BULB IRRIGATION 50ML (SYRINGE) ×3 IMPLANT
TAPE STRIPS DRAPE STRL (GAUZE/BANDAGES/DRESSINGS) ×3 IMPLANT
TOWEL OR 17X24 6PK STRL BLUE (TOWEL DISPOSABLE) ×3 IMPLANT
TOWEL OR 17X26 10 PK STRL BLUE (TOWEL DISPOSABLE) ×3 IMPLANT
TRAY FOLEY W/METER SILVER 16FR (SET/KITS/TRAYS/PACK) IMPLANT
TUBE CONNECTING 12'X1/4 (SUCTIONS) ×1
TUBE CONNECTING 12X1/4 (SUCTIONS) ×2 IMPLANT

## 2017-07-16 NOTE — Anesthesia Preprocedure Evaluation (Signed)
Anesthesia Evaluation  Patient identified by MRN, date of birth, ID band Patient awake    Reviewed: Allergy & Precautions, H&P , NPO status , Patient's Chart, lab work & pertinent test results  History of Anesthesia Complications (+) PONV and history of anesthetic complications  Airway Mallampati: II   Neck ROM: full    Dental   Pulmonary shortness of breath, asthma , sleep apnea , Current Smoker,    breath sounds clear to auscultation       Cardiovascular negative cardio ROS   Rhythm:regular Rate:Normal     Neuro/Psych  Headaches, PSYCHIATRIC DISORDERS Anxiety Depression    GI/Hepatic   Endo/Other    Renal/GU      Musculoskeletal  (+) Arthritis ,   Abdominal   Peds  Hematology   Anesthesia Other Findings   Reproductive/Obstetrics                             Anesthesia Physical Anesthesia Plan  ASA: II  Anesthesia Plan: General   Post-op Pain Management:    Induction: Intravenous  PONV Risk Score and Plan: 3 and Ondansetron, Dexamethasone and Midazolam  Airway Management Planned: Oral ETT  Additional Equipment:   Intra-op Plan:   Post-operative Plan: Extubation in OR  Informed Consent: I have reviewed the patients History and Physical, chart, labs and discussed the procedure including the risks, benefits and alternatives for the proposed anesthesia with the patient or authorized representative who has indicated his/her understanding and acceptance.     Plan Discussed with: CRNA, Anesthesiologist and Surgeon  Anesthesia Plan Comments:         Anesthesia Quick Evaluation

## 2017-07-16 NOTE — H&P (Signed)
  Subjective:     Patient ID: [name] is a 41 y.o. female.  HPI   2.5 months post op revision for severe rippling and inferior displacement. Fever noted POD#7, seroma fluid aspirated anterior to left implant and culture of this Serratia, sensitive to Bactrim. Completed Bactrim course.At last visit noted to gave AP flipping implant. Since last visit has had surgery with Orthopaedics for left hindfoot fusion. She remains NWB on this leg and has f/u 2 weeks with her surgeon.  Presents today stating implant exposed after shower. States last week had   Presented to [facility] ED 03/2016 with c/o breast mass, reported she could not get MMG as she had no PCP. Subsequest establishment to GYN and referral for MMG/US. MMG and [place] benign, palpable mass cyst, breast density D. She has had history fibrocystic disease and prior aspirations/resections. 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 Lives with adult daughter. She quit smoking 06/2016  Prior 36 B, desires more lifted, "C" cup. Right mastectomy 453 g Left 346 g.   Review of Systems    Objective:   Physical Exam  Cardiovascular: Normal rate, regular rhythm and normal heart sounds.   Pulmonary/Chest: Effort normal and breath sounds normal.  Neurological: She is alert.   Chest: right benign Left breast with multiple holes large in mastectomy flap with gross exposure implant no drainage  Assessment:     Dense breasts, Family history breast ca S/p bilateral NSM, TE/ADM (Cortiva) reconstruction S/p silicone implant exchange S/p revision with HCG implant exchange, ADM (Alloderm) bilateral Left breast implant extrusion/erosion with gross exposure.    Plan:      Pictures today.    Patient is poor historian. Since last exam has developed full thickness erosion of implant through mastectomy flap, unclear when this  started. She states she called last week and stated her implant was flipped and this was first appointment. I suspect infection that led to this. Plan debridement and removal implant, drain placement today. Admit to [facility].   Natrelle Inspira Smooth Round Cohesive gel implants 400 ml placed bilateral. REF SCX-400   Irene Limbo, MD [facility] Plastic & Reconstructive Surgery [phone], pin [phone]

## 2017-07-16 NOTE — Anesthesia Procedure Notes (Signed)
Procedure Name: Intubation Date/Time: 07/16/2017 11:29 PM Performed by: Greggory Stallion, Cheryle Dark L Pre-anesthesia Checklist: Patient identified, Emergency Drugs available, Suction available and Patient being monitored Patient Re-evaluated:Patient Re-evaluated prior to induction Oxygen Delivery Method: Circle System Utilized Preoxygenation: Pre-oxygenation with 100% oxygen Induction Type: IV induction Ventilation: Mask ventilation without difficulty Laryngoscope Size: Mac and 3 Grade View: Grade III Tube type: Oral Tube size: 7.0 mm Number of attempts: 1 Airway Equipment and Method: Stylet and Oral airway Placement Confirmation: ETT inserted through vocal cords under direct vision,  positive ETCO2 and breath sounds checked- equal and bilateral Secured at: 20 cm Tube secured with: Tape Dental Injury: Teeth and Oropharynx as per pre-operative assessment

## 2017-07-16 NOTE — Progress Notes (Signed)
Pt to OR.

## 2017-07-17 ENCOUNTER — Encounter (HOSPITAL_COMMUNITY): Payer: Self-pay | Admitting: Plastic Surgery

## 2017-07-17 DIAGNOSIS — T8542XA Displacement of breast prosthesis and implant, initial encounter: Secondary | ICD-10-CM | POA: Diagnosis not present

## 2017-07-17 DIAGNOSIS — N641 Fat necrosis of breast: Secondary | ICD-10-CM | POA: Diagnosis not present

## 2017-07-17 DIAGNOSIS — N611 Abscess of the breast and nipple: Secondary | ICD-10-CM | POA: Diagnosis not present

## 2017-07-17 LAB — HIV ANTIBODY (ROUTINE TESTING W REFLEX): HIV SCREEN 4TH GENERATION: NONREACTIVE

## 2017-07-17 MED ORDER — PROMETHAZINE HCL 12.5 MG PO TABS: 12.5000 mg | ORAL_TABLET | Freq: Four times a day (QID) | ORAL | 0 refills | Status: DC | PRN

## 2017-07-17 MED ORDER — PROCHLORPERAZINE MALEATE 10 MG PO TABS
10.0000 mg | ORAL_TABLET | Freq: Four times a day (QID) | ORAL | Status: DC | PRN
  Filled 2017-07-17: qty 1

## 2017-07-17 MED ORDER — MENTHOL 3 MG MT LOZG
1.0000 | LOZENGE | OROMUCOSAL | Status: DC | PRN
  Administered 2017-07-17: 3 mg via ORAL
  Filled 2017-07-17: qty 9

## 2017-07-17 MED ORDER — OXYCODONE HCL 5 MG/5ML PO SOLN: 5.0000 mg | Freq: Once | ORAL | Status: DC | PRN

## 2017-07-17 MED ORDER — HYDROMORPHONE HCL 1 MG/ML IJ SOLN
INTRAMUSCULAR | Status: AC
  Filled 2017-07-17: qty 1

## 2017-07-17 MED ORDER — HYDROMORPHONE HCL 1 MG/ML IJ SOLN
0.2500 mg | INTRAMUSCULAR | Status: DC | PRN
  Administered 2017-07-17 (×3): 0.5 mg via INTRAVENOUS

## 2017-07-17 MED ORDER — ONDANSETRON HCL 4 MG/2ML IJ SOLN
4.0000 mg | Freq: Four times a day (QID) | INTRAMUSCULAR | Status: DC | PRN
  Administered 2017-07-17: 4 mg via INTRAVENOUS
  Filled 2017-07-17: qty 2

## 2017-07-17 MED ORDER — ENOXAPARIN SODIUM 40 MG/0.4ML ~~LOC~~ SOLN
40.0000 mg | SUBCUTANEOUS | Status: DC
  Administered 2017-07-17: 40 mg via SUBCUTANEOUS
  Filled 2017-07-17: qty 0.4

## 2017-07-17 MED ORDER — OXYCODONE HCL 5 MG PO TABS: 5.0000 mg | ORAL_TABLET | Freq: Once | ORAL | Status: DC | PRN

## 2017-07-17 MED ORDER — HYDROMORPHONE HCL 1 MG/ML IJ SOLN: 0.5000 mg | INTRAMUSCULAR | Status: DC | PRN

## 2017-07-17 MED ORDER — DOCUSATE SODIUM 100 MG PO CAPS
100.0000 mg | ORAL_CAPSULE | Freq: Every day | ORAL | Status: DC
  Administered 2017-07-17: 100 mg via ORAL
  Filled 2017-07-17: qty 1

## 2017-07-17 MED ORDER — ENOXAPARIN SODIUM 40 MG/0.4ML ~~LOC~~ SOLN: 40.0000 mg | Freq: Every day | SUBCUTANEOUS | Status: DC

## 2017-07-17 MED ORDER — ONDANSETRON 4 MG PO TBDP: 4.0000 mg | ORAL_TABLET | Freq: Four times a day (QID) | ORAL | Status: DC | PRN

## 2017-07-17 MED ORDER — KCL IN DEXTROSE-NACL 20-5-0.45 MEQ/L-%-% IV SOLN
INTRAVENOUS | Status: DC
  Administered 2017-07-17: 05:00:00 via INTRAVENOUS
  Filled 2017-07-17: qty 1000

## 2017-07-17 MED ORDER — CIPROFLOXACIN IN D5W 400 MG/200ML IV SOLN
400.0000 mg | Freq: Two times a day (BID) | INTRAVENOUS | Status: DC
  Administered 2017-07-17: 400 mg via INTRAVENOUS
  Filled 2017-07-17 (×2): qty 200

## 2017-07-17 MED ORDER — ONDANSETRON HCL 4 MG/2ML IJ SOLN: 4.0000 mg | Freq: Four times a day (QID) | INTRAMUSCULAR | Status: DC | PRN

## 2017-07-17 MED ORDER — VILAZODONE HCL 40 MG PO TABS
40.0000 mg | ORAL_TABLET | Freq: Every day | ORAL | Status: DC
  Administered 2017-07-17: 40 mg via ORAL
  Filled 2017-07-17: qty 1

## 2017-07-17 MED ORDER — PROCHLORPERAZINE EDISYLATE 5 MG/ML IJ SOLN: 5.0000 mg | Freq: Four times a day (QID) | INTRAMUSCULAR | Status: DC | PRN

## 2017-07-17 MED ORDER — OXYCODONE-ACETAMINOPHEN 5-325 MG PO TABS
1.0000 | ORAL_TABLET | ORAL | Status: DC | PRN
  Administered 2017-07-17: 2 via ORAL
  Filled 2017-07-17: qty 2

## 2017-07-17 NOTE — Transfer of Care (Signed)
Immediate Anesthesia Transfer of Care Note  Patient: [name]  Procedure(s) Performed: Procedure(s): REMOVAL BREAST IMPLANTS WITH DEBRIDEMENT OF BREAST FLAP (Left)  Patient Location: PACU  Anesthesia Type:General  Level of Consciousness: awake, alert , oriented and patient cooperative  Airway & Oxygen Therapy: Patient Spontanous Breathing and Patient connected to nasal cannula oxygen  Post-op Assessment: Report given to RN and Post -op Vital signs reviewed and stable  Post vital signs: Reviewed and stable  Last Vitals:  Vitals:   07/16/17 2101 07/17/17 0042  BP: 117/78 132/85  Pulse: 95 97  Resp: 18 14  Temp: 36.8 C 36.6 C  SpO2: 100% 100%    Last Pain:  Vitals:   07/16/17 2101  TempSrc: Oral  PainSc:          Complications: No apparent anesthesia complications

## 2017-07-17 NOTE — Op Note (Signed)
Operative Note   DATE OF OPERATION: 9.5.18  LOCATION: [facility]-observation  SURGICAL DIVISION: Plastic Surgery  PREOPERATIVE DIAGNOSES:  1. Acquired absence breasts 2. Extrusion left breast implant  POSTOPERATIVE DIAGNOSES:  same  PROCEDURE:  1. Removal left breast implant with debridement left mastectomy flap  SURGEON: Irene Limbo MD MBA  ASSISTANT: none  ANESTHESIA:  General.   EBL: 25 ml  COMPLICATIONS: None immediate.   INDICATIONS FOR PROCEDURE:  The patient, [name], is a 41 y.o. female born on 11-04-1976, is here for debridement left mastectomy flap and implant removal. She has undergone bilateral nipple sparing mastectomies with implant based reconstruction for dense breasts. She is nearly three months post operative from revision surgery with implant replacement and acellular dermis reinforcement. The left breast had prior seroma aspirated with culture Serratia. She presents today with grossly exposed implant and ulceration mastectomy flap.    FINDINGS: Removed intact breast implant that was positioned in correct anterior-posterior position. No cellulitis of remaining mastectomy flap. Two large holes/ulcerations noted not involving NAC.   DESCRIPTION OF PROCEDURE:  The patient's operative site was marked with the patient in the preoperative area. The patient was taken to the operating room. SCDs were placed and IV antibiotics were given. The patient's operative site was prepped and draped in a sterile fashion. A time out was performed and all information was confirmed to be correct. Sharp debridement of non viable flap completed. The remaining acellular dermis present over lower pole and chest wall was adherent and left in place. The capsule and cavity treated with curettage followed by irrigation with polymyxin-bacitracin solution. 15 Fr JP placed in cavity and secured with 2-0 nylon. Closure completed with interrupted 3-0 monocryl in dermis and short running 4-0  nylon for skin closure, length closure 13 cm. Adaptic applied followed by dry dressing and breast binder.  The patient was allowed to wake from anesthesia, extubated and taken to the recovery room in satisfactory condition.   SPECIMENS: left mastectomy flap  DRAINS: 40 Fr JP in left chest  Irene Limbo, MD [facility] Plastic & Reconstructive Surgery

## 2017-07-17 NOTE — Progress Notes (Signed)
Patient discharged to home with instructions and prescription. 

## 2017-07-17 NOTE — Discharge Summary (Signed)
Physician Discharge Summary  Patient ID: [name] MRN: [id number] DOB/AGE: Feb 26, 1976 41 y.o.  Admit date: 07/16/2017 Discharge date: 07/17/2017  Admission Diagnoses: Acquired absence breast, exposure left breast implant  Discharge Diagnoses:  same  Discharged Condition: stable  Hospital Course: Patient presented to clinic with unknown duration gross exposure left breast implant and partial necrosis mastectomy flap nearly 3 months post revision breast reconstruction. She underwent debridement and removal implant and closure. There was no fluid or cellulitis. Covered with perioperative Cipro as she has prior culture Serratia from seroma from this chest.  Treatments: surgery: debridement left mastectomy flap and removal breast implant 9.5.18  Discharge Exam: Blood pressure 119/68, pulse (!) 106, temperature 98.2 F (36.8 C), temperature source Oral, resp. rate 15, last menstrual period 01/07/2013, SpO2 100 %. Incision/Wound: chest left flat without significant drainage, drain serosanguinous, incisions intact  Disposition: 01-Home or Self Care  Discharge Instructions    Call MD for:  redness, tenderness, or signs of infection (pain, swelling, bleeding, redness, odor or green/yellow discharge around incision site)    Complete by:  As directed    Call MD for:  temperature >100.5    Complete by:  As directed    Discharge instructions    Complete by:  As directed    Ok to remove dressings and shower 9.7.18. Soap and water ok, pat incisions dry. No creams or ointments over incisions. Do not let drain dangle in shower, attach to lanyard or similar.Strip and record drain twice daily and bring log to clinic visit.  Breast binder as desired. Ok to leave open to air.  Ok to raise arms above shoulders for bathing and dressing.  No house yard work or exercise until cleared by MD.   Patient has Percocet from orthopaedic surgery at home- no additional pain Rx given.   Driving Restrictions     Complete by:  As directed    No driving if taking narcotics   Lifting restrictions    Complete by:  As directed    No lifting > 5 lbs with left arm through follow up visit   Resume previous diet    Complete by:  As directed      Allergies as of 07/17/2017      Reactions   Shrimp [shellfish Allergy] Shortness Of Breath, Swelling   Kiwi Extract Hives      Medication List    STOP taking these medications   sulfamethoxazole-trimethoprim 800-160 MG tablet Commonly known as:  BACTRIM DS,SEPTRA DS     TAKE these medications   aspirin EC 81 MG tablet Take 81 mg by mouth daily.   desvenlafaxine 50 MG 24 hr tablet Commonly known as:  PRISTIQ Take 50 mg by mouth daily.   docusate sodium 100 MG capsule Commonly known as:  COLACE Take 100 mg by mouth daily.   ferrous sulfate 325 (65 FE) MG EC tablet Take 325 mg by mouth 3 (three) times daily with meals.   ibuprofen 600 MG tablet Commonly known as:  ADVIL,MOTRIN Take 1 tablet (600 mg total) by mouth every 6 (six) hours as needed.   lamoTRIgine 100 MG tablet Commonly known as:  LAMICTAL Take 100 mg by mouth daily.   oxyCODONE-acetaminophen 10-325 MG tablet Commonly known as:  PERCOCET Take 1 tablet by mouth every 4 (four) hours as needed for pain.   promethazine 12.5 MG tablet Commonly known as:  PHENERGAN Take 1 tablet (12.5 mg total) by mouth every 6 (six) hours as needed for nausea  or vomiting.   QUEtiapine 25 MG tablet Commonly known as:  SEROQUEL Take 75 mg by mouth at bedtime.   traZODone 100 MG tablet Commonly known as:  DESYREL Take 100-200 mg by mouth at bedtime.   VIIBRYD 40 MG Tabs Generic drug:  Vilazodone HCl Take 40 mg by mouth daily.   VITAMIN C PO Take 1 tablet by mouth 3 (three) times daily.            Discharge Care Instructions        Start     Ordered   07/17/17 0000  promethazine (PHENERGAN) 12.5 MG tablet  Every 6 hours PRN     07/17/17 0753   07/17/17 0000  Resume previous diet      07/17/17 0753   07/17/17 0000  Driving Restrictions    Comments:  No driving if taking narcotics   07/17/17 0753   07/17/17 0000  Lifting restrictions    Comments:  No lifting > 5 lbs with left arm through follow up visit   07/17/17 0753   07/17/17 0000  Call MD for:  temperature >100.5     07/17/17 0753   07/17/17 0000  Call MD for:  redness, tenderness, or signs of infection (pain, swelling, bleeding, redness, odor or green/yellow discharge around incision site)     07/17/17 0753   07/17/17 0000  Discharge instructions    Comments:  Ok to remove dressings and shower 9.7.18. Soap and water ok, pat incisions dry. No creams or ointments over incisions. Do not let drain dangle in shower, attach to lanyard or similar.Strip and record drain twice daily and bring log to clinic visit.  Breast binder as desired. Ok to leave open to air.  Ok to raise arms above shoulders for bathing and dressing.  No house yard work or exercise until cleared by MD.   Patient has Percocet from orthopaedic surgery at home- no additional pain Rx given.   07/17/17 0753     Follow-up Information    Rayburn, Neta Mends, PA-C Follow up on 07/23/2017.   Specialty:  General Surgery Why:  1:40 pm Contact information: [address] [place] [address] [phone]           Signed: Irene Limbo 07/17/2017, 7:54 AM

## 2017-07-21 NOTE — Anesthesia Postprocedure Evaluation (Signed)
Anesthesia Post Note  Patient: [name]  Procedure(s) Performed: Procedure(s) (LRB): REMOVAL BREAST IMPLANTS WITH DEBRIDEMENT OF BREAST FLAP (Left)     Patient location during evaluation: PACU Anesthesia Type: General Level of consciousness: awake and alert Pain management: pain level controlled Vital Signs Assessment: post-procedure vital signs reviewed and stable Respiratory status: spontaneous breathing, nonlabored ventilation, respiratory function stable and patient connected to nasal cannula oxygen Cardiovascular status: blood pressure returned to baseline and stable Postop Assessment: no signs of nausea or vomiting Anesthetic complications: no    Last Vitals:  Vitals:   07/17/17 0134 07/17/17 0450  BP: 119/78 119/68  Pulse: 88 (!) 106  Resp: 12 15  Temp: 36.9 C 36.8 C  SpO2: 100% 100%    Last Pain:  Vitals:   07/17/17 1211  TempSrc:   PainSc: [place]

## 2017-07-29 DIAGNOSIS — E785 Hyperlipidemia, unspecified: Secondary | ICD-10-CM | POA: Diagnosis not present

## 2017-07-29 DIAGNOSIS — Z87891 Personal history of nicotine dependence: Secondary | ICD-10-CM | POA: Diagnosis not present

## 2017-07-29 DIAGNOSIS — Z7982 Long term (current) use of aspirin: Secondary | ICD-10-CM | POA: Diagnosis not present

## 2017-07-29 DIAGNOSIS — Z9889 Other specified postprocedural states: Secondary | ICD-10-CM | POA: Diagnosis not present

## 2017-07-29 DIAGNOSIS — Z981 Arthrodesis status: Secondary | ICD-10-CM | POA: Diagnosis not present

## 2017-07-29 DIAGNOSIS — Z79899 Other long term (current) drug therapy: Secondary | ICD-10-CM | POA: Diagnosis not present

## 2017-07-29 DIAGNOSIS — M19072 Primary osteoarthritis, left ankle and foot: Secondary | ICD-10-CM | POA: Diagnosis not present

## 2017-07-29 DIAGNOSIS — Z4889 Encounter for other specified surgical aftercare: Secondary | ICD-10-CM | POA: Diagnosis not present

## 2017-07-29 DIAGNOSIS — Z7289 Other problems related to lifestyle: Secondary | ICD-10-CM | POA: Diagnosis not present

## 2017-07-29 DIAGNOSIS — M19172 Post-traumatic osteoarthritis, left ankle and foot: Secondary | ICD-10-CM | POA: Diagnosis not present

## 2017-07-29 DIAGNOSIS — Z7689 Persons encountering health services in other specified circumstances: Secondary | ICD-10-CM | POA: Diagnosis not present

## 2017-07-29 DIAGNOSIS — J45909 Unspecified asthma, uncomplicated: Secondary | ICD-10-CM | POA: Diagnosis not present

## 2017-08-12 DIAGNOSIS — Z853 Personal history of malignant neoplasm of breast: Secondary | ICD-10-CM | POA: Diagnosis not present

## 2017-08-12 DIAGNOSIS — E785 Hyperlipidemia, unspecified: Secondary | ICD-10-CM | POA: Diagnosis not present

## 2017-08-12 DIAGNOSIS — E039 Hypothyroidism, unspecified: Secondary | ICD-10-CM | POA: Diagnosis not present

## 2017-08-13 DIAGNOSIS — Z853 Personal history of malignant neoplasm of breast: Secondary | ICD-10-CM | POA: Diagnosis not present

## 2017-08-29 DIAGNOSIS — Z0000 Encounter for general adult medical examination without abnormal findings: Secondary | ICD-10-CM | POA: Diagnosis not present

## 2017-09-09 DIAGNOSIS — J45909 Unspecified asthma, uncomplicated: Secondary | ICD-10-CM | POA: Diagnosis not present

## 2017-09-09 DIAGNOSIS — Z981 Arthrodesis status: Secondary | ICD-10-CM | POA: Diagnosis not present

## 2017-09-09 DIAGNOSIS — Z9889 Other specified postprocedural states: Secondary | ICD-10-CM | POA: Diagnosis not present

## 2017-09-09 DIAGNOSIS — E785 Hyperlipidemia, unspecified: Secondary | ICD-10-CM | POA: Diagnosis not present

## 2017-09-09 DIAGNOSIS — M19172 Post-traumatic osteoarthritis, left ankle and foot: Secondary | ICD-10-CM | POA: Diagnosis not present

## 2017-09-09 DIAGNOSIS — Z87891 Personal history of nicotine dependence: Secondary | ICD-10-CM | POA: Diagnosis not present

## 2017-09-09 DIAGNOSIS — Z7689 Persons encountering health services in other specified circumstances: Secondary | ICD-10-CM | POA: Diagnosis not present

## 2017-09-11 DIAGNOSIS — R000 Tachycardia, unspecified: Secondary | ICD-10-CM | POA: Diagnosis not present

## 2017-09-11 DIAGNOSIS — E039 Hypothyroidism, unspecified: Secondary | ICD-10-CM | POA: Diagnosis not present

## 2017-09-22 ENCOUNTER — Ambulatory Visit: Payer: Medicare Other | Admitting: Physical Therapy

## 2017-09-23 ENCOUNTER — Ambulatory Visit: Payer: Medicare Other | Attending: Family Medicine | Admitting: Physical Therapy

## 2017-09-23 DIAGNOSIS — M25672 Stiffness of left ankle, not elsewhere classified: Secondary | ICD-10-CM | POA: Diagnosis not present

## 2017-09-23 DIAGNOSIS — R262 Difficulty in walking, not elsewhere classified: Secondary | ICD-10-CM | POA: Diagnosis not present

## 2017-09-23 DIAGNOSIS — M25572 Pain in left ankle and joints of left foot: Secondary | ICD-10-CM

## 2017-09-24 NOTE — Therapy (Signed)
[place], [place], [address] Phone: [phone]   Fax:  [phone]  Physical Therapy Evaluation  Patient Details  Name: [name] MRN: [id number] Date of Birth: 1976-01-02 Referring Provider: Dr Kathi Simpers    Encounter Date: 09/23/2017  PT End of Session - 09/24/17 1435    Visit Number  1    Number of Visits  16    Date for PT Re-Evaluation  11/19/17    Authorization Type  [employer]     Activity Tolerance  Patient tolerated treatment well    Behavior During Therapy  [facility] for tasks assessed/performed       Past Medical History:  Diagnosis Date  . Anemia    no current med.  . Anxiety   . Arthritis    left ankle  . Complication of anesthesia    hx. of waking up during surgery  . Depression   . Migraines   . PONV (postoperative nausea and vomiting)   . Seasonal asthma    no current med.  . Sleep apnea    mild per pt. - no CPAP use    Past Surgical History:  Procedure Laterality Date  . ABDOMINAL HYSTERECTOMY    . NOVASURE ABLATION  03/13/2007  . TUBAL LIGATION      There were no vitals filed for this visit.   Subjective Assessment - 09/23/17 1152    Subjective  Patient had a total fusion perfromed of her left ankle. Her left ankle was partially fused in the past 2nd to a fall from a window. Her suregry was on 06/16/2017. She spent 12 weeks in a cast. she is now in a boot. she is currently non-weight bearing.     Pertinent History  anxiety, depression, arthritis, migranes     How long can you stand comfortably?  < 5 minutes     How long can you walk comfortably?  using crutches; can not weight bear     Diagnostic tests  Nothing post-op     Patient Stated Goals  To have less pain     Currently in Pain?  Yes    Pain Score  7     Pain Orientation  Left    Pain Descriptors / Indicators  Aching    Pain Type  Chronic pain    Pain Onset  More than a month ago    Pain Frequency   Constant    Aggravating Factors   Weight bearing    Pain Relieving Factors  Dont put weight on it; put her feet up     Effect of Pain on Daily Activities  Diffiulty walking          [facility] PT Assessment - 09/24/17 0001      Assessment   Medical Diagnosis  Left Ankle Fusion     Referring Provider  Dr Kathi Simpers     Onset Date/Surgical Date  06/16/17    Hand Dominance  Right    Next MD Visit  Next month     Prior Therapy  For Original fusiuon. Was in cast for 12 weeks       Precautions   Precautions  Fall    Precaution Comments  is currently non-weightbearing on crutches       Restrictions   Weight Bearing Restrictions  Yes    LLE Weight Bearing  Weight bearing as tolerated      Balance Screen   Has the patient fallen in  the past 6 months  No    Has the patient had a decrease in activity level because of a fear of falling?   No    Is the patient reluctant to leave their home because of a fear of falling?   No      Home Environment   Additional Comments  10 steps into the hosue.       Prior Function   Level of Independence  Independent    Vocation  On disability    Leisure  exercising       Cognition   Overall Cognitive Status  Within Functional Limits for tasks assessed    Attention  Focused    Focused Attention  Appears intact    Memory  Appears intact    Awareness  Appears intact    Problem Solving  Appears intact      Observation/Other Assessments   Focus on Therapeutic Outcomes (FOTO)   84% Limitation       Observation/Other Assessments-Edema    Edema  Figure 8      Figure 8 Edema   Figure 8 - Right   48.8 cm     Figure 8 - Left   52      Sensation   Light Touch  Appears Intact      Coordination   Gross Motor Movements are Fluid and Coordinated  Yes    Fine Motor Movements are Fluid and Coordinated  Yes      AROM   Overall AROM Comments  No active range of motion of the ankle       PROM   Overall PROM Comments  ankle fused at neutral        Strength   Overall Strength Comments  right LE 5/5     Left Hip Flexion  4+/5    Left Hip ABduction  4+/5    Left Knee Flexion  5/5    Left Knee Extension  4+/5      Palpation   Palpation comment  tenderness to palpation around the scar in the medial and lateral ankle       Ambulation/Gait   Gait Comments  Patient is not weight bearing at this time. she enters the clinic on crutches non weight bearing             Objective measurements completed on examination: See above findings.      [place] Adult PT Treatment/Exercise - 09/24/17 0001      Knee/Hip Exercises: Seated   Long Arc Quad Limitations  2x10 with left       Ankle Exercises: Standing   Other Standing Ankle Exercises  weight shift forward and back; side to side mod cuing for technique       Ankle Exercises: Supine   Other Supine Ankle Exercises  SLR 2x10; side lying hip abduction 2x10;               PT Short Term Goals - 09/24/17 1651      PT SHORT TERM GOAL #1   Title  Patient will begin light weight bearing activity without significant increase in pain     Time  2    Period  Weeks    Status  New      PT SHORT TERM GOAL #2   Title  Patient will ambualte with crutches with 2 point gait pattern without pain.     Baseline  met in clinic but avoids at home    Time  4    Period  Weeks    Status  New      PT SHORT TERM GOAL #3   Title  patient will demsotrate 5/5 gross left lower extremity strength     Time  4    Period  Weeks    Status  New        PT Long Term Goals - 10-10-17 1653      PT LONG TERM GOAL #1   Title  Patient will go up/down 8 steps with reciprocol gait pattern in orde rto get in and out of her house     Time  8    Period  Weeks    Status  New      PT LONG TERM GOAL #2   Title  Patient will ambualte 3000' with least restrictive AD in order to improve pain     Time  8    Period  Weeks    Status  New      PT LONG TERM GOAL #3   Title  Patient will demsotrate a 64 %  limitation on FOTO    Time  8    Period  Weeks    Status  New             Plan - Oct 10, 2017 1642    Clinical Impression Statement  Patient is a 41 year old female S/P left ankle fusion on 06/16/2017. Since that point she has been non-weight bearing. She is using crutches for mobility. She presents with expected losses in active and passive motion. Her ankle is fused in neutral. She has left lower extrmity weakness. She would benefit from skilled therapy to improve left lower extremity weakness and for gait training. She was sene for a low complexity evaluation.     History and Personal Factors relevant to plan of care:  anxiety, depression     Clinical Presentation  Evolving    Clinical Presentation due to:  Pain that is preventing her from walking and weight bearing     Clinical Decision Making  Moderate    Rehab Potential  Good    PT Frequency  2x / week    PT Duration  8 weeks    PT Treatment/Interventions  ADLs/Self Care Home Management;Cryotherapy;Electrical Stimulation;Iontophoresis 97m/ml Dexamethasone;Moist Heat;Ultrasound;Therapeutic exercise;Therapeutic activities;Neuromuscular re-education;Patient/family education;Passive range of motion;Manual techniques;Splinting;Taping    PT Next Visit Plan  continue working on gait traing; add SAQ;; review weight shifting; [place] with weight bearing. add quad set     PT Home Exercise Plan  SLR; side lying SLR; LAQ; weight shift     Consulted and Agree with Plan of Care  Patient       Patient will benefit from skilled therapeutic intervention in order to improve the following deficits and impairments:  Abnormal gait, Pain, Decreased safety awareness, Decreased activity tolerance, Decreased range of motion, Decreased strength  Visit Diagnosis: Pain in left ankle and joints of left foot  Stiffness of left ankle, not elsewhere classified  Difficulty in walking, not elsewhere classified  G-Codes - [id number]     Functional Assessment Tool Used (Outpatient Only)  FOTO, Clinical decision making     Functional Limitation  Mobility: Walking and moving around    Mobility: Walking and Moving Around Current Status ((H2094  At least 80 percent but less than 100 percent impaired, limited or restricted    Mobility: Walking and Moving Around Goal Status ((B0962  At least 60 percent  but less than 80 percent impaired, limited or restricted        Problem List Patient Active Problem List   Diagnosis Date Noted  . Acquired absence of breast and nipple 01/21/2017  . Fibrocystic breast changes, bilateral 10/29/2016  . Fibrocystic breast disease (FCBD) in female 04/17/2016  . Talus fracture 10/14/2014  . Preoperative clearance 04/12/2014  . Wound, surgical, infected 10/18/2013  . S/P laparoscopic assisted vaginal hysterectomy (LAVH) 01/21/2013  . Menorrhagia with regular cycle 01/20/2013  . S/P endometrial ablation 01/20/2013  . OBSTRUCTIVE SLEEP APNEA 07/20/2009  . ALLERGIC RHINITIS 07/20/2009  . ASTHMA 07/20/2009  . DYSPNEA 07/20/2009    Carney Living 09/24/2017, 4:58 PM  [facility] [address] [place], [place], [address] Phone: [phone]   Fax:  [phone]  Name: [name] MRN: [id number] Date of Birth: [birth year]

## 2017-10-06 ENCOUNTER — Ambulatory Visit: Payer: Medicare Other | Admitting: Physical Therapy

## 2017-10-08 ENCOUNTER — Ambulatory Visit: Payer: Medicare Other | Admitting: Physical Therapy

## 2017-10-14 ENCOUNTER — Ambulatory Visit: Payer: Medicare Other | Admitting: Physical Therapy

## 2017-10-16 ENCOUNTER — Encounter: Payer: Medicare Other | Admitting: Physical Therapy

## 2017-10-21 ENCOUNTER — Ambulatory Visit: Payer: Medicare Other | Attending: Orthopaedic Surgery | Admitting: Physical Therapy

## 2017-10-21 DIAGNOSIS — M25572 Pain in left ankle and joints of left foot: Secondary | ICD-10-CM | POA: Insufficient documentation

## 2017-10-21 DIAGNOSIS — M25672 Stiffness of left ankle, not elsewhere classified: Secondary | ICD-10-CM | POA: Insufficient documentation

## 2017-10-21 DIAGNOSIS — R262 Difficulty in walking, not elsewhere classified: Secondary | ICD-10-CM | POA: Insufficient documentation

## 2017-10-23 ENCOUNTER — Ambulatory Visit: Payer: Medicare Other | Admitting: Physical Therapy

## 2017-10-23 DIAGNOSIS — R262 Difficulty in walking, not elsewhere classified: Secondary | ICD-10-CM

## 2017-10-23 DIAGNOSIS — M25572 Pain in left ankle and joints of left foot: Secondary | ICD-10-CM

## 2017-10-23 DIAGNOSIS — M25672 Stiffness of left ankle, not elsewhere classified: Secondary | ICD-10-CM

## 2017-10-23 NOTE — Therapy (Signed)
[name], [place], [address] Phone: [phone]   Fax:  [phone]  Physical Therapy Treatment  Patient Details  Name: [name] MRN: [id number] Date of Birth: 30-Jan-1976 Referring Provider: Dr Kathi Simpers    Encounter Date: 10/23/2017  PT End of Session - 10/23/17 1330    Visit Number  2    Number of Visits  16    Date for PT Re-Evaluation  11/19/17    Authorization Type  [employer]     PT Start Time  1125 Patient 20 minutes late for appointment     PT Stop Time  1155    PT Time Calculation (min)  30 min    Activity Tolerance  Patient tolerated treatment well    Behavior During Therapy  [facility] for tasks assessed/performed       Past Medical History:  Diagnosis Date  . Anemia    no current med.  . Anxiety   . Arthritis    left ankle  . Complication of anesthesia    hx. of waking up during surgery  . Depression   . Migraines   . PONV (postoperative nausea and vomiting)   . Seasonal asthma    no current med.  . Sleep apnea    mild per pt. - no CPAP use    Past Surgical History:  Procedure Laterality Date  . ABDOMINAL HYSTERECTOMY    . ANKLE FUSION Left 10/19/2014   Procedure: Tibiocalcaneal Fusion;  Surgeon: Newt Minion, MD;  Location: [place];  Service: Orthopedics;  Laterality: Left;  . BILATERAL SALPINGECTOMY N/A 01/21/2013   Procedure: BILATERAL SALPINGECTOMY;  Surgeon: Thornell Sartorius, MD;  Location: [facility];  Service: Gynecology;  Laterality: N/A;  . BREAST IMPLANT EXCHANGE Bilateral 04/29/2017   Procedure: REVISION OF BILATERAL RECONSTRUCTION WITH SILICONE IMPLANT EXCHANGE AND ALLODERM BILATERAL BREAST;  Surgeon: Irene Limbo, MD;  Location: [place];  Service: Plastics;  Laterality: Bilateral;  . BREAST IMPLANT REMOVAL Left 07/16/2017   Procedure: REMOVAL BREAST IMPLANTS WITH DEBRIDEMENT OF BREAST FLAP;  Surgeon: Irene Limbo, MD;  Location: [place];  Service:  Plastics;  Laterality: Left;  . BREAST RECONSTRUCTION WITH PLACEMENT OF TISSUE EXPANDER AND FLEX HD (ACELLULAR HYDRATED DERMIS) Bilateral 10/29/2016   Procedure: BILATERAL BREAST RECONSTRUCTION WITH PLACEMENT OF TISSUE EXPANDER AND  CORTIVA;  Surgeon: Irene Limbo, MD;  Location: [place];  Service: Plastics;  Laterality: Bilateral;  . EXTERNAL FIXATION LEG Left 10/14/2014   Procedure: EXTERNAL FIXATION LEFT ANKLE;  Surgeon: Marianna Payment, MD;  Location: [place];  Service: Orthopedics;  Laterality: Left;  . EXTERNAL FIXATION REMOVAL Left 10/19/2014   Procedure: REMOVAL EXTERNAL FIXATION LEG;  Surgeon: Newt Minion, MD;  Location: [place];  Service: Orthopedics;  Laterality: Left;  . I&D EXTREMITY Left 10/14/2014   Procedure: IRRIGATION AND DEBRIDEMENT OPEN TIBIA FRACTURE LEFT LEG;  Surgeon: Marianna Payment, MD;  Location: [place];  Service: Orthopedics;  Laterality: Left;  . LAPAROSCOPIC ASSISTED VAGINAL HYSTERECTOMY N/A 01/21/2013   Procedure: LAPAROSCOPIC ASSISTED VAGINAL HYSTERECTOMY;  Surgeon: Thornell Sartorius, MD;  Location: [facility];  Service: Gynecology;  Laterality: N/A;  . NIPPLE SPARING MASTECTOMY Bilateral 10/29/2016   Procedure: BILATERAL NIPPLE SPARING MASTECTOMIES;  Surgeon: Donnie Mesa, MD;  Location: [place];  Service: General;  Laterality: Bilateral;  . NOVASURE ABLATION  03/13/2007  . REMOVAL OF BILATERAL TISSUE EXPANDERS WITH PLACEMENT OF BILATERAL BREAST IMPLANTS Bilateral 01/21/2017   Procedure: REMOVAL OF BILATERAL TISSUE EXPANDERS WITH  PLACEMENT OF BILATERAL BREAST IMPLANTS;  Surgeon: Irene Limbo, MD;  Location: [place];  Service: Plastics;  Laterality: Bilateral;  . THYROID LOBECTOMY Left 10/01/2013   Procedure: THYROID LOBECTOMY;  Surgeon: Madilyn Hook, DO;  Location: [facility];  Service: General;  Laterality: Left;  . TUBAL LIGATION      There were no vitals filed for this visit.  Subjective Assessment - 10/23/17 1126    Subjective  Patient conmes in  today 20 minutes late for her appointment. She has had some difficulty with tranportation becasue of the weather. She has been weight bearing at home. She is having some pain in her right knee.     Pertinent History  anxiety, depression, arthritis, migranes     How long can you stand comfortably?  < 5 minutes     How long can you walk comfortably?  using crutches; can not weight bear     Diagnostic tests  Nothing post-op     Patient Stated Goals  To have less pain     Currently in Pain?  Yes    Pain Score  7     Pain Orientation  Left    Pain Descriptors / Indicators  Aching    Pain Type  Chronic pain    Pain Onset  More than a month ago    Pain Frequency  Constant    Aggravating Factors   weight bearing     Pain Relieving Factors  dont put weight on it     Effect of Pain on Daily Activities  difficulty walking                       [facility] Adult PT Treatment/Exercise - 10/23/17 0001      Knee/Hip Exercises: Standing   Other Standing Knee Exercises  Standing march 2x10; right hip extension 2x10; hip abduction 2x10;       Knee/Hip Exercises: Seated   Long Arc Quad Limitations  2x10 with left 2lb       Manual Therapy   Manual therapy comments  edema massage with leg elevated.       Ankle Exercises: Standing   Other Standing Ankle Exercises  weight shift forward onto the ball of her foot.       Ankle Exercises: Supine   Other Supine Ankle Exercises  SLR 2x10; side lying hip abduction 2x10;             PT Education - 10/23/17 1327    Education provided  Yes    Education Details  Updated HEP; gait training with 1 crutch     Person(s) Educated  Patient    Methods  Explanation;Demonstration;Tactile cues;Verbal cues    Comprehension  Verbalized understanding;Returned demonstration;Verbal cues required;Tactile cues required       PT Short Term Goals - 09/24/17 1651      PT SHORT TERM GOAL #1   Title  Patient will begin light weight bearing activity without  significant increase in pain     Time  2    Period  Weeks    Status  New      PT SHORT TERM GOAL #2   Title  Patient will ambualte with crutches with 2 point gait pattern without pain.     Baseline  met in clinic but avoids at home    Time  4    Period  Weeks    Status  New      PT SHORT  TERM GOAL #3   Title  patient will demsotrate 5/5 gross left lower extremity strength     Time  4    Period  Weeks    Status  New        PT Long Term Goals - 09/24/17 1653      PT LONG TERM GOAL #1   Title  Patient will go up/down 8 steps with reciprocol gait pattern in orde rto get in and out of her house     Time  8    Period  Weeks    Status  New      PT LONG TERM GOAL #2   Title  Patient will ambualte 3000' with least restrictive AD in order to improve pain     Time  8    Period  Weeks    Status  New      PT LONG TERM GOAL #3   Title  Patient will demsotrate a 64 % limitation on FOTO    Time  8    Period  Weeks    Status  New            Plan - 10/23/17 1331    Clinical Impression Statement  Swelling does not appear to be severe. She reports her ankle is too swollen to get her shoe on but her swelling may be at baseline for what it may be for a while. She was encouraged to bring her shoe next visit. She was given standing weight bearing exercises.     Clinical Presentation  Evolving    Clinical Decision Making  Moderate    Rehab Potential  Good    PT Frequency  2x / week    PT Duration  8 weeks    PT Treatment/Interventions  ADLs/Self Care Home Management;Cryotherapy;Electrical Stimulation;Iontophoresis 82m/ml Dexamethasone;Moist Heat;Ultrasound;Therapeutic exercise;Therapeutic activities;Neuromuscular re-education;Patient/family education;Passive range of motion;Manual techniques;Splinting;Taping    PT Next Visit Plan  continue working on gait traing; add SAQ;; review weight shifting; [place] with weight bearing. add quad set     PT Home Exercise  Plan  SLR; side lying SLR; LAQ; weight shift        Patient will benefit from skilled therapeutic intervention in order to improve the following deficits and impairments:  Abnormal gait, Pain, Decreased safety awareness, Decreased activity tolerance, Decreased range of motion, Decreased strength  Visit Diagnosis: Pain in left ankle and joints of left foot  Stiffness of left ankle, not elsewhere classified  Difficulty in walking, not elsewhere classified     Problem List Patient Active Problem List   Diagnosis Date Noted  . Acquired absence of breast and nipple 01/21/2017  . Fibrocystic breast changes, bilateral 10/29/2016  . Fibrocystic breast disease (FCBD) in female 04/17/2016  . Talus fracture 10/14/2014  . Preoperative clearance 04/12/2014  . Wound, surgical, infected 10/18/2013  . S/P laparoscopic assisted vaginal hysterectomy (LAVH) 01/21/2013  . Menorrhagia with regular cycle 01/20/2013  . S/P endometrial ablation 01/20/2013  . OBSTRUCTIVE SLEEP APNEA 07/20/2009  . ALLERGIC RHINITIS 07/20/2009  . ASTHMA 07/20/2009  . DYSPNEA 07/20/2009    DCarney Living PT DPT  10/23/2017, 1:54 PM  [facility] [address]Phone: [phone]  Fax:  [phone] Name: Mackenzie MACHNIKMRN: [id number] of Birth: 216-Jul-1977

## 2017-10-27 DIAGNOSIS — I10 Essential (primary) hypertension: Secondary | ICD-10-CM | POA: Diagnosis not present

## 2017-10-28 ENCOUNTER — Ambulatory Visit: Payer: Medicare Other | Admitting: Physical Therapy

## 2017-10-28 ENCOUNTER — Encounter: Payer: Self-pay | Admitting: Physical Therapy

## 2017-10-28 DIAGNOSIS — M25572 Pain in left ankle and joints of left foot: Secondary | ICD-10-CM | POA: Diagnosis not present

## 2017-10-28 DIAGNOSIS — M25672 Stiffness of left ankle, not elsewhere classified: Secondary | ICD-10-CM

## 2017-10-28 DIAGNOSIS — R262 Difficulty in walking, not elsewhere classified: Secondary | ICD-10-CM | POA: Diagnosis not present

## 2017-10-28 NOTE — Therapy (Signed)
[place], [place], [address] Phone: [phone]   Fax:  [phone]  Physical Therapy Treatment  Patient Details  Name: [name] MRN: [id number] Date of Birth: 1976/10/24 Referring Provider: Dr Kathi Simpers    Encounter Date: 10/28/2017  PT End of Session - 10/28/17 1148    Visit Number  3    Number of Visits  16    Date for PT Re-Evaluation  11/19/17    Authorization Type  UHC     PT Start Time  4315    PT Stop Time  1230    PT Time Calculation (min)  45 min       Past Medical History:  Diagnosis Date  . Anemia    no current med.  . Anxiety   . Arthritis    left ankle  . Complication of anesthesia    hx. of waking up during surgery  . Depression   . Migraines   . PONV (postoperative nausea and vomiting)   . Seasonal asthma    no current med.  . Sleep apnea    mild per pt. - no CPAP use    Past Surgical History:  Procedure Laterality Date  . ABDOMINAL HYSTERECTOMY    . ANKLE FUSION Left 10/19/2014   Procedure: Tibiocalcaneal Fusion;  Surgeon: Newt Minion, MD;  Location: [place];  Service: Orthopedics;  Laterality: Left;  . BILATERAL SALPINGECTOMY N/A 01/21/2013   Procedure: BILATERAL SALPINGECTOMY;  Surgeon: Thornell Sartorius, MD;  Location: [facility];  Service: Gynecology;  Laterality: N/A;  . BREAST IMPLANT EXCHANGE Bilateral 04/29/2017   Procedure: REVISION OF BILATERAL RECONSTRUCTION WITH SILICONE IMPLANT EXCHANGE AND ALLODERM BILATERAL BREAST;  Surgeon: Irene Limbo, MD;  Location: [facility];  Service: Plastics;  Laterality: Bilateral;  . BREAST IMPLANT REMOVAL Left 07/16/2017   Procedure: REMOVAL BREAST IMPLANTS WITH DEBRIDEMENT OF BREAST FLAP;  Surgeon: Irene Limbo, MD;  Location: [facility];  Service: Plastics;  Laterality: Left;  . BREAST RECONSTRUCTION WITH PLACEMENT OF TISSUE EXPANDER AND FLEX HD (ACELLULAR HYDRATED DERMIS) Bilateral 10/29/2016   Procedure:  BILATERAL BREAST RECONSTRUCTION WITH PLACEMENT OF TISSUE EXPANDER AND  CORTIVA;  Surgeon: Irene Limbo, MD;  Location: [facility];  Service: Plastics;  Laterality: Bilateral;  . EXTERNAL FIXATION LEG Left 10/14/2014   Procedure: EXTERNAL FIXATION LEFT ANKLE;  Surgeon: Marianna Payment, MD;  Location: [facility];  Service: Orthopedics;  Laterality: Left;  . EXTERNAL FIXATION REMOVAL Left 10/19/2014   Procedure: REMOVAL EXTERNAL FIXATION LEG;  Surgeon: Newt Minion, MD;  Location: [facility];  Service: Orthopedics;  Laterality: Left;  . I&D EXTREMITY Left 10/14/2014   Procedure: IRRIGATION AND DEBRIDEMENT OPEN TIBIA FRACTURE LEFT LEG;  Surgeon: Marianna Payment, MD;  Location: [facility];  Service: Orthopedics;  Laterality: Left;  . LAPAROSCOPIC ASSISTED VAGINAL HYSTERECTOMY N/A 01/21/2013   Procedure: LAPAROSCOPIC ASSISTED VAGINAL HYSTERECTOMY;  Surgeon: Thornell Sartorius, MD;  Location: [facility];  Service: Gynecology;  Laterality: N/A;  . NIPPLE SPARING MASTECTOMY Bilateral 10/29/2016   Procedure: BILATERAL NIPPLE SPARING MASTECTOMIES;  Surgeon: Donnie Mesa, MD;  Location: [facility];  Service: General;  Laterality: Bilateral;  . NOVASURE ABLATION  03/13/2007  . REMOVAL OF BILATERAL TISSUE EXPANDERS WITH PLACEMENT OF BILATERAL BREAST IMPLANTS Bilateral 01/21/2017   Procedure: REMOVAL OF BILATERAL TISSUE EXPANDERS WITH PLACEMENT OF BILATERAL BREAST IMPLANTS;  Surgeon: Irene Limbo, MD;  Location: [facility];  Service: Plastics;  Laterality: Bilateral;  . THYROID LOBECTOMY Left 10/01/2013  Procedure: THYROID LOBECTOMY;  Surgeon: Madilyn Hook, DO;  Location: [facility];  Service: General;  Laterality: Left;  . TUBAL LIGATION      There were no vitals filed for this visit.  Subjective Assessment - 10/28/17 1146    Subjective  A little pain on my heel and the outside of the ankle.     Currently in Pain?  Yes    Pain Score  7     Pain Location  Heel    Pain Orientation  Other (Comment)    Pain Descriptors  / Indicators  Throbbing;Sore    Pain Type  Chronic pain    Aggravating Factors   walking, massaging it-its tender     Pain Relieving Factors  leave it alone                       [facility] Adult PT Treatment/Exercise - 10/28/17 0001      Knee/Hip Exercises: Standing   Heel Raises  10 reps toe raises     Gait Training  in parallel bars gait with SPC and bars, attempting step through pattern, difficulty coordinating. usually uses AD in LUE- encourged to use in RUE     Other Standing Knee Exercises  tandem stand requiring UE support intermittently, alternating step taps on 6 inch step, step ups forward and lateral on 4 inch step ups    Other Standing Knee Exercises  Standing march 2x10; right hip extension 2x10; hip abduction 2x10;  also on left               PT Short Term Goals - 09/24/17 1651      PT SHORT TERM GOAL #1   Title  Patient will begin light weight bearing activity without significant increase in pain     Time  2    Period  Weeks    Status  New      PT SHORT TERM GOAL #2   Title  Patient will ambualte with crutches with 2 point gait pattern without pain.     Baseline  met in clinic but avoids at home    Time  4    Period  Weeks    Status  New      PT SHORT TERM GOAL #3   Title  patient will demsotrate 5/5 gross left lower extremity strength     Time  4    Period  Weeks    Status  New        PT Long Term Goals - 09/24/17 1653      PT LONG TERM GOAL #1   Title  Patient will go up/down 8 steps with reciprocol gait pattern in orde rto get in and out of her house     Time  8    Period  Weeks    Status  New      PT LONG TERM GOAL #2   Title  Patient will ambualte 3000' with least restrictive AD in order to improve pain     Time  8    Period  Weeks    Status  New      PT LONG TERM GOAL #3   Title  Patient will demsotrate a 64 % limitation on FOTO    Time  8    Period  Weeks    Status  New            Plan - 10/28/17 1247     Clinical  Impression Statement  Pt reports sometimes she walks to the store and is pain for 2 days afterward. She is ambulating today with boot and 1 crutch on the surgical side. Time spent with gait training using AD on correct side to normalize gait. Spent treatment in socks due to pt unable to find shoe that fits. She is ambulating in home without shoes. Possible LLD? Decreased tolerance to weight bearing exercise with pt c/o increased heel pain at end of session. She declined ice.    PT Next Visit Plan  check pelvic alignment vs LLD; continue working on gait traing; add SAQ;; review weight shifting; work on [place] with 1/2 crutches with weight bearing. add quad set     PT Home Exercise Plan  SLR; side lying SLR; LAQ; weight shift,     Consulted and Agree with Plan of Care  Patient       Patient will benefit from skilled therapeutic intervention in order to improve the following deficits and impairments:  Abnormal gait, Pain, Decreased safety awareness, Decreased activity tolerance, Decreased range of motion, Decreased strength  Visit Diagnosis: Pain in left ankle and joints of left foot  Difficulty in walking, not elsewhere classified  Stiffness of left ankle, not elsewhere classified     Problem List Patient Active Problem List   Diagnosis Date Noted  . Acquired absence of breast and nipple 01/21/2017  . Fibrocystic breast changes, bilateral 10/29/2016  . Fibrocystic breast disease (FCBD) in female 04/17/2016  . Talus fracture 10/14/2014  . Preoperative clearance 04/12/2014  . Wound, surgical, infected 10/18/2013  . S/P laparoscopic assisted vaginal hysterectomy (LAVH) 01/21/2013  . Menorrhagia with regular cycle 01/20/2013  . S/P endometrial ablation 01/20/2013  . OBSTRUCTIVE SLEEP APNEA 07/20/2009  . ALLERGIC RHINITIS 07/20/2009  . ASTHMA 07/20/2009  . DYSPNEA 07/20/2009    Dorene Ar, PTA 10/28/2017, 12:51 PM  [facility] [address] [place], [place], [address] Phone: [phone]   Fax:  [phone]  Name: [name] MRN: [id number] Date of Birth: 05/29/1976

## 2017-10-30 ENCOUNTER — Ambulatory Visit: Payer: Medicare Other | Admitting: Physical Therapy

## 2017-10-30 ENCOUNTER — Encounter: Payer: Self-pay | Admitting: Physical Therapy

## 2017-10-30 DIAGNOSIS — R262 Difficulty in walking, not elsewhere classified: Secondary | ICD-10-CM | POA: Diagnosis not present

## 2017-10-30 DIAGNOSIS — M25672 Stiffness of left ankle, not elsewhere classified: Secondary | ICD-10-CM | POA: Diagnosis not present

## 2017-10-30 DIAGNOSIS — M25572 Pain in left ankle and joints of left foot: Secondary | ICD-10-CM | POA: Diagnosis not present

## 2017-10-30 NOTE — Therapy (Addendum)
[place], [place], [address] Phone: [phone]   Fax:  [phone]  Physical Therapy Treatment / Discharge   Patient Details  Name: [name] MRN: [id number] Date of Birth: 06-10-1976 Referring Provider: Dr Kathi Simpers    Encounter Date: 10/30/2017  PT End of Session - 10/30/17 1108    Visit Number  4    Number of Visits  16    Date for PT Re-Evaluation  11/19/17    Authorization Type  UHC     PT Start Time  1100    PT Stop Time  1145    PT Time Calculation (min)  45 min       Past Medical History:  Diagnosis Date  . Anemia    no current med.  . Anxiety   . Arthritis    left ankle  . Complication of anesthesia    hx. of waking up during surgery  . Depression   . Migraines   . PONV (postoperative nausea and vomiting)   . Seasonal asthma    no current med.  . Sleep apnea    mild per pt. - no CPAP use    Past Surgical History:  Procedure Laterality Date  . ABDOMINAL HYSTERECTOMY    . ANKLE FUSION Left 10/19/2014   Procedure: Tibiocalcaneal Fusion;  Surgeon: Newt Minion, MD;  Location: [place];  Service: Orthopedics;  Laterality: Left;  . BILATERAL SALPINGECTOMY N/A 01/21/2013   Procedure: BILATERAL SALPINGECTOMY;  Surgeon: Thornell Sartorius, MD;  Location: [facility];  Service: Gynecology;  Laterality: N/A;  . BREAST IMPLANT EXCHANGE Bilateral 04/29/2017   Procedure: REVISION OF BILATERAL RECONSTRUCTION WITH SILICONE IMPLANT EXCHANGE AND ALLODERM BILATERAL BREAST;  Surgeon: Irene Limbo, MD;  Location: [facility];  Service: Plastics;  Laterality: Bilateral;  . BREAST IMPLANT REMOVAL Left 07/16/2017   Procedure: REMOVAL BREAST IMPLANTS WITH DEBRIDEMENT OF BREAST FLAP;  Surgeon: Irene Limbo, MD;  Location: [facility];  Service: Plastics;  Laterality: Left;  . BREAST RECONSTRUCTION WITH PLACEMENT OF TISSUE EXPANDER AND FLEX HD (ACELLULAR HYDRATED DERMIS) Bilateral 10/29/2016    Procedure: BILATERAL BREAST RECONSTRUCTION WITH PLACEMENT OF TISSUE EXPANDER AND  CORTIVA;  Surgeon: Irene Limbo, MD;  Location: [facility];  Service: Plastics;  Laterality: Bilateral;  . EXTERNAL FIXATION LEG Left 10/14/2014   Procedure: EXTERNAL FIXATION LEFT ANKLE;  Surgeon: Marianna Payment, MD;  Location: [facility];  Service: Orthopedics;  Laterality: Left;  . EXTERNAL FIXATION REMOVAL Left 10/19/2014   Procedure: REMOVAL EXTERNAL FIXATION LEG;  Surgeon: Newt Minion, MD;  Location: [facility];  Service: Orthopedics;  Laterality: Left;  . I&D EXTREMITY Left 10/14/2014   Procedure: IRRIGATION AND DEBRIDEMENT OPEN TIBIA FRACTURE LEFT LEG;  Surgeon: Marianna Payment, MD;  Location: [facility];  Service: Orthopedics;  Laterality: Left;  . LAPAROSCOPIC ASSISTED VAGINAL HYSTERECTOMY N/A 01/21/2013   Procedure: LAPAROSCOPIC ASSISTED VAGINAL HYSTERECTOMY;  Surgeon: Thornell Sartorius, MD;  Location: [facility];  Service: Gynecology;  Laterality: N/A;  . NIPPLE SPARING MASTECTOMY Bilateral 10/29/2016   Procedure: BILATERAL NIPPLE SPARING MASTECTOMIES;  Surgeon: Donnie Mesa, MD;  Location: [facility];  Service: General;  Laterality: Bilateral;  . NOVASURE ABLATION  03/13/2007  . REMOVAL OF BILATERAL TISSUE EXPANDERS WITH PLACEMENT OF BILATERAL BREAST IMPLANTS Bilateral 01/21/2017   Procedure: REMOVAL OF BILATERAL TISSUE EXPANDERS WITH PLACEMENT OF BILATERAL BREAST IMPLANTS;  Surgeon: Irene Limbo, MD;  Location: [facility];  Service: Plastics;  Laterality: Bilateral;  . THYROID  LOBECTOMY Left 10/01/2013   Procedure: THYROID LOBECTOMY;  Surgeon: Madilyn Hook, DO;  Location[facility];  Service: General;  Laterality: Left;  . TUBAL LIGATION      There were no vitals filed for this visit.  Subjective Assessment - 10/30/17 1106    Subjective  Tired after last visit but no increased pain. Tried to wear shoe this morning, the pressure hurt my heel.     Currently in Pain?  Yes    Pain Score  6     Pain Location   Heel                      [facility] Adult PT Treatment/Exercise - 10/30/17 0001      Manual Therapy   Manual therapy comments  metatarsal mobs, toe flexion and extension, plantar massage       Ankle Exercises: Aerobic   Stationary Bike  Rec bike L2 x 7 minutes HR 136 bpm      Ankle Exercises: Seated   Other Seated Ankle Exercises  Toe Yoga                PT Short Term Goals - 09/24/17 1651      PT SHORT TERM GOAL #1   Title  Patient will begin light weight bearing activity without significant increase in pain     Time  2    Period  Weeks    Status  New      PT SHORT TERM GOAL #2   Title  Patient will ambualte with crutches with 2 point gait pattern without pain.     Baseline  met in clinic but avoids at home    Time  4    Period  Weeks    Status  New      PT SHORT TERM GOAL #3   Title  patient will demsotrate 5/5 gross left lower extremity strength     Time  4    Period  Weeks    Status  New        PT Long Term Goals - 09/24/17 1653      PT LONG TERM GOAL #1   Title  Patient will go up/down 8 steps with reciprocol gait pattern in orde rto get in and out of her house     Time  8    Period  Weeks    Status  New      PT LONG TERM GOAL #2   Title  Patient will ambualte 3000' with least restrictive AD in order to improve pain     Time  8    Period  Weeks    Status  New      PT LONG TERM GOAL #3   Title  Patient will demsotrate a 64 % limitation on FOTO    Time  8    Period  Weeks    Status  New            Plan - 10/30/17 1108    Clinical Impression Statement  Pt reports she was fatigued after last visit but did not have increased ankle or foot pain. She arrives in boot and states she tried a shoe this morning but was too much pain on heel. Asked to bring the shoe next visit and to work on weight shifting in her shoe at home and use bilateral cruthes with heel toe gait pattern. Also gave her a tennis ball for self massage to plantar  surface, worked on toe yoga. Began recumbent bike without shoe. HR 136 bpm.     PT Next Visit Plan  check pelvic alignment vs LLD; continue working on gait traing; add SAQ;; review weight shifting; work on [place] with 1/2 crutches with weight bearing. add quad set     PT Home Exercise Plan  SLR; side lying SLR; LAQ; weight shift in shoe, tennis ball plantar massage,     Consulted and Agree with Plan of Care  Patient       Patient will benefit from skilled therapeutic intervention in order to improve the following deficits and impairments:  Abnormal gait, Pain, Decreased safety awareness, Decreased activity tolerance, Decreased range of motion, Decreased strength  Visit Diagnosis: Pain in left ankle and joints of left foot  Difficulty in walking, not elsewhere classified  Stiffness of left ankle, not elsewhere classified  PHYSICAL THERAPY DISCHARGE SUMMARY  Visits from Start of Care: 4  Current functional level related to goals / functional outcomes: Continued pain. Did Not return for follow up    Remaining deficits: Continued pain    Education / Equipment: HEP  Plan: Patient agrees to discharge.  Patient goals were not met. Patient is being discharged due to not returning since the last visit.  ?????       Problem List Patient Active Problem List   Diagnosis Date Noted  . Acquired absence of breast and nipple 01/21/2017  . Fibrocystic breast changes, bilateral 10/29/2016  . Fibrocystic breast disease (FCBD) in female 04/17/2016  . Talus fracture 10/14/2014  . Preoperative clearance 04/12/2014  . Wound, surgical, infected 10/18/2013  . S/P laparoscopic assisted vaginal hysterectomy (LAVH) 01/21/2013  . Menorrhagia with regular cycle 01/20/2013  . S/P endometrial ablation 01/20/2013  . OBSTRUCTIVE SLEEP APNEA 07/20/2009  . ALLERGIC RHINITIS 07/20/2009  . ASTHMA 07/20/2009  . DYSPNEA 07/20/2009   Carolyne Littles PT DPT  12/31/2017  Dorene Ar,  PTA 10/30/2017, 12:14 PM  [facility] [address] [place], [place], [address] Phone: [phone]   Fax:  [phone]  Name: [name] MRN: [id number] Date of Birth: April 13, 1976

## 2017-11-03 ENCOUNTER — Ambulatory Visit: Payer: Medicare Other | Admitting: Physical Therapy

## 2017-11-05 ENCOUNTER — Ambulatory Visit: Payer: Medicare Other | Admitting: Physical Therapy

## 2017-11-17 DIAGNOSIS — Z981 Arthrodesis status: Secondary | ICD-10-CM | POA: Diagnosis not present

## 2017-11-17 DIAGNOSIS — M79672 Pain in left foot: Secondary | ICD-10-CM | POA: Diagnosis not present

## 2017-11-17 DIAGNOSIS — M19172 Post-traumatic osteoarthritis, left ankle and foot: Secondary | ICD-10-CM | POA: Diagnosis not present

## 2017-11-17 DIAGNOSIS — Z7689 Persons encountering health services in other specified circumstances: Secondary | ICD-10-CM | POA: Diagnosis not present

## 2017-11-26 DIAGNOSIS — Z9013 Acquired absence of bilateral breasts and nipples: Secondary | ICD-10-CM | POA: Diagnosis not present

## 2017-11-26 DIAGNOSIS — Z9886 Personal history of breast implant removal: Secondary | ICD-10-CM | POA: Diagnosis not present

## 2017-11-26 DIAGNOSIS — T8549XS Other mechanical complication of breast prosthesis and implant, sequela: Secondary | ICD-10-CM | POA: Diagnosis not present

## 2017-12-26 NOTE — H&P (Signed)
Subjective:     Patient ID: [name] is a 42 y.o. female.  HPI   8 months post op revision for severe rippling and inferior displacement. Fever noted POD#7, seroma fluid aspirated anterior to left implant and culture of this Serratia, sensitive to Bactrim. Presented at 2.5 month visit with gross implant exposure and underwent debridement mastectomy flap and removal implant 07/2017.  Presented to [facility] ED 03/2016 with c/o breast mass, reported she could not get MMG as she had no PCP. Subsequest establishment to GYN and referral for MMG/US. MMG and [place] benign, palpable mass cyst, breast density D. She has had history fibrocystic disease and prior aspirations/resections. 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 Lives with adult daughter. She quit smoking 06/2016  Prior 36 B, desires more lifted, "C" cup. Right mastectomy 453 g Left 346 g.   Review of Systems    Objective:   Physical Exam  Cardiovascular: Normal rate, regular rhythm and normal heart sounds.   Pulmonary/Chest: Effort normal and breath sounds normal.   Chest: right no masses soft rippling present l eft chest with contracted soft tissue flap  SN to nipple R 18 L 15 cm Lateral displacement NAC on right- chest midline to nipple 12.5 cm BW R 11.5 L 11 cm Nipple to IMF R 8 L 4 cm to IMF scar   Assessment:     Dense breasts, Family history breast ca S/p bilateral NSM, TE/ADM (Cortiva) reconstruction S/p silicone implant exchange S/p revision with HCG implant exchange, ADM (Alloderm) bilateral Left breast implant extrusion/erosion with gross exposure s/p removal and debdridement.    Plan:    Reviewed I suspect low level infection vs non incorporation ADM that led to ulceration and extrusion. Patient does not have al lot of confidence with ADM at this time. Plan TE placement. We have  discussed LD flap in past t given this history ulceration through flap. She would like to proceed with this. Reviewed back incision, hospital stay, risk weakening of strength motion of her dominant LUE, risk seroma back, risk infection, extrusion.   Natrelle Inspira Smooth Round Cohesive gel implants 400 ml placed . REF [id number]    Irene Limbo, MD [facility] [phone], pin [phone]

## 2017-12-29 NOTE — Pre-Procedure Instructions (Signed)
[name]  12/29/2017      [place], [place] [address] Phone: [phone] Fax: [phone]    Your procedure is scheduled on Feb 22  Report to [place] at 530 A.M.  Call this number if you have problems the morning of surgery:  [phone]   Remember:  Do not eat food or drink liquids after midnight.  Take these medicines the morning of surgery with A SIP OF WATER Ondansetron (Zofran) if needed  Stop taking aspirin as directed by your Dr.  Stop taking BC's, Goody's, Herbal medications, Fish Oil, Ibuprofen, Advil, Motrin, Aleve, Vitamins   Do not wear jewelry, make-up or nail polish.  Do not wear lotions, powders, or perfumes, or deodorant.  Do not shave 48 hours prior to surgery.  Men may shave face and neck.  Do not bring valuables to the hospital.  [facility] is not responsible for any belongings or valuables.  Contacts, dentures or bridgework may not be worn into surgery.  Leave your suitcase in the car.  After surgery it may be brought to your room.  For patients admitted to the hospital, discharge time will be determined by your treatment team.  Patients discharged the day of surgery will not be allowed to drive home.  Special instructions:  [facility] - Preparing for Surgery  Before surgery, you can play an important role.  Because skin is not sterile, your skin needs to be as free of germs as possible.  You can reduce the number of germs on you skin by washing with CHG (chlorahexidine gluconate) soap before surgery.  CHG is an antiseptic cleaner which kills germs and bonds with the skin to continue killing germs even after washing.  Please DO NOT use if you have an allergy to CHG or antibacterial soaps.  If your skin becomes reddened/irritated stop using the CHG and inform your nurse when you arrive at Short Stay.  Do not shave (including legs and  underarms) for at least 48 hours prior to the first CHG shower.  You may shave your face.  Please follow these instructions carefully:   1.  Shower with CHG Soap the night before surgery and the  morning of Surgery.  2.  If you choose to wash your hair, wash your hair first as usual with your  normal shampoo.  3.  After you shampoo, rinse your hair and body thoroughly to remove the   Shampoo.  4.  Use CHG as you would any other liquid soap.  You can apply chg directly   to the skin and wash gently with scrungie or a clean washcloth.  5.  Apply the CHG Soap to your body ONLY FROM THE NECK DOWN.  Do not use on open wounds or open sores.  Avoid contact with your eyes, ears, mouth and genitals (private parts).  Wash genitals (private parts)   with your normal soap.  6.  Wash thoroughly, paying special attention to the area where your surgery  will be performed.  7.  Thoroughly rinse your body with warm water from the neck down.  8.  DO NOT shower/wash with your normal soap after using and rinsing off the CHG Soap.  9.  Pat yourself dry with a clean towel.            10.  Wear clean pajamas.  11.  Place clean sheets on your bed the night of your first shower and do not   sleep with pets.  Day of Surgery  Do not apply any lotions/deoderants the morning of surgery.  Please wear clean clothes to the hospital/surgery center.     Please read over the following fact sheets that you were given. Pain Booklet, Coughing and Deep Breathing and Surgical Site Infection Prevention

## 2017-12-30 ENCOUNTER — Encounter (HOSPITAL_COMMUNITY): Payer: Self-pay

## 2017-12-30 ENCOUNTER — Other Ambulatory Visit: Payer: Self-pay

## 2017-12-30 ENCOUNTER — Encounter (HOSPITAL_COMMUNITY)
Admission: RE | Admit: 2017-12-30 | Discharge: 2017-12-30 | Disposition: A | Discharge status: Home / Self Care | Payer: Medicare Other | Source: Ambulatory Visit | Attending: Plastic Surgery | Admitting: Plastic Surgery

## 2017-12-30 DIAGNOSIS — Z7982 Long term (current) use of aspirin: Secondary | ICD-10-CM | POA: Diagnosis not present

## 2017-12-30 DIAGNOSIS — F17200 Nicotine dependence, unspecified, uncomplicated: Secondary | ICD-10-CM | POA: Diagnosis not present

## 2017-12-30 DIAGNOSIS — N650 Deformity of reconstructed breast: Secondary | ICD-10-CM | POA: Diagnosis present

## 2017-12-30 DIAGNOSIS — Z79899 Other long term (current) drug therapy: Secondary | ICD-10-CM | POA: Diagnosis not present

## 2017-12-30 DIAGNOSIS — G4730 Sleep apnea, unspecified: Secondary | ICD-10-CM | POA: Diagnosis not present

## 2017-12-30 DIAGNOSIS — R51 Headache: Secondary | ICD-10-CM | POA: Diagnosis not present

## 2017-12-30 DIAGNOSIS — M1990 Unspecified osteoarthritis, unspecified site: Secondary | ICD-10-CM | POA: Diagnosis not present

## 2017-12-30 DIAGNOSIS — F418 Other specified anxiety disorders: Secondary | ICD-10-CM | POA: Diagnosis not present

## 2017-12-30 DIAGNOSIS — Z23 Encounter for immunization: Secondary | ICD-10-CM | POA: Diagnosis not present

## 2017-12-30 DIAGNOSIS — J45909 Unspecified asthma, uncomplicated: Secondary | ICD-10-CM | POA: Diagnosis not present

## 2017-12-30 DIAGNOSIS — Z803 Family history of malignant neoplasm of breast: Secondary | ICD-10-CM | POA: Diagnosis not present

## 2017-12-30 LAB — BASIC METABOLIC PANEL
Anion gap: 12 (ref 5–15)
BUN: 7 mg/dL (ref 6–20)
CHLORIDE: 105 mmol/L (ref 101–111)
CO2: 24 mmol/L (ref 22–32)
Calcium: 8.8 mg/dL — ABNORMAL LOW (ref 8.9–10.3)
Creatinine, Ser: 0.98 mg/dL (ref 0.44–1.00)
GFR calc non Af Amer: >60 mL/min (ref >60)
Glucose, Bld: 87 mg/dL (ref 65–99)
Potassium: 3.8 mmol/L (ref 3.5–5.1)
SODIUM: 141 mmol/L (ref 135–145)

## 2017-12-30 LAB — CBC
HEMATOCRIT: 39.4 % (ref 36.0–46.0)
HEMOGLOBIN: 13.2 g/dL (ref 12.0–15.0)
MCH: 29.6 pg (ref 26.0–34.0)
MCHC: 33.5 g/dL (ref 30.0–36.0)
MCV: 88.3 fL (ref 78.0–100.0)
Platelets: 187 10*3/uL (ref 150–400)
RBC: 4.46 MIL/uL (ref 3.87–5.11)
RDW: 13.3 % (ref 11.5–15.5)
WBC: 5.8 10*3/uL (ref 4.0–10.5)

## 2017-12-30 NOTE — Progress Notes (Signed)
PCP is Dr Lucianne Lei Denies ever seeing a cardiologist. Denies ever having a card cath, stress test, or echo. Denies chest pain, cough, or fever. Reports last dose of aspirin was in Jan 2019

## 2018-01-02 ENCOUNTER — Encounter (HOSPITAL_COMMUNITY): Payer: Self-pay | Admitting: *Deleted

## 2018-01-02 ENCOUNTER — Inpatient Hospital Stay (HOSPITAL_COMMUNITY): Payer: Medicare Other | Admitting: Anesthesiology

## 2018-01-02 ENCOUNTER — Encounter (HOSPITAL_COMMUNITY)
Admission: RE | Disposition: A | Discharge status: Home / Self Care | Payer: Self-pay | Source: Ambulatory Visit | Attending: Plastic Surgery

## 2018-01-02 ENCOUNTER — Observation Stay (HOSPITAL_COMMUNITY)
Admission: RE | Admit: 2018-01-02 | Discharge: 2018-01-03 | Disposition: A | Discharge status: Home / Self Care | Payer: Medicare Other | Source: Ambulatory Visit | Attending: Plastic Surgery | Admitting: Plastic Surgery

## 2018-01-02 ENCOUNTER — Other Ambulatory Visit: Payer: Self-pay

## 2018-01-02 DIAGNOSIS — N6019 Diffuse cystic mastopathy of unspecified breast: Secondary | ICD-10-CM | POA: Diagnosis not present

## 2018-01-02 DIAGNOSIS — Z9013 Acquired absence of bilateral breasts and nipples: Secondary | ICD-10-CM | POA: Diagnosis not present

## 2018-01-02 DIAGNOSIS — Z23 Encounter for immunization: Secondary | ICD-10-CM | POA: Diagnosis not present

## 2018-01-02 DIAGNOSIS — Z79899 Other long term (current) drug therapy: Secondary | ICD-10-CM | POA: Insufficient documentation

## 2018-01-02 DIAGNOSIS — M1990 Unspecified osteoarthritis, unspecified site: Secondary | ICD-10-CM | POA: Insufficient documentation

## 2018-01-02 DIAGNOSIS — F17200 Nicotine dependence, unspecified, uncomplicated: Secondary | ICD-10-CM | POA: Insufficient documentation

## 2018-01-02 DIAGNOSIS — Z803 Family history of malignant neoplasm of breast: Secondary | ICD-10-CM | POA: Insufficient documentation

## 2018-01-02 DIAGNOSIS — F418 Other specified anxiety disorders: Secondary | ICD-10-CM | POA: Insufficient documentation

## 2018-01-02 DIAGNOSIS — N650 Deformity of reconstructed breast: Principal | ICD-10-CM | POA: Insufficient documentation

## 2018-01-02 DIAGNOSIS — R51 Headache: Secondary | ICD-10-CM | POA: Insufficient documentation

## 2018-01-02 DIAGNOSIS — J45909 Unspecified asthma, uncomplicated: Secondary | ICD-10-CM | POA: Insufficient documentation

## 2018-01-02 DIAGNOSIS — G4730 Sleep apnea, unspecified: Secondary | ICD-10-CM | POA: Insufficient documentation

## 2018-01-02 DIAGNOSIS — Z7982 Long term (current) use of aspirin: Secondary | ICD-10-CM | POA: Insufficient documentation

## 2018-01-02 DIAGNOSIS — Z9010 Acquired absence of unspecified breast and nipple: Secondary | ICD-10-CM

## 2018-01-02 HISTORY — DX: Malignant neoplasm of unspecified site of right female breast: C50.911

## 2018-01-02 HISTORY — PX: LATISSIMUS FLAP TO BREAST: SHX5357

## 2018-01-02 HISTORY — DX: Malignant neoplasm of unspecified site of left female breast: C50.912

## 2018-01-02 HISTORY — PX: RECONSTRUCTION BREAST IMMEDIATE / DELAYED W/ TISSUE EXPANDER: SUR1077

## 2018-01-02 HISTORY — PX: TISSUE EXPANDER PLACEMENT: SHX2530

## 2018-01-02 HISTORY — DX: Unspecified chronic bronchitis: J42

## 2018-01-02 SURGERY — RECONSTRUCTION, BREAST, USING LATISSIMUS DORSI MYOCUTANEOUS FLAP
Anesthesia: General | Laterality: Left

## 2018-01-02 MED ORDER — TRAZODONE HCL 100 MG PO TABS
100.0000 mg | ORAL_TABLET | Freq: Every day | ORAL | Status: DC
  Administered 2018-01-02: 100 mg via ORAL
  Filled 2018-01-02: qty 1

## 2018-01-02 MED ORDER — ONDANSETRON HCL 4 MG/2ML IJ SOLN
INTRAMUSCULAR | Status: DC | PRN
  Administered 2018-01-02: 4 mg via INTRAVENOUS

## 2018-01-02 MED ORDER — BOOST / RESOURCE BREEZE PO LIQD CUSTOM: 1.0000 | Freq: Three times a day (TID) | ORAL | Status: DC

## 2018-01-02 MED ORDER — CHLORHEXIDINE GLUCONATE CLOTH 2 % EX PADS: 6.0000 | MEDICATED_PAD | Freq: Once | CUTANEOUS | Status: DC

## 2018-01-02 MED ORDER — ONDANSETRON HCL 4 MG/2ML IJ SOLN: 4.0000 mg | Freq: Four times a day (QID) | INTRAMUSCULAR | Status: DC | PRN

## 2018-01-02 MED ORDER — FENTANYL CITRATE (PF) 250 MCG/5ML IJ SOLN
INTRAMUSCULAR | Status: AC
  Filled 2018-01-02: qty 5

## 2018-01-02 MED ORDER — DIPHENHYDRAMINE HCL 25 MG PO CAPS
50.0000 mg | ORAL_CAPSULE | Freq: Three times a day (TID) | ORAL | Status: DC | PRN
  Filled 2018-01-02: qty 2

## 2018-01-02 MED ORDER — ACETAMINOPHEN 325 MG PO TABS: 325.0000 mg | ORAL_TABLET | ORAL | Status: DC | PRN

## 2018-01-02 MED ORDER — GABAPENTIN 300 MG PO CAPS
300.0000 mg | ORAL_CAPSULE | ORAL | Status: AC
  Administered 2018-01-02: 300 mg via ORAL

## 2018-01-02 MED ORDER — HEPARIN SODIUM (PORCINE) 5000 UNIT/ML IJ SOLN
INTRAMUSCULAR | Status: AC
  Administered 2018-01-02: 5000 [IU] via SUBCUTANEOUS
  Filled 2018-01-02: qty 1

## 2018-01-02 MED ORDER — OXYCODONE-ACETAMINOPHEN 5-325 MG PO TABS
1.0000 | ORAL_TABLET | ORAL | Status: DC | PRN
  Administered 2018-01-03 (×2): 2 via ORAL
  Filled 2018-01-02 (×3): qty 2

## 2018-01-02 MED ORDER — FENTANYL CITRATE (PF) 100 MCG/2ML IJ SOLN
25.0000 ug | INTRAMUSCULAR | Status: DC | PRN
  Administered 2018-01-02 (×3): 50 ug via INTRAVENOUS

## 2018-01-02 MED ORDER — ACETAMINOPHEN 500 MG PO TABS
1000.0000 mg | ORAL_TABLET | ORAL | Status: AC
  Administered 2018-01-02: 1000 mg via ORAL

## 2018-01-02 MED ORDER — CEFAZOLIN SODIUM-DEXTROSE 2-4 GM/100ML-% IV SOLN
2.0000 g | INTRAVENOUS | Status: AC
  Administered 2018-01-02: 2 g via INTRAVENOUS

## 2018-01-02 MED ORDER — HYDROMORPHONE HCL 1 MG/ML IJ SOLN: 0.5000 mg | INTRAMUSCULAR | Status: DC | PRN

## 2018-01-02 MED ORDER — SCOPOLAMINE 1 MG/3DAYS TD PT72
MEDICATED_PATCH | TRANSDERMAL | Status: AC
  Filled 2018-01-02: qty 1

## 2018-01-02 MED ORDER — SCOPOLAMINE 1 MG/3DAYS TD PT72
MEDICATED_PATCH | TRANSDERMAL | Status: DC | PRN
  Administered 2018-01-02: 1 via TRANSDERMAL

## 2018-01-02 MED ORDER — ROCURONIUM BROMIDE 10 MG/ML (PF) SYRINGE
PREFILLED_SYRINGE | INTRAVENOUS | Status: AC
  Filled 2018-01-02: qty 5

## 2018-01-02 MED ORDER — METHOCARBAMOL 500 MG PO TABS
500.0000 mg | ORAL_TABLET | Freq: Three times a day (TID) | ORAL | Status: DC | PRN
  Administered 2018-01-03: 500 mg via ORAL
  Filled 2018-01-02 (×2): qty 1

## 2018-01-02 MED ORDER — CELECOXIB 200 MG PO CAPS
ORAL_CAPSULE | ORAL | Status: AC
  Administered 2018-01-02: 200 mg via ORAL
  Filled 2018-01-02: qty 1

## 2018-01-02 MED ORDER — PROPOFOL 10 MG/ML IV BOLUS
INTRAVENOUS | Status: DC | PRN
  Administered 2018-01-02: 140 mg via INTRAVENOUS
  Administered 2018-01-02: 10 mg via INTRAVENOUS
  Administered 2018-01-02: 50 mg via INTRAVENOUS

## 2018-01-02 MED ORDER — CEFAZOLIN SODIUM-DEXTROSE 1-4 GM/50ML-% IV SOLN
1.0000 g | Freq: Three times a day (TID) | INTRAVENOUS | Status: AC
  Administered 2018-01-02 – 2018-01-03 (×3): 1 g via INTRAVENOUS
  Filled 2018-01-02 (×3): qty 50

## 2018-01-02 MED ORDER — MIDAZOLAM HCL 2 MG/2ML IJ SOLN
INTRAMUSCULAR | Status: AC
  Filled 2018-01-02: qty 2

## 2018-01-02 MED ORDER — BUPIVACAINE LIPOSOME 1.3 % IJ SUSP
INTRAMUSCULAR | Status: DC | PRN
  Administered 2018-01-02: 20 mL

## 2018-01-02 MED ORDER — FENTANYL CITRATE (PF) 100 MCG/2ML IJ SOLN
INTRAMUSCULAR | Status: AC
  Filled 2018-01-02: qty 2

## 2018-01-02 MED ORDER — DEXAMETHASONE SODIUM PHOSPHATE 10 MG/ML IJ SOLN
INTRAMUSCULAR | Status: DC | PRN
  Administered 2018-01-02: 10 mg via INTRAVENOUS

## 2018-01-02 MED ORDER — PHENYLEPHRINE HCL 10 MG/ML IJ SOLN
INTRAMUSCULAR | Status: DC | PRN
  Administered 2018-01-02 (×3): 80 ug via INTRAVENOUS

## 2018-01-02 MED ORDER — OXYCODONE HCL 5 MG/5ML PO SOLN: 5.0000 mg | Freq: Once | ORAL | Status: DC | PRN

## 2018-01-02 MED ORDER — SODIUM CHLORIDE 0.9 % IV SOLN
INTRAVENOUS | Status: DC | PRN
  Administered 2018-01-02: 09:00:00

## 2018-01-02 MED ORDER — FENTANYL CITRATE (PF) 100 MCG/2ML IJ SOLN
INTRAMUSCULAR | Status: DC | PRN
  Administered 2018-01-02: 50 ug via INTRAVENOUS
  Administered 2018-01-02: 150 ug via INTRAVENOUS
  Administered 2018-01-02 (×2): 50 ug via INTRAVENOUS

## 2018-01-02 MED ORDER — 0.9 % SODIUM CHLORIDE (POUR BTL) OPTIME
TOPICAL | Status: DC | PRN
  Administered 2018-01-02: 2000 mL

## 2018-01-02 MED ORDER — ONDANSETRON HCL 4 MG/2ML IJ SOLN
INTRAMUSCULAR | Status: AC
  Filled 2018-01-02: qty 2

## 2018-01-02 MED ORDER — ONDANSETRON 4 MG PO TBDP: 4.0000 mg | ORAL_TABLET | Freq: Four times a day (QID) | ORAL | Status: DC | PRN

## 2018-01-02 MED ORDER — PHENYLEPHRINE 40 MCG/ML (10ML) SYRINGE FOR IV PUSH (FOR BLOOD PRESSURE SUPPORT)
PREFILLED_SYRINGE | INTRAVENOUS | Status: AC
  Filled 2018-01-02: qty 10

## 2018-01-02 MED ORDER — LIDOCAINE HCL (CARDIAC) 20 MG/ML IV SOLN
INTRAVENOUS | Status: DC | PRN
  Administered 2018-01-02: 60 mg via INTRAVENOUS

## 2018-01-02 MED ORDER — ALBUMIN HUMAN 5 % IV SOLN
INTRAVENOUS | Status: DC | PRN
Start: 2018-01-02 — End: 2018-01-02
  Administered 2018-01-02: 09:00:00 via INTRAVENOUS

## 2018-01-02 MED ORDER — CEFAZOLIN SODIUM-DEXTROSE 2-4 GM/100ML-% IV SOLN
INTRAVENOUS | Status: AC
  Filled 2018-01-02: qty 100

## 2018-01-02 MED ORDER — PROPOFOL 10 MG/ML IV BOLUS
INTRAVENOUS | Status: AC
  Filled 2018-01-02: qty 40

## 2018-01-02 MED ORDER — LACTATED RINGERS IV SOLN
INTRAVENOUS | Status: DC | PRN
  Administered 2018-01-02 (×2): via INTRAVENOUS

## 2018-01-02 MED ORDER — FENTANYL CITRATE (PF) 250 MCG/5ML IJ SOLN
INTRAMUSCULAR | Status: AC
Start: 2018-01-02 — End: ?
  Filled 2018-01-02: qty 5

## 2018-01-02 MED ORDER — ACETAMINOPHEN 160 MG/5ML PO SOLN: 325.0000 mg | ORAL | Status: DC | PRN

## 2018-01-02 MED ORDER — DESVENLAFAXINE SUCCINATE ER 25 MG PO TB24: 25.0000 mg | ORAL_TABLET | Freq: Every day | ORAL | Status: DC

## 2018-01-02 MED ORDER — ROCURONIUM BROMIDE 10 MG/ML (PF) SYRINGE
PREFILLED_SYRINGE | INTRAVENOUS | Status: AC
  Filled 2018-01-02: qty 10

## 2018-01-02 MED ORDER — LIDOCAINE 2% (20 MG/ML) 5 ML SYRINGE
INTRAMUSCULAR | Status: AC
  Filled 2018-01-02: qty 5

## 2018-01-02 MED ORDER — INFLUENZA VAC SPLIT QUAD 0.5 ML IM SUSY
0.5000 mL | PREFILLED_SYRINGE | INTRAMUSCULAR | Status: AC
  Administered 2018-01-03: 0.5 mL via INTRAMUSCULAR
  Filled 2018-01-02: qty 0.5

## 2018-01-02 MED ORDER — GABAPENTIN 300 MG PO CAPS
ORAL_CAPSULE | ORAL | Status: AC
  Administered 2018-01-02: 300 mg via ORAL
  Filled 2018-01-02: qty 1

## 2018-01-02 MED ORDER — GENTAMICIN SULFATE 40 MG/ML IJ SOLN
Freq: Once | INTRAMUSCULAR | Status: DC
  Filled 2018-01-02 (×2): qty 1

## 2018-01-02 MED ORDER — ACETAMINOPHEN 500 MG PO TABS
ORAL_TABLET | ORAL | Status: AC
  Administered 2018-01-02: 1000 mg via ORAL
  Filled 2018-01-02: qty 2

## 2018-01-02 MED ORDER — DEXAMETHASONE SODIUM PHOSPHATE 10 MG/ML IJ SOLN
INTRAMUSCULAR | Status: AC
  Filled 2018-01-02: qty 1

## 2018-01-02 MED ORDER — ROCURONIUM BROMIDE 100 MG/10ML IV SOLN
INTRAVENOUS | Status: DC | PRN
  Administered 2018-01-02: 50 mg via INTRAVENOUS
  Administered 2018-01-02 (×2): 20 mg via INTRAVENOUS
  Administered 2018-01-02: 10 mg via INTRAVENOUS

## 2018-01-02 MED ORDER — ENOXAPARIN SODIUM 40 MG/0.4ML ~~LOC~~ SOLN
40.0000 mg | SUBCUTANEOUS | Status: DC
  Administered 2018-01-03: 40 mg via SUBCUTANEOUS
  Filled 2018-01-02 (×2): qty 0.4

## 2018-01-02 MED ORDER — HEPARIN SODIUM (PORCINE) 5000 UNIT/ML IJ SOLN
5000.0000 [IU] | Freq: Once | INTRAMUSCULAR | Status: AC
  Administered 2018-01-02: 5000 [IU] via SUBCUTANEOUS

## 2018-01-02 MED ORDER — MIDAZOLAM HCL 5 MG/5ML IJ SOLN
INTRAMUSCULAR | Status: DC | PRN
  Administered 2018-01-02: 2 mg via INTRAVENOUS

## 2018-01-02 MED ORDER — QUETIAPINE FUMARATE 50 MG PO TABS
25.0000 mg | ORAL_TABLET | Freq: Every day | ORAL | Status: DC
  Administered 2018-01-02: 25 mg via ORAL
  Filled 2018-01-02: qty 1

## 2018-01-02 MED ORDER — KETOROLAC TROMETHAMINE 30 MG/ML IJ SOLN
30.0000 mg | Freq: Three times a day (TID) | INTRAMUSCULAR | Status: AC
  Administered 2018-01-02 – 2018-01-03 (×3): 30 mg via INTRAVENOUS
  Filled 2018-01-02 (×3): qty 1

## 2018-01-02 MED ORDER — LISDEXAMFETAMINE DIMESYLATE 20 MG PO CAPS
60.0000 mg | ORAL_CAPSULE | Freq: Every day | ORAL | Status: DC
  Administered 2018-01-03: 60 mg via ORAL
  Filled 2018-01-02: qty 3

## 2018-01-02 MED ORDER — OXYCODONE HCL 5 MG PO TABS: 5.0000 mg | ORAL_TABLET | Freq: Once | ORAL | Status: DC | PRN

## 2018-01-02 MED ORDER — PROPOFOL 500 MG/50ML IV EMUL
INTRAVENOUS | Status: DC | PRN
  Administered 2018-01-02: 25 ug/kg/min via INTRAVENOUS

## 2018-01-02 MED ORDER — BUPIVACAINE LIPOSOME 1.3 % IJ SUSP
20.0000 mL | Freq: Once | INTRAMUSCULAR | Status: DC
  Filled 2018-01-02 (×2): qty 20

## 2018-01-02 MED ORDER — KCL IN DEXTROSE-NACL 20-5-0.45 MEQ/L-%-% IV SOLN
INTRAVENOUS | Status: DC
  Administered 2018-01-02 – 2018-01-03 (×2): via INTRAVENOUS
  Filled 2018-01-02 (×2): qty 1000

## 2018-01-02 MED ORDER — PHENYLEPHRINE HCL 10 MG/ML IJ SOLN: INTRAVENOUS | Status: DC | PRN

## 2018-01-02 MED ORDER — VENLAFAXINE HCL ER 37.5 MG PO CP24
37.5000 mg | ORAL_CAPSULE | Freq: Every day | ORAL | Status: DC
  Administered 2018-01-02: 37.5 mg via ORAL
  Filled 2018-01-02: qty 1

## 2018-01-02 MED ORDER — CELECOXIB 200 MG PO CAPS
200.0000 mg | ORAL_CAPSULE | ORAL | Status: AC
  Administered 2018-01-02: 200 mg via ORAL

## 2018-01-02 MED ORDER — SUGAMMADEX SODIUM 200 MG/2ML IV SOLN
INTRAVENOUS | Status: DC | PRN
  Administered 2018-01-02: 150 mg via INTRAVENOUS

## 2018-01-02 SURGICAL SUPPLY — 96 items
ADH SKN CLS APL DERMABOND .7 (GAUZE/BANDAGES/DRESSINGS) ×2
APPLIER CLIP 9.375 MED OPEN (MISCELLANEOUS) ×3
APR CLP MED 9.3 20 MLT OPN (MISCELLANEOUS) ×1
BAG DECANTER FOR FLEXI CONT (MISCELLANEOUS) ×3 IMPLANT
BINDER BREAST LRG (GAUZE/BANDAGES/DRESSINGS) ×2 IMPLANT
BINDER BREAST XLRG (GAUZE/BANDAGES/DRESSINGS) IMPLANT
BLADE 10 SAFETY STRL DISP (BLADE) ×3 IMPLANT
BLADE SURG 10 STRL SS (BLADE) ×3 IMPLANT
BLADE SURG 15 STRL LF DISP TIS (BLADE) ×1 IMPLANT
BLADE SURG 15 STRL SS (BLADE) ×3
BNDG COHESIVE 4X5 TAN STRL (GAUZE/BANDAGES/DRESSINGS) ×2 IMPLANT
CANISTER SUCT 3000ML PPV (MISCELLANEOUS) ×3 IMPLANT
CHLORAPREP W/TINT 26ML (MISCELLANEOUS) ×5 IMPLANT
CLIP APPLIE 9.375 MED OPEN (MISCELLANEOUS) ×1 IMPLANT
CLOSURE WOUND 1/2 X4 (GAUZE/BANDAGES/DRESSINGS) ×2
COVER MAYO STAND STRL (DRAPES) ×2 IMPLANT
COVER SURGICAL LIGHT HANDLE (MISCELLANEOUS) ×3 IMPLANT
DERMABOND ADVANCED (GAUZE/BANDAGES/DRESSINGS) ×4
DERMABOND ADVANCED .7 DNX12 (GAUZE/BANDAGES/DRESSINGS) ×2 IMPLANT
DRAIN CHANNEL 15F RND FF W/TCR (WOUND CARE) ×3 IMPLANT
DRAIN CHANNEL 19F RND (DRAIN) ×4 IMPLANT
DRAPE HALF SHEET 40X57 (DRAPES) ×8 IMPLANT
DRAPE INCISE 23X17 IOBAN STRL (DRAPES)
DRAPE INCISE 23X17 STRL (DRAPES) IMPLANT
DRAPE INCISE IOBAN 23X17 STRL (DRAPES) IMPLANT
DRAPE INCISE IOBAN 85X60 (DRAPES) ×1 IMPLANT
DRAPE ORTHO SPLIT 77X108 STRL (DRAPES) ×6
DRAPE SURG ORHT 6 SPLT 77X108 (DRAPES) ×4 IMPLANT
DRAPE WARM FLUID 44X44 (DRAPE) ×3 IMPLANT
DRSG MEPILEX BORDER 4X8 (GAUZE/BANDAGES/DRESSINGS) ×1 IMPLANT
DRSG PAD ABDOMINAL 8X10 ST (GAUZE/BANDAGES/DRESSINGS) ×6 IMPLANT
DRSG TEGADERM 4X4.75 (GAUZE/BANDAGES/DRESSINGS) ×4 IMPLANT
ELECT BLADE 4.0 EZ CLEAN MEGAD (MISCELLANEOUS) ×3
ELECT BLADE 6.5 EXT (BLADE) ×1 IMPLANT
ELECT COATED BLADE 2.86 ST (ELECTRODE) ×5 IMPLANT
ELECT REM PT RETURN 9FT ADLT (ELECTROSURGICAL) ×3
ELECTRODE BLDE 4.0 EZ CLN MEGD (MISCELLANEOUS) ×1 IMPLANT
ELECTRODE REM PT RTRN 9FT ADLT (ELECTROSURGICAL) ×1 IMPLANT
EVACUATOR SILICONE 100CC (DRAIN) ×6 IMPLANT
EXPANDER TISSUE MX 300CC (Breast) IMPLANT
GAUZE SPONGE 4X4 12PLY STRL (GAUZE/BANDAGES/DRESSINGS) ×1 IMPLANT
GAUZE XEROFORM 5X9 LF (GAUZE/BANDAGES/DRESSINGS) ×1 IMPLANT
GLOVE BIO SURGEON STRL SZ 6 (GLOVE) ×9 IMPLANT
GLOVE BIO SURGEON STRL SZ 6.5 (GLOVE) ×2 IMPLANT
GLOVE BIO SURGEONS STRL SZ 6.5 (GLOVE) ×2
GLOVE BIOGEL PI IND STRL 6.5 (GLOVE) IMPLANT
GLOVE BIOGEL PI IND STRL 7.0 (GLOVE) IMPLANT
GLOVE BIOGEL PI INDICATOR 6.5 (GLOVE) ×6
GLOVE BIOGEL PI INDICATOR 7.0 (GLOVE) ×2
GLOVE SURG SS PI 6.0 STRL IVOR (GLOVE) ×3 IMPLANT
GLOVE SURG SS PI 6.5 STRL IVOR (GLOVE) ×2 IMPLANT
GOWN STRL REUS W/ TWL LRG LVL3 (GOWN DISPOSABLE) ×2 IMPLANT
GOWN STRL REUS W/TWL LRG LVL3 (GOWN DISPOSABLE) ×15
KIT BASIN OR (CUSTOM PROCEDURE TRAY) ×3 IMPLANT
KIT ROOM TURNOVER OR (KITS) ×3 IMPLANT
NEEDLE 22X1 1/2 (OR ONLY) (NEEDLE) ×3 IMPLANT
NS IRRIG 1000ML POUR BTL (IV SOLUTION) ×6 IMPLANT
PACK GENERAL/GYN (CUSTOM PROCEDURE TRAY) ×3 IMPLANT
PAD ABD 8X10 STRL (GAUZE/BANDAGES/DRESSINGS) ×2 IMPLANT
PAD ARMBOARD 7.5X6 YLW CONV (MISCELLANEOUS) ×9 IMPLANT
PEN SKIN MARKING BROAD (MISCELLANEOUS) ×3 IMPLANT
PENCIL BUTTON HOLSTER BLD 10FT (ELECTRODE) IMPLANT
PIN SAFETY STERILE (MISCELLANEOUS) ×3 IMPLANT
SET ASEPTIC TRANSFER (MISCELLANEOUS) ×2 IMPLANT
SET COLLECT BLD 21X3/4 12 PB (MISCELLANEOUS) IMPLANT
SOL PREP POV-IOD 4OZ 10% (MISCELLANEOUS) ×2 IMPLANT
SPONGE GAUZE 4X4 12PLY STER LF (GAUZE/BANDAGES/DRESSINGS) ×2 IMPLANT
SPONGE LAP 18X18 X RAY DECT (DISPOSABLE) ×4 IMPLANT
STAPLER VISISTAT 35W (STAPLE) ×3 IMPLANT
STOCKINETTE IMPERVIOUS 9X36 MD (GAUZE/BANDAGES/DRESSINGS) ×2 IMPLANT
STRIP CLOSURE SKIN 1/2X4 (GAUZE/BANDAGES/DRESSINGS) ×4 IMPLANT
SUT CHROMIC 4 0 PS 2 18 (SUTURE) IMPLANT
SUT ETHILON 2 0 FS 18 (SUTURE) ×6 IMPLANT
SUT MNCRL AB 4-0 PS2 18 (SUTURE) ×8 IMPLANT
SUT PDS AB 2-0 CT1 27 (SUTURE) ×2 IMPLANT
SUT PDS AB 2-0 CT2 27 (SUTURE) ×10 IMPLANT
SUT VIC AB 3-0 PS2 18 (SUTURE)
SUT VIC AB 3-0 PS2 18XBRD (SUTURE) ×4 IMPLANT
SUT VIC AB 3-0 SH 27 (SUTURE) ×9
SUT VIC AB 3-0 SH 27X BRD (SUTURE) IMPLANT
SUT VIC AB 3-0 SH 8-18 (SUTURE) ×3 IMPLANT
SUT VIC AB 4-0 PS2 18 (SUTURE) ×4 IMPLANT
SUT VIC AB 4-0 PS2 27 (SUTURE) ×3 IMPLANT
SUT VICRYL 4-0 PS2 18IN ABS (SUTURE) IMPLANT
SUT VLOC 180 0 24IN GS25 (SUTURE) ×4 IMPLANT
SYR 50ML SLIP (SYRINGE) IMPLANT
SYR BULB IRRIGATION 50ML (SYRINGE) ×3 IMPLANT
SYR CONTROL 10ML LL (SYRINGE) ×3 IMPLANT
TISSUE EXPANDER MX 300CC (Breast) ×3 IMPLANT
TOWEL OR 17X24 6PK STRL BLUE (TOWEL DISPOSABLE) ×3 IMPLANT
TOWEL OR 17X26 10 PK STRL BLUE (TOWEL DISPOSABLE) ×3 IMPLANT
TRAY FOLEY W/METER SILVER 14FR (SET/KITS/TRAYS/PACK) ×1 IMPLANT
TRAY FOLEY W/METER SILVER 16FR (SET/KITS/TRAYS/PACK) ×2 IMPLANT
TUBE CONNECTING 12'X1/4 (SUCTIONS) ×2
TUBE CONNECTING 12X1/4 (SUCTIONS) ×3 IMPLANT
YANKAUER SUCT BULB TIP NO VENT (SUCTIONS) ×2 IMPLANT

## 2018-01-02 NOTE — Interval H&P Note (Signed)
History and Physical Interval Note:  01/02/2018 6:54 AM  [name]  has presented today for surgery, with the diagnosis of ACQUIRED ABSCENCE OF BREAST  The various methods of treatment have been discussed with the patient and family. After consideration of risks, benefits and other options for treatment, the patient has consented to  Procedure(s): LATISSIMUS FLAP TO BREAST (Left) TISSUE EXPANDER (Left) as a surgical intervention .  The patient's history has been reviewed, patient examined, no change in status, stable for surgery.  I have reviewed the patient's chart and labs.  Questions were answered to the patient's satisfaction.     Elfida Shimada

## 2018-01-02 NOTE — Anesthesia Procedure Notes (Signed)
Procedure Name: Intubation Date/Time: 01/02/2018 7:40 AM Performed by: Scheryl Darter, CRNA Pre-anesthesia Checklist: Patient identified, Emergency Drugs available, Suction available and Patient being monitored Patient Re-evaluated:Patient Re-evaluated prior to induction Oxygen Delivery Method: Circle System Utilized Preoxygenation: Pre-oxygenation with 100% oxygen Induction Type: IV induction Ventilation: Mask ventilation without difficulty Laryngoscope Size: Miller and 2 Grade View: Grade II Tube type: Oral Tube size: 7.0 mm Number of attempts: 1 Airway Equipment and Method: Stylet Placement Confirmation: ETT inserted through vocal cords under direct vision,  positive ETCO2 and breath sounds checked- equal and bilateral Secured at: 22 cm Tube secured with: Tape Dental Injury: Teeth and Oropharynx as per pre-operative assessment

## 2018-01-02 NOTE — Anesthesia Preprocedure Evaluation (Signed)
Anesthesia Evaluation  Patient identified by MRN, date of birth, ID band Patient awake    Reviewed: Allergy & Precautions, H&P , NPO status , Patient's Chart, lab work & pertinent test results  History of Anesthesia Complications (+) PONV and history of anesthetic complications  Airway Mallampati: II  TM Distance: >3 FB Neck ROM: full    Dental  (+) Teeth Intact   Pulmonary shortness of breath, asthma , sleep apnea , Current Smoker,    breath sounds clear to auscultation       Cardiovascular negative cardio ROS   Rhythm:regular Rate:Normal     Neuro/Psych  Headaches, PSYCHIATRIC DISORDERS Anxiety Depression    GI/Hepatic negative GI ROS, Neg liver ROS,   Endo/Other  negative endocrine ROS  Renal/GU negative Renal ROS     Musculoskeletal  (+) Arthritis ,   Abdominal   Peds  Hematology negative hematology ROS (+)   Anesthesia Other Findings   Reproductive/Obstetrics                             Anesthesia Physical Anesthesia Plan  ASA: II  Anesthesia Plan: General   Post-op Pain Management:    Induction: Intravenous  PONV Risk Score and Plan: 3 and Ondansetron, Dexamethasone and Propofol infusion  Airway Management Planned: Oral ETT  Additional Equipment: None  Intra-op Plan:   Post-operative Plan: Extubation in OR  Informed Consent: I have reviewed the patients History and Physical, chart, labs and discussed the procedure including the risks, benefits and alternatives for the proposed anesthesia with the patient or authorized representative who has indicated his/her understanding and acceptance.   Dental advisory given  Plan Discussed with: CRNA and Surgeon  Anesthesia Plan Comments:         Anesthesia Quick Evaluation

## 2018-01-02 NOTE — Op Note (Signed)
Operative Note   DATE OF OPERATION: 2.22.2019  LOCATION: [facility]  SURGICAL DIVISION: Plastic Surgery  PREOPERATIVE DIAGNOSES:  1. Acquired absence breasts  POSTOPERATIVE DIAGNOSES:  same  PROCEDURE:  1. Left breast reconstruction with tissue expander 2. Left latissimus dorsi flap  SURGEON: Irene Limbo MD MBA  ASSISTANT: S. Rayburn PA-C  ANESTHESIA:  General.   EBL: 301 ml  COMPLICATIONS: None immediate.   INDICATIONS FOR PROCEDURE:  The patient, [name], is a 42 y.o. female born on 10-12-1976, is here for left breast reconstruction. 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 She underwent immediate expander reconstruction. She developed full thickness ulceration mastectomy flap following revision reconstruction with gross exposure of implant leading to explantation.   FINDINGS: Natrelle 133MX-11-T 300 ml tissue expander placed, initial fill volume 60 ml SN [id number]  DESCRIPTION OF PROCEDURE:  The patient's operative site was marked with the patient in the preoperative area including sternal notch, chest midline, anterior axillary line, breast meridians.The patient was taken to the operating room. SCDs were placed and IV antibiotics were given. Foley catheter placed. Patient was placed right lateral position and prepped and draped. A time out was performed and all information was confirmed to be correct. Incision made through priorright mid inframammary foldscar.Skin flaps elevated off underlying pectoralis muscle. The pectoralis was sutured to chest wall with 0 V-lock suture and prior sub pectoral pocket closed.Incision made surrounding skin paddle designed over left back. Skin and superficial fascia elevated off surface of latissimus muscle and subcutaneous tunnel dissected to axilla joining anterior breast cavity. Additional incision made within axilla to aid with dissection and transfer flap. Anterior border of  latissimus identified and elevated. Muscle divided inferiorly at superior iliac spine. Submuscular dissection completed toward midline back and toward origin. Thoracodorsal nerve wasidentified anddivided. Flap rotated into anterior chest cavity. Back irrigated and hemostasis ensured. Exparel infiltrated total 266 mg forchest and back. 15Fr drain placed and secured with 2-0 nylon. 2-0 PDS used to placed quilting sutures from elevated skin flaps to chest wall. Incision closed with 0 V lock suture in superficial fascia and dermis. Skin closure completed with 4-0 monocryl subcuticular.   Axillary incision closed with 3-0 vicryl in dermis and superficial fascia. Skin closure completed with 4-0 monocryl subcuticular.   The flap muscle was redraped withinrightchest.The skin paddle brought with latissimus was removed.The muscle was secured to pectoralis and serratus muscle and inferiorly to abdominal wall fascia with interrupted 2-0 PDS. 74 Fr JP drain placed in breast cavity and secured to chest with 2-0 nylon. Cavity irrigated with solution containingAncef, gentamicin,and bacitracin,hemostasis obtained.Cavitiy then irrigated with Betadine. Expander prepared and placed in left chest cavity.  Expander secured to chest wall with 3-0 vicryl. Remainder oflatissimus muscle sutured to chest wall withPDS sutures.Closure incision completed with 3-0 vicryl in superficial fascia, 4-0 vicryl in dermis, 4-0 monocryl for skin closure.A perforation incurred in prior scar line of explant surgery was primarily closed with 4-0 vicryl in dermis and 4-0 monocryl skin closure.  Tissue adhesive was applied to breast and back incisions. Tegaderm dressing applied to chest. Dry dressing and breast binder, abdominal binderapplied  The patient was allowed to wake from anesthesia, extubated and taken to the recovery room in satisfactory condition.   SPECIMENS: none  DRAINS: 15 Fr JP in back, 19 fr JP in left  chest  Irene Limbo, MD [facility] Plastic & Reconstructive Surgery [phone], pin [phone]

## 2018-01-02 NOTE — Transfer of Care (Signed)
Immediate Anesthesia Transfer of Care Note  Patient: [name]  Procedure(s) Performed: LATISSIMUS FLAP TO BREAST (Left ) TISSUE EXPANDER (Left )  Patient Location: PACU  Anesthesia Type:General  Level of Consciousness: awake, alert , oriented and sedated  Airway & Oxygen Therapy: Patient Spontanous Breathing and Patient connected to face mask oxygen  Post-op Assessment: Report given to RN, Post -op Vital signs reviewed and stable and Patient moving all extremities  Post vital signs: Reviewed and stable  Last Vitals:  Vitals:   01/02/18 0608  BP: 119/79  Pulse: 81  Resp: 20  Temp: 36.5 C  SpO2: 100%    Last Pain:  Vitals:   01/02/18 0608  TempSrc: Oral      Patients Stated Pain Goal: 3 (80/99/83 3825)  Complications: No apparent anesthesia complications

## 2018-01-03 DIAGNOSIS — N650 Deformity of reconstructed breast: Secondary | ICD-10-CM | POA: Diagnosis not present

## 2018-01-03 MED ORDER — OXYCODONE-ACETAMINOPHEN 5-325 MG PO TABS: 1.0000 | ORAL_TABLET | ORAL | 0 refills | Status: DC | PRN

## 2018-01-03 MED ORDER — SULFAMETHOXAZOLE-TRIMETHOPRIM 800-160 MG PO TABS: 1.0000 | ORAL_TABLET | Freq: Two times a day (BID) | ORAL | 0 refills | Status: DC

## 2018-01-03 MED ORDER — METHOCARBAMOL 500 MG PO TABS: 500.0000 mg | ORAL_TABLET | Freq: Three times a day (TID) | ORAL | 0 refills | Status: DC | PRN

## 2018-01-03 NOTE — Discharge Summary (Signed)
Physician Discharge Summary  Patient ID: [name] MRN: [id number] DOB/AGE: May 04, 1976 42 y.o.  Admit date: 01/02/2018 Discharge date: 01/03/2018  Admission Diagnoses: Acquired absence breasts  Discharge Diagnoses:  same  Discharged Condition: stable  Hospital Course: Postoperatively patient tolerating diet, required no IV pain medication with exception scheduled Toradal, and was ambulatory in room with minimal assist on POD#1. She will discharge late POD#1 if able to ambulate more and continue to tolerate oral medication only. Reviewed drain and wound care.  Treatments: surgery: left breast reconstruction with tissue expander latissimus dorsi flap  Discharge Exam: Blood pressure (!) 92/54, pulse 97, temperature 98.3 F (36.8 C), temperature source Oral, resp. rate 18, height 5\' 6"  (1.676 m), weight 64.4 kg (141 lb 15.6 oz), last menstrual period 01/07/2013, SpO2 100 %. Incision/Wound: chest soft Tegaderms in place no drainage back flat drains serosanguinous  Disposition: 01-Home or Self Care  Discharge Instructions    Call MD for:  redness, tenderness, or signs of infection (pain, swelling, bleeding, redness, odor or green/yellow discharge around incision site)   Complete by:  As directed    Call MD for:  temperature >100.5   Complete by:  As directed    Discharge instructions   Complete by:  As directed    Ok to remove dressings and shower am 2.24.19. Soap and water ok, pat Tegaderms dry. Do not remove Tegaderms. No creams or ointments over incisions. Do not let drains dangle in shower, attach to lanyard or similar.Strip and record drains twice daily and bring log to clinic visit.  Breast binder or soft compression bra all other times.  Ok to raise arms above shoulders for bathing and dressing.  No house yard work or exercise until cleared by MD.   Driving Restrictions   Complete by:  As directed    No driving for 2 weeks, then no driving if taking narcotics   Lifting  restrictions   Complete by:  As directed    No lifting > 5 lbs until cleared by MD   Resume previous diet   Complete by:  As directed      Allergies as of 01/03/2018      Reactions   Shrimp [shellfish Allergy] Shortness Of Breath, Swelling   Kiwi Extract Hives      Medication List    TAKE these medications   aspirin EC 81 MG tablet Take 243 mg by mouth daily.   diphenhydrAMINE 25 MG tablet Commonly known as:  BENADRYL Take 50 mg by mouth daily as needed.   doxylamine (Sleep) 25 MG tablet Commonly known as:  UNISOM Take 50 mg by mouth at bedtime as needed for sleep.   ferrous sulfate 325 (65 FE) MG EC tablet Take 325 mg by mouth 3 (three) times daily with meals.   lisdexamfetamine 60 MG capsule Commonly known as:  VYVANSE Take 60 mg by mouth every morning.   methocarbamol 500 MG tablet Commonly known as:  ROBAXIN Take 1 tablet (500 mg total) by mouth every 8 (eight) hours as needed for muscle spasms.   ondansetron 4 MG tablet Commonly known as:  ZOFRAN Take 4 mg by mouth every 8 (eight) hours as needed for nausea or vomiting.   oxyCODONE-acetaminophen 5-325 MG tablet Commonly known as:  PERCOCET/ROXICET Take 1-2 tablets by mouth every 4 (four) hours as needed for moderate pain.   PRISTIQ 25 MG Tb24 Generic drug:  Desvenlafaxine Succinate ER Take 25 mg by mouth at bedtime.   QUEtiapine 25 MG tablet  Commonly known as:  SEROQUEL Take 25 mg by mouth at bedtime.   sulfamethoxazole-trimethoprim 800-160 MG tablet Commonly known as:  BACTRIM DS,SEPTRA DS Take 1 tablet by mouth 2 (two) times daily.   traZODone 100 MG tablet Commonly known as:  DESYREL Take 100 mg by mouth at bedtime.   VITAMIN C PO Take 1 tablet by mouth 3 (three) times daily.      Follow-up Information    Irene Limbo, MD In 1 week.   Specialty:  Plastic Surgery Why:  as scheduled Contact information: [address] [place] [address] [phone]            Signed: Irene Limbo 01/03/2018, 10:05 AM

## 2018-01-05 ENCOUNTER — Encounter (HOSPITAL_COMMUNITY): Payer: Self-pay | Admitting: Plastic Surgery

## 2018-01-05 NOTE — Anesthesia Postprocedure Evaluation (Signed)
Anesthesia Post Note  Patient: [name]  Procedure(s) Performed: LATISSIMUS FLAP TO BREAST (Left ) TISSUE EXPANDER (Left )     Patient location during evaluation: PACU Anesthesia Type: General Level of consciousness: awake and alert Pain management: pain level controlled Vital Signs Assessment: post-procedure vital signs reviewed and stable Respiratory status: spontaneous breathing, nonlabored ventilation, respiratory function stable and patient connected to nasal cannula oxygen Cardiovascular status: blood pressure returned to baseline and stable Postop Assessment: no apparent nausea or vomiting Anesthetic complications: no    Last Vitals:  Vitals:   01/03/18 1400 01/03/18 1516  BP: 114/76   Pulse: (!) 129 89  Resp: 18 16  Temp: 37 C   SpO2: 100% 100%    Last Pain:  Vitals:   01/03/18 1400  TempSrc: Oral  PainSc:                  [name]

## 2018-02-04 ENCOUNTER — Ambulatory Visit: Payer: Medicare Other | Attending: Plastic Surgery | Admitting: Physical Therapy

## 2018-02-04 ENCOUNTER — Other Ambulatory Visit: Payer: Self-pay

## 2018-02-04 DIAGNOSIS — M25612 Stiffness of left shoulder, not elsewhere classified: Secondary | ICD-10-CM | POA: Insufficient documentation

## 2018-02-04 DIAGNOSIS — Z483 Aftercare following surgery for neoplasm: Secondary | ICD-10-CM | POA: Insufficient documentation

## 2018-02-04 DIAGNOSIS — M25512 Pain in left shoulder: Secondary | ICD-10-CM

## 2018-02-04 NOTE — Therapy (Signed)
[place], [place], [address] Phone: [phone]   Fax:  [phone]  Physical Therapy Evaluation  Patient Details  Name: [name] MRN: [id number] Date of Birth: 08-26-1976 Referring Provider: Dr. Irene Limbo   Encounter Date: 02/04/2018  PT End of Session - 02/04/18 1429    Visit Number  1    Number of Visits  17    Date for PT Re-Evaluation  04/06/18    PT Start Time  4287    PT Stop Time  1428    PT Time Calculation (min)  43 min    Activity Tolerance  Patient tolerated treatment well;Patient limited by pain    Behavior During Therapy  [facility] for tasks assessed/performed       Past Medical History:  Diagnosis Date  . Anemia    no current med.  . Anxiety   . Arthritis    left ankle (01/02/2018)  . Bilateral breast cancer ([place])    abnormal cells to both breast   . Chronic bronchitis ([place])   . Complication of anesthesia    hx. of waking up during surgery  . Depression   . Migraines    01/02/2018 "they pop up out of the blue; no pattern"  . PONV (postoperative nausea and vomiting)   . Seasonal asthma    no current med.  . Sleep apnea    mild per pt. - no CPAP use    Past Surgical History:  Procedure Laterality Date  . ABDOMINAL HYSTERECTOMY    . ANKLE FUSION Left 10/19/2014   Procedure: Tibiocalcaneal Fusion;  Surgeon: Newt Minion, MD;  Location: [place];  Service: Orthopedics;  Laterality: Left;  . BILATERAL SALPINGECTOMY N/A 01/21/2013   Procedure: BILATERAL SALPINGECTOMY;  Surgeon: Thornell Sartorius, MD;  Location: [facility];  Service: Gynecology;  Laterality: N/A;  . BREAST BIOPSY Right   . BREAST IMPLANT EXCHANGE Bilateral 04/29/2017   Procedure: REVISION OF BILATERAL RECONSTRUCTION WITH SILICONE IMPLANT EXCHANGE AND ALLODERM BILATERAL BREAST;  Surgeon: Irene Limbo, MD;  Location: [place];  Service: Plastics;  Laterality: Bilateral;  . BREAST IMPLANT REMOVAL  Left 07/16/2017   Procedure: REMOVAL BREAST IMPLANTS WITH DEBRIDEMENT OF BREAST FLAP;  Surgeon: Irene Limbo, MD;  Location: [place];  Service: Plastics;  Laterality: Left;  . BREAST RECONSTRUCTION WITH PLACEMENT OF TISSUE EXPANDER AND FLEX HD (ACELLULAR HYDRATED DERMIS) Bilateral 10/29/2016   Procedure: BILATERAL BREAST RECONSTRUCTION WITH PLACEMENT OF TISSUE EXPANDER AND  CORTIVA;  Surgeon: Irene Limbo, MD;  Location: [place];  Service: Plastics;  Laterality: Bilateral;  . EXTERNAL FIXATION LEG Left 10/14/2014   Procedure: EXTERNAL FIXATION LEFT ANKLE;  Surgeon: Marianna Payment, MD;  Location: [place];  Service: Orthopedics;  Laterality: Left;  . EXTERNAL FIXATION REMOVAL Left 10/19/2014   Procedure: REMOVAL EXTERNAL FIXATION LEG;  Surgeon: Newt Minion, MD;  Location: [place] [place];  Service: Orthopedics;  Laterality: Left;  . FRACTURE SURGERY    . I&D EXTREMITY Left 10/14/2014   Procedure: IRRIGATION AND DEBRIDEMENT OPEN TIBIA FRACTURE LEFT LEG;  Surgeon: Marianna Payment, MD;  Location: [place];  Service: Orthopedics;  Laterality: Left;  . LAPAROSCOPIC ASSISTED VAGINAL HYSTERECTOMY N/A 01/21/2013   Procedure: LAPAROSCOPIC ASSISTED VAGINAL HYSTERECTOMY;  Surgeon: Thornell Sartorius, MD;  Location: [facility];  Service: Gynecology;  Laterality: N/A;  . LATISSIMUS FLAP TO BREAST Left 01/02/2018   Procedure: LATISSIMUS FLAP TO BREAST;  Surgeon: Irene Limbo, MD;  Location: [facility];  Service:  Plastics;  Laterality: Left;  Marland Kitchen MASTECTOMY    . NIPPLE SPARING MASTECTOMY Bilateral 10/29/2016   Procedure: BILATERAL NIPPLE SPARING MASTECTOMIES;  Surgeon: Donnie Mesa, MD;  Location: [facility];  Service: General;  Laterality: Bilateral;  . NOVASURE ABLATION  03/13/2007  . RECONSTRUCTION BREAST IMMEDIATE / DELAYED W/ TISSUE EXPANDER Left 01/02/2018   LATISSIMUS FLAP TO BREAST;  TISSUE EXPANDER   . REMOVAL OF BILATERAL TISSUE EXPANDERS WITH PLACEMENT OF BILATERAL BREAST IMPLANTS Bilateral 01/21/2017   Procedure: REMOVAL OF  BILATERAL TISSUE EXPANDERS WITH PLACEMENT OF BILATERAL BREAST IMPLANTS;  Surgeon: Irene Limbo, MD;  Location: [place];  Service: Plastics;  Laterality: Bilateral;  . THYROID LOBECTOMY Left 10/01/2013   Procedure: THYROID LOBECTOMY;  Surgeon: Madilyn Hook, DO;  Location: [facility];  Service: General;  Laterality: Left;  . TISSUE EXPANDER PLACEMENT Left 01/02/2018   Procedure: TISSUE EXPANDER;  Surgeon: Irene Limbo, MD;  Location: [facility] [facility];  Service: Plastics;  Laterality: Left;  . TONSILLECTOMY    . TUBAL LIGATION      There were no vitals filed for this visit.   Subjective Assessment - 02/04/18 1347    Subjective  I recently had surgery on 01/02/18 and the doctor took some muscle from my back and brought it around. They put an expander in on the left side.     Pertinent History  Diagnosed with left breast cancer 2016 or 2017. Early stage cancer so no other treatment; no lymph nodes removed. Plan was to do a double mastectomy and have implants in both; implants got infected. Got a second infection where the implant ripped through the skin.  On the right she had the expander and implant, and that side's okay. Seasonal allergies and mild asthma. Had ankle fusion in August 2018 for an old injury.    Diagnostic tests  Hopefully I can get some type of movement back in my arm where I can lift it all the way up.     Currently in Pain?  Yes    Pain Score  9     Pain Location  Arm    Pain Orientation  Left    Pain Descriptors / Indicators  Tingling    Aggravating Factors   arm underneath a pillow or sit too long    Pain Relieving Factors  change positions         [facility] PT Assessment - 02/04/18 0001      Assessment   Medical Diagnosis  bilateral breast cancer with bilateral mastectomies; recent left lat flap procedure with implant placed    Referring Provider  Dr. Irene Limbo    Onset Date/Surgical Date  01/02/18    Hand Dominance  Left    Prior Therapy  none       Precautions   Precautions  Other (comment)    Precaution Comments  cancer precautions      Restrictions   Weight Bearing Restrictions  No    Other Position/Activity Restrictions  Pt. says she has no restrictions from her plastic surgeon      Balance Screen   Has the patient fallen in the past 6 months  Yes    How many times?  1 was moving and fell back, bruised right shoulder    Has the patient had a decrease in activity level because of a fear of falling?   Yes says she's clumsy; doesn't feel it needs to be addressed    Is the patient reluctant to leave their home because  of a fear of falling?   No      Home Environment   Living Environment  Private residence    Living Arrangements  Children 68 year-old daughter    Type of Home  Apartment condo    Home Layout  Two level    Alternate Level Stairs-Rails  -- one side      Prior Function   Level of Independence  Independent    Vocation  On disability    Leisure  walking: 2 miles most days      Cognition   Overall Cognitive Status  Within Functional Limits for tasks assessed      Observation/Other Assessments   Observations  left upper back incision approx. 3 inches long well healed; appears to have a string of glue above it    Skin Integrity  anterior incisions not viewed today; pt. reports all lis well with these    Quick DASH   54.55      ROM / Strength   AROM / PROM / Strength  AROM;PROM      AROM   AROM Assessment Site  Shoulder    Right/Left Shoulder  Right;Left    Right Shoulder Flexion  158 Degrees sitting    Right Shoulder ABduction  170 Degrees    Right Shoulder Internal Rotation  71 Degrees supine    Right Shoulder External Rotation  76 Degrees    Left Shoulder Flexion  88 Degrees cording visible in left elbow and left axilla    Left Shoulder ABduction  76 Degrees c/o pulling    Left Shoulder Internal Rotation  62 Degrees    Left Shoulder External Rotation  67 Degrees      PROM   PROM Assessment Site  Shoulder     Right/Left Shoulder  Left    Left Shoulder Flexion  110 Degrees    Left Shoulder ABduction  77 Degrees VERY prominent cording visible with this motion      Ambulation/Gait   Ambulation/Gait  Yes    Ambulation/Gait Assistance  6: Modified independent (Device/Increase time)    Gait Pattern  -- abnormal due to ankle fusion    Gait Comments  says she doesn't use the cane she has but has to take her time and is slow on stairs due to ankle fusion             Quick Dash - 02/04/18 0001    Open a tight or new jar  Mild difficulty    Do heavy household chores (wash walls, wash floors)  Moderate difficulty    Carry a shopping bag or briefcase  Severe difficulty    Wash your back  Unable    Use a knife to cut food  No difficulty    Recreational activities in which you take some force or impact through your arm, shoulder, or hand (golf, hammering, tennis)  Mild difficulty    During the past week, to what extent has your arm, shoulder or hand problem interfered with your normal social activities with family, friends, neighbors, or groups?  Quite a bit    During the past week, to what extent has your arm, shoulder or hand problem limited your work or other regular daily activities  Quite a bit    Arm, shoulder, or hand pain.  Moderate    Tingling (pins and needles) in your arm, shoulder, or hand  Moderate    Difficulty Sleeping  Severe difficulty    DASH Score  54.55 %  No data recorded  Objective measurements completed on examination: See above findings.              PT Education - 02/04/18 1428    Education provided  Yes    Education Details  about cording and that if cords pop that is okay; dowel exercise for left shoulder flexion and abduction, for bilateral shoulder extension; doorway stretch    Person(s) Educated  Patient    Methods  Explanation;Demonstration;Verbal cues;Handout    Comprehension  Verbalized understanding;Returned demonstration       PT Short  Term Goals - 02/04/18 2014      PT SHORT TERM GOAL #1   Title  Left shoulder active flexion to at least 120 degrees for improved overhead reach.    Baseline  At eval, left is 88 compared to 158 on right.    Time  4    Period  Weeks    Status  New      PT SHORT TERM GOAL #2   Title  Left shoulder active abduction to at least 110 degrees    Baseline  At eval, 76 degrees on left compared to 170 on right    Time  4    Period  Weeks    Status  New      PT SHORT TERM GOAL #3   Title  left shoulder active external rotation to at least 75 degrees, right to 80 degrees    Baseline  At eval, left is 67 compared to 76 on right    Time  4    Period  Weeks    Status  New      PT SHORT TERM GOAL #4   Title  Pt. will report at least 25% decrease in discomfort in her left arm    Time  4    Period  Weeks    Status  New        PT Long Term Goals - 02/04/18 2018      PT LONG TERM GOAL #1   Title  Left shoulder active flexion to at least 140 degrees for improved overhead reach    Baseline  88 at eval on left compared to 158 on right    Time  8    Period  Weeks    Status  New      PT LONG TERM GOAL #2   Title  Left shoulder active abduction to at least 140 degrees for improved ADLs    Baseline  At eval, 76 on left compared to 170 on right    Time  8    Period  Weeks    Status  New      PT LONG TERM GOAL #3   Title  left shoulder active external rotation to 80 degrees    Baseline  at eval, 67 on left compared to 76 on right    Time  8    Period  Weeks    Status  New      PT LONG TERM GOAL #4   Title  Pt. will report at least 60% decrease in left arm pain    Time  8    Period  Weeks    Status  New      PT LONG TERM GOAL #5   Title  Quick DASH score reduced to 20 or less    Baseline  54.55 at eval    Time  8    Period  Weeks  Status  New             Plan - 02/04/18 2006    Clinical Impression Statement  Pt. is a 42 year-old woman s/p bilateral mastectomies for  breast cancer. She had some complications with reconstruction and has recently had a left lat flap procedure with implant. She reports she had no lymph nodes removed and had no chemo nor radiation. She has significantly limited left shoulder AROM and PROM which appears to be due to very significant cording at left axilla and extending down past her elbow. This is likely to be the cause of discomfort that she feels in the left arm as well. She has not learned any stretches to date.    History and Personal Factors relevant to plan of care:  This involves her left arm and she is left handed.    Clinical Presentation  Stable    Clinical Decision Making  Low    Rehab Potential  Good    PT Frequency  2x / week    PT Duration  8 weeks    PT Treatment/Interventions  ADLs/Self Care Home Management;Moist Heat;Therapeutic exercise;Patient/family education;Manual techniques;Manual lymph drainage;Scar mobilization;Passive range of motion    PT Next Visit Plan  Review HEP given today.  Begin P/AA/AROM, soft tissue mobilization and myofascial release for cording at left axilla and down left arm.    PT Home Exercise Plan  standing dowel stretches for left shoulder flexion and abduction; doorway stretch    Consulted and Agree with Plan of Care  Patient       Patient will benefit from skilled therapeutic intervention in order to improve the following deficits and impairments:  Hypermobility, Decreased range of motion, Increased fascial restricitons, Impaired UE functional use, Pain  Visit Diagnosis: Stiffness of left shoulder, not elsewhere classified - Plan: PT plan of care cert/re-cert  Left shoulder pain, unspecified chronicity - Plan: PT plan of care cert/re-cert  Aftercare following surgery for neoplasm - Plan: PT plan of care cert/re-cert     Problem List Patient Active Problem List   Diagnosis Date Noted  . Acquired absence of breast and nipple 01/21/2017  . Fibrocystic breast changes, bilateral  10/29/2016  . Fibrocystic breast disease (FCBD) in female 04/17/2016  . Talus fracture 10/14/2014  . Preoperative clearance 04/12/2014  . Wound, surgical, infected 10/18/2013  . S/P laparoscopic assisted vaginal hysterectomy (LAVH) 01/21/2013  . Menorrhagia with regular cycle 01/20/2013  . S/P endometrial ablation 01/20/2013  . OBSTRUCTIVE SLEEP APNEA 07/20/2009  . ALLERGIC RHINITIS 07/20/2009  . ASTHMA 07/20/2009  . DYSPNEA 07/20/2009    [name] 02/04/2018, 8:26 PM  [place], [place], [address] Phone: [phone]   Fax:  [phone]  Name: [name] MRN: [id number] Date of Birth: 1976/06/29  Serafina Royals, PT 02/04/18 8:26 PM

## 2018-02-04 NOTE — Patient Instructions (Addendum)
Axillary web syndrome (also called cording) can happen after having breast cancer surgery when lymph nodes in the armpit are removed. It presents as if you have a thin cord in your arm and can run from the armpit all the way down into the forearm. If you've had a sentinel node biopsy, the risk is 1-20% and if you've had an axillary lymph node dissection (more than 7 nodes removed), the risk is 36-72%. The ranges vary depending on the research study.  It most often happens 3-4 weeks post-op but can happen sooner or later. There are several possibilities for what cording actually is. Although no one knows for sure as of yet, it may be related to lymphatics, veins, or other tissue. Sometimes cording resolves on its own but other times it requires physical therapy with a therapist who specializes in lymphedema and/or cancer rehab. Treatment typically involves stretching, manual techniques, and exercise. Sometimes cords get "released" while stretching or during manual treatment and the patient may experience the sensation of a "pop." This may feel strange but it is not dangerous and is a sign that the cord has released; range of motion may be improved in the process.   Flexion (Eccentric) - Active-Assist (Cane)          Cancer Rehab [phone]    Use unaffected arm to push affected arm forward. Avoid hiking shoulder (shoulder should NOT touch cheek). Keep palm relaxed. Slowly lower affected arm. Hold stretch for _5_ seconds repeating _5-10_ times, _1-2_ times a day.  Abduction (Eccentric) - Active-Assist (Cane)    Use unaffected arm to push affected arm out to side. Avoid hiking shoulder (shoulder should NOT touch cheek). Keep palm relaxed. Slowly lower affected arm. Hold stretch _5_ seconds repeating _5-10_ times, _1-2_ times a day.  Cane Exercise: Extension   Stand holding cane behind back with both hands palm-up. Lift the cane away from body until gentle stretch felt. Do NOT lean forward.  Hold  __5__ seconds. Repeat _5-10___ times. Do _1-2___ sessions per day.  CHEST: Doorway, Bilateral - Standing    Standing in doorway, place hands on wall with elbows bent at shoulder height and place one foot in front of other. Shift weight onto front foot. Hold _10-20__ seconds. Do _3-5_ times, _1-2_ times a day.

## 2018-02-11 ENCOUNTER — Ambulatory Visit: Payer: Medicare Other | Attending: Plastic Surgery | Admitting: Physical Therapy

## 2018-02-11 DIAGNOSIS — Z483 Aftercare following surgery for neoplasm: Secondary | ICD-10-CM | POA: Diagnosis not present

## 2018-02-11 DIAGNOSIS — M25512 Pain in left shoulder: Secondary | ICD-10-CM | POA: Diagnosis not present

## 2018-02-11 DIAGNOSIS — M25612 Stiffness of left shoulder, not elsewhere classified: Secondary | ICD-10-CM | POA: Insufficient documentation

## 2018-02-11 NOTE — Therapy (Signed)
[place], [place], [address] Phone: [phone]   Fax:  [phone]  Physical Therapy Treatment  Patient Details  Name: [name] MRN: [id number] Date of Birth: 10/04/1976 Referring Provider: Dr. Irene Limbo   Encounter Date: 02/11/2018  PT End of Session - 02/11/18 1201    Visit Number  2    Number of Visits  17    Date for PT Re-Evaluation  04/06/18    PT Start Time  1100    PT Stop Time  1145    PT Time Calculation (min)  45 min    Activity Tolerance  Patient tolerated treatment well    Behavior During Therapy  [facility] for tasks assessed/performed       Past Medical History:  Diagnosis Date  . Anemia    no current med.  . Anxiety   . Arthritis    left ankle (01/02/2018)  . Bilateral breast cancer ([place])    abnormal cells to both breast   . Chronic bronchitis ([place])   . Complication of anesthesia    hx. of waking up during surgery  . Depression   . Migraines    01/02/2018 "they pop up out of the blue; no pattern"  . PONV (postoperative nausea and vomiting)   . Seasonal asthma    no current med.  . Sleep apnea    mild per pt. - no CPAP use    Past Surgical History:  Procedure Laterality Date  . ABDOMINAL HYSTERECTOMY    . ANKLE FUSION Left 10/19/2014   Procedure: Tibiocalcaneal Fusion;  Surgeon: Newt Minion, MD;  Location: [place];  Service: Orthopedics;  Laterality: Left;  . BILATERAL SALPINGECTOMY N/A 01/21/2013   Procedure: BILATERAL SALPINGECTOMY;  Surgeon: Thornell Sartorius, MD;  Location: [facility];  Service: Gynecology;  Laterality: N/A;  . BREAST BIOPSY Right   . BREAST IMPLANT EXCHANGE Bilateral 04/29/2017   Procedure: REVISION OF BILATERAL RECONSTRUCTION WITH SILICONE IMPLANT EXCHANGE AND ALLODERM BILATERAL BREAST;  Surgeon: Irene Limbo, MD;  Location: [place];  Service: Plastics;  Laterality: Bilateral;  . BREAST IMPLANT REMOVAL Left 07/16/2017   Procedure: REMOVAL BREAST IMPLANTS WITH DEBRIDEMENT OF BREAST FLAP;  Surgeon: Irene Limbo, MD;  Location: [place];  Service: Plastics;  Laterality: Left;  . BREAST RECONSTRUCTION WITH PLACEMENT OF TISSUE EXPANDER AND FLEX HD (ACELLULAR HYDRATED DERMIS) Bilateral 10/29/2016   Procedure: BILATERAL BREAST RECONSTRUCTION WITH PLACEMENT OF TISSUE EXPANDER AND  CORTIVA;  Surgeon: Irene Limbo, MD;  Location: [place];  Service: Plastics;  Laterality: Bilateral;  . EXTERNAL FIXATION LEG Left 10/14/2014   Procedure: EXTERNAL FIXATION LEFT ANKLE;  Surgeon: Marianna Payment, MD;  Location: [place];  Service: Orthopedics;  Laterality: Left;  . EXTERNAL FIXATION REMOVAL Left 10/19/2014   Procedure: REMOVAL EXTERNAL FIXATION LEG;  Surgeon: Newt Minion, MD;  Location: [place];  Service: Orthopedics;  Laterality: Left;  . FRACTURE SURGERY    . I&D EXTREMITY Left 10/14/2014   Procedure: IRRIGATION AND DEBRIDEMENT OPEN TIBIA FRACTURE LEFT LEG;  Surgeon: Marianna Payment, MD;  Location: [place];  Service: Orthopedics;  Laterality: Left;  . LAPAROSCOPIC ASSISTED VAGINAL HYSTERECTOMY N/A 01/21/2013   Procedure: LAPAROSCOPIC ASSISTED VAGINAL HYSTERECTOMY;  Surgeon: Thornell Sartorius, MD;  Location: [facility];  Service: Gynecology;  Laterality: N/A;  . LATISSIMUS FLAP TO BREAST Left 01/02/2018   Procedure: LATISSIMUS FLAP TO BREAST;  Surgeon: Irene Limbo, MD;  Location: [place];  Service: Plastics;  Laterality: Left;  .  MASTECTOMY    . NIPPLE SPARING MASTECTOMY Bilateral 10/29/2016   Procedure: BILATERAL NIPPLE SPARING MASTECTOMIES;  Surgeon: Donnie Mesa, MD;  Location: [place];  Service: General;  Laterality: Bilateral;  . NOVASURE ABLATION  03/13/2007  . RECONSTRUCTION BREAST IMMEDIATE / DELAYED W/ TISSUE EXPANDER Left 01/02/2018   LATISSIMUS FLAP TO BREAST;  TISSUE EXPANDER   . REMOVAL OF BILATERAL TISSUE EXPANDERS WITH PLACEMENT OF BILATERAL BREAST IMPLANTS Bilateral 01/21/2017   Procedure: REMOVAL OF BILATERAL TISSUE  EXPANDERS WITH PLACEMENT OF BILATERAL BREAST IMPLANTS;  Surgeon: Irene Limbo, MD;  Location: [place];  Service: Plastics;  Laterality: Bilateral;  . THYROID LOBECTOMY Left 10/01/2013   Procedure: THYROID LOBECTOMY;  Surgeon: Madilyn Hook, DO;  Location[facility];  Service: General;  Laterality: Left;  . TISSUE EXPANDER PLACEMENT Left 01/02/2018   Procedure: TISSUE EXPANDER;  Surgeon: Irene Limbo, MD;  Location: [place];  Service: Plastics;  Laterality: Left;  . TONSILLECTOMY    . TUBAL LIGATION      There were no vitals filed for this visit.  Subjective Assessment - 02/11/18 1110    Subjective  Pt states she is having back pain. She is trying to exercise, but she is still having the cording.  .     Pertinent History  Diagnosed with left breast cancer 2016 or 2017. Early stage cancer so no other treatment; no lymph nodes removed. Plan was to do a double mastectomy and have implants in both; implants got infected. Got a second infection where the implant ripped through the skin.  On the right she had the expander and implant, and that side's okay. Seasonal allergies and mild asthma. Had ankle fusion in August 2018 for an old injury.    Diagnostic tests  Hopefully I can get some type of movement back in my arm where I can lift it all the way up.     Currently in Pain?  No/denies                       [facility] Adult PT Treatment/Exercise - 02/11/18 0001      Self-Care   Self-Care  Other Self-Care Comments    Other Self-Care Comments   provided tg soft to left amr from wrist to arm pit.  Pt instructed to remove it at any sign of discomfort       Shoulder Exercises: Supine   Protraction  AROM;Right;Left;10 reps    External Rotation  AROM;Right;Left;5 reps    Flexion  AAROM;Right;Left with dowel rod x 10 reps       Shoulder Exercises: Sidelying   External Rotation  AROM;Left;10 reps with folded towel at waist     ABduction  AROM;Right;5 reps to 90 degrees      Other Sidelying Exercises  small circles with hand pointed to ceiling       Shoulder Exercises: Pulleys   Flexion  2 minutes    Flexion Limitations  cues to keep elbows straight.  Pt very tentative about movment       Shoulder Exercises: Stretch   Wall Stretch - Flexion  3 reps    Wall Stretch - Flexion Limitations  pt to tender with this exercises and tends to hike shoulders up too high      Manual Therapy   Manual Therapy  Myofascial release;Passive ROM    Manual therapy comments  pt very sensitive to touch especially to left back and axilla     Myofascial Release  with  crossed hands to cording at antecubital fossa with one small "pop' perceived.     Passive ROM  to left shoulder flexion, abduciton and rotation in available ranges                PT Short Term Goals - 02/04/18 2014      PT SHORT TERM GOAL #1   Title  Left shoulder active flexion to at least 120 degrees for improved overhead reach.    Baseline  At eval, left is 88 compared to 158 on right.    Time  4    Period  Weeks    Status  New      PT SHORT TERM GOAL #2   Title  Left shoulder active abduction to at least 110 degrees    Baseline  At eval, 76 degrees on left compared to 170 on right    Time  4    Period  Weeks    Status  New      PT SHORT TERM GOAL #3   Title  left shoulder active external rotation to at least 75 degrees, right to 80 degrees    Baseline  At eval, left is 67 compared to 76 on right    Time  4    Period  Weeks    Status  New      PT SHORT TERM GOAL #4   Title  Pt. will report at least 25% decrease in discomfort in her left arm    Time  4    Period  Weeks    Status  New        PT Long Term Goals - 02/04/18 2018      PT LONG TERM GOAL #1   Title  Left shoulder active flexion to at least 140 degrees for improved overhead reach    Baseline  88 at eval on left compared to 158 on right    Time  8    Period  Weeks    Status  New      PT LONG TERM GOAL #2   Title  Left  shoulder active abduction to at least 140 degrees for improved ADLs    Baseline  At eval, 76 on left compared to 170 on right    Time  8    Period  Weeks    Status  New      PT LONG TERM GOAL #3   Title  left shoulder active external rotation to 80 degrees    Baseline  at eval, 67 on left compared to 76 on right    Time  8    Period  Weeks    Status  New      PT LONG TERM GOAL #4   Title  Pt. will report at least 60% decrease in left arm pain    Time  8    Period  Weeks    Status  New      PT LONG TERM GOAL #5   Title  Quick DASH score reduced to 20 or less    Baseline  54.55 at eval    Time  8    Period  Weeks    Status  New            Plan - 02/11/18 1201    Clinical Impression Statement  Pt continues to have prominent cording in her left axilla and elbow with limitations in AROM but it is improving  Added tg soft  today for comfort and pt had a positive response to will send prescription to Dr. Iran Planas to sign for a class one compression sleeve.  Anticipate pt will progress slowly as she is having increased painful respornse to touch and activity     Rehab Potential  Good    PT Frequency  2x / week    PT Duration  8 weeks    PT Treatment/Interventions  ADLs/Self Care Home Management;Moist Heat;Therapeutic exercise;Patient/family education;Manual techniques;Manual lymph drainage;Scar mobilization;Passive range of motion    PT Next Visit Plan  continue HEP given today.and progress stretching as able.  See if script is returned and give info about where to get a sleeve   Continue P/AA/AROM, soft tissue mobilization and myofascial release for cording at left axilla and down left arm.    Consulted and Agree with Plan of Care  Patient       Patient will benefit from skilled therapeutic intervention in order to improve the following deficits and impairments:  Hypermobility, Decreased range of motion, Increased fascial restricitons, Impaired UE functional use, Pain  Visit  Diagnosis: Stiffness of left shoulder, not elsewhere classified  Left shoulder pain, unspecified chronicity  Aftercare following surgery for neoplasm     Problem List Patient Active Problem List   Diagnosis Date Noted  . Acquired absence of breast and nipple 01/21/2017  . Fibrocystic breast changes, bilateral 10/29/2016  . Fibrocystic breast disease (FCBD) in female 04/17/2016  . Talus fracture 10/14/2014  . Preoperative clearance 04/12/2014  . Wound, surgical, infected 10/18/2013  . S/P laparoscopic assisted vaginal hysterectomy (LAVH) 01/21/2013  . Menorrhagia with regular cycle 01/20/2013  . S/P endometrial ablation 01/20/2013  . OBSTRUCTIVE SLEEP APNEA 07/20/2009  . ALLERGIC RHINITIS 07/20/2009  . ASTHMA 07/20/2009  . DYSPNEA 07/20/2009   Donato Heinz. Owens Shark PT  Norwood Levo 02/11/2018, 12:05 PM  [place], [place], [address] Phone: [phone]   Fax:  [phone]  Name: [name] MRN: [id number] Date of Birth: 09-05-1976

## 2018-02-16 ENCOUNTER — Ambulatory Visit: Payer: Medicare Other | Admitting: Physical Therapy

## 2018-02-18 ENCOUNTER — Ambulatory Visit: Payer: Medicare Other | Admitting: Physical Therapy

## 2018-02-18 DIAGNOSIS — M25612 Stiffness of left shoulder, not elsewhere classified: Secondary | ICD-10-CM

## 2018-02-18 DIAGNOSIS — M25512 Pain in left shoulder: Secondary | ICD-10-CM

## 2018-02-18 DIAGNOSIS — Z483 Aftercare following surgery for neoplasm: Secondary | ICD-10-CM | POA: Diagnosis not present

## 2018-02-18 NOTE — Therapy (Signed)
[place], [place], [address] Phone: [phone]   Fax:  [phone]  Physical Therapy Treatment  Patient Details  Name: [name] MRN: [id number] Date of Birth: Jun 29, 1976 Referring Provider: Dr. Irene Limbo   Encounter Date: 02/18/2018  PT End of Session - 02/18/18 1635    Visit Number  3    Number of Visits  17    Date for PT Re-Evaluation  04/06/18    PT Start Time  1347    PT Stop Time  1436    PT Time Calculation (min)  49 min    Activity Tolerance  Patient tolerated treatment well    Behavior During Therapy  [facility] for tasks assessed/performed       Past Medical History:  Diagnosis Date  . Anemia    no current med.  . Anxiety   . Arthritis    left ankle (01/02/2018)  . Bilateral breast cancer ([place])    abnormal cells to both breast   . Chronic bronchitis ([place])   . Complication of anesthesia    hx. of waking up during surgery  . Depression   . Migraines    01/02/2018 "they pop up out of the blue; no pattern"  . PONV (postoperative nausea and vomiting)   . Seasonal asthma    no current med.  . Sleep apnea    mild per pt. - no CPAP use    Past Surgical History:  Procedure Laterality Date  . ABDOMINAL HYSTERECTOMY    . ANKLE FUSION Left 10/19/2014   Procedure: Tibiocalcaneal Fusion;  Surgeon: Newt Minion, MD;  Location: [place];  Service: Orthopedics;  Laterality: Left;  . BILATERAL SALPINGECTOMY N/A 01/21/2013   Procedure: BILATERAL SALPINGECTOMY;  Surgeon: Thornell Sartorius, MD;  Location: [facility];  Service: Gynecology;  Laterality: N/A;  . BREAST BIOPSY Right   . BREAST IMPLANT EXCHANGE Bilateral 04/29/2017   Procedure: REVISION OF BILATERAL RECONSTRUCTION WITH SILICONE IMPLANT EXCHANGE AND ALLODERM BILATERAL BREAST;  Surgeon: Irene Limbo, MD;  Location: [place];  Service: Plastics;  Laterality: Bilateral;  . BREAST IMPLANT REMOVAL Left 07/16/2017   Procedure: REMOVAL BREAST IMPLANTS WITH DEBRIDEMENT OF BREAST FLAP;  Surgeon: Irene Limbo, MD;  Location: [place];  Service: Plastics;  Laterality: Left;  . BREAST RECONSTRUCTION WITH PLACEMENT OF TISSUE EXPANDER AND FLEX HD (ACELLULAR HYDRATED DERMIS) Bilateral 10/29/2016   Procedure: BILATERAL BREAST RECONSTRUCTION WITH PLACEMENT OF TISSUE EXPANDER AND  CORTIVA;  Surgeon: Irene Limbo, MD;  Location: [place];  Service: Plastics;  Laterality: Bilateral;  . EXTERNAL FIXATION LEG Left 10/14/2014   Procedure: EXTERNAL FIXATION LEFT ANKLE;  Surgeon: Marianna Payment, MD;  Location: [place];  Service: Orthopedics;  Laterality: Left;  . EXTERNAL FIXATION REMOVAL Left 10/19/2014   Procedure: REMOVAL EXTERNAL FIXATION LEG;  Surgeon: Newt Minion, MD;  Location: [place];  Service: Orthopedics;  Laterality: Left;  . FRACTURE SURGERY    . I&D EXTREMITY Left 10/14/2014   Procedure: IRRIGATION AND DEBRIDEMENT OPEN TIBIA FRACTURE LEFT LEG;  Surgeon: Marianna Payment, MD;  Location: [place];  Service: Orthopedics;  Laterality: Left;  . LAPAROSCOPIC ASSISTED VAGINAL HYSTERECTOMY N/A 01/21/2013   Procedure: LAPAROSCOPIC ASSISTED VAGINAL HYSTERECTOMY;  Surgeon: Thornell Sartorius, MD;  Location: [facility];  Service: Gynecology;  Laterality: N/A;  . LATISSIMUS FLAP TO BREAST Left 01/02/2018   Procedure: LATISSIMUS FLAP TO BREAST;  Surgeon: Irene Limbo, MD;  Location: [place];  Service: Plastics;  Laterality: Left;  .  MASTECTOMY    . NIPPLE SPARING MASTECTOMY Bilateral 10/29/2016   Procedure: BILATERAL NIPPLE SPARING MASTECTOMIES;  Surgeon: Donnie Mesa, MD;  Location: [place];  Service: General;  Laterality: Bilateral;  . NOVASURE ABLATION  03/13/2007  . RECONSTRUCTION BREAST IMMEDIATE / DELAYED W/ TISSUE EXPANDER Left 01/02/2018   LATISSIMUS FLAP TO BREAST;  TISSUE EXPANDER   . REMOVAL OF BILATERAL TISSUE EXPANDERS WITH PLACEMENT OF BILATERAL BREAST IMPLANTS Bilateral 01/21/2017   Procedure: REMOVAL OF BILATERAL TISSUE  EXPANDERS WITH PLACEMENT OF BILATERAL BREAST IMPLANTS;  Surgeon: Irene Limbo, MD;  Location: [place];  Service: Plastics;  Laterality: Bilateral;  . THYROID LOBECTOMY Left 10/01/2013   Procedure: THYROID LOBECTOMY;  Surgeon: Madilyn Hook, DO;  Location[facility];  Service: General;  Laterality: Left;  . TISSUE EXPANDER PLACEMENT Left 01/02/2018   Procedure: TISSUE EXPANDER;  Surgeon: Irene Limbo, MD;  Location: [place];  Service: Plastics;  Laterality: Left;  . TONSILLECTOMY    . TUBAL LIGATION      There were no vitals filed for this visit.  Subjective Assessment - 02/18/18 1349    Subjective  Feeling stiff.  When she lies on left side for a long time she gets stiff. Has been doing the exercises every other day. Felt okay after last session.    Pertinent History  Diagnosed with left breast cancer 2016 or 2017. Early stage cancer so no other treatment; no lymph nodes removed. Plan was to do a double mastectomy and have implants in both; implants got infected. Got a second infection where the implant ripped through the skin.  On the right she had the expander and implant, and that side's okay. Seasonal allergies and mild asthma. Had ankle fusion in August 2018 for an old injury.    Currently in Pain?  Yes    Pain Score  7     Pain Location  Flank    Pain Orientation  Left    Pain Descriptors / Indicators  Other (Comment) slight    Aggravating Factors   trying to stretch out or laying arm underneath pillow    Pain Relieving Factors  tries to ignore it         [facility] PT Assessment - 02/18/18 0001      AROM   Left Shoulder Flexion  134 Degrees    Left Shoulder ABduction  104 Degrees                   [facility] Adult PT Treatment/Exercise - 02/18/18 0001      Modalities   Modalities  Moist Heat while stretching at left elbow and shoulder, prior to MFR      Moist Heat Therapy   Number Minutes Moist Heat  15 Minutes    Moist Heat Location  Shoulder left  axilla/upper flank      Manual Therapy   Manual Therapy  Soft tissue mobilization    Soft tissue mobilization  trying to release visible and palpable cording at left antecubital fossa and then at left axilla; also in right sidelying to cording at left axilla; in supine again with left shoulder at edge of mat and in approx. 90 degrees abduction, worked to release axillary cording    Myofascial Release  at left anticubetal fossa; across left axilla with elbow bent, then with elbow straight in supine; in right sidelying, from left upper arm to left lower flank    Passive ROM  In supine to left shoulder with stretch to tolerance for  er, abduction, and flexion.               PT Short Term Goals - 02/18/18 1638      PT SHORT TERM GOAL #1   Title  Left shoulder active flexion to at least 120 degrees for improved overhead reach.    Baseline  At eval, left is 88 compared to 158 on right. Achieved on 02/18/18.    Status  Achieved      PT SHORT TERM GOAL #2   Title  Left shoulder active abduction to at least 110 degrees    Baseline  At eval, 76 degrees on left compared to 170 on right; 104 on 02/18/18    Status  Partially Met      PT SHORT TERM GOAL #3   Title  left shoulder active external rotation to at least 75 degrees, right to 80 degrees    Status  On-going      PT SHORT TERM GOAL #4   Title  Pt. will report at least 25% decrease in discomfort in her left arm    Status  On-going        PT Long Term Goals - 02/04/18 2018      PT LONG TERM GOAL #1   Title  Left shoulder active flexion to at least 140 degrees for improved overhead reach    Baseline  88 at eval on left compared to 158 on right    Time  8    Period  Weeks    Status  New      PT LONG TERM GOAL #2   Title  Left shoulder active abduction to at least 140 degrees for improved ADLs    Baseline  At eval, 76 on left compared to 170 on right    Time  8    Period  Weeks    Status  New      PT LONG TERM GOAL #3    Title  left shoulder active external rotation to 80 degrees    Baseline  at eval, 67 on left compared to 76 on right    Time  8    Period  Weeks    Status  New      PT LONG TERM GOAL #4   Title  Pt. will report at least 60% decrease in left arm pain    Time  8    Period  Weeks    Status  New      PT LONG TERM GOAL #5   Title  Quick DASH score reduced to 20 or less    Baseline  54.55 at eval    Time  8    Period  Weeks    Status  New            Plan - 02/18/18 1636    Clinical Impression Statement  Focused on manual therapy today with use of moist heat at left axilla while stretching elbow and performing ROM to left shoulder, but prior to doing myofascial release and soft tissue mobilization to axillary cording.  Pt. tolerated therapy quite well, with just occasional discomfort.  Cording remains prominent with left shoulder in abduction, but AROM of left shoulder into flexion and abduction were both significantly improved today compared to at evaluation just a couple of sessions ago.    Rehab Potential  Good    PT Frequency  2x / week    PT Duration  8 weeks    PT  Treatment/Interventions  ADLs/Self Care Home Management;Moist Heat;Therapeutic exercise;Patient/family education;Manual techniques;Manual lymph drainage;Scar mobilization;Passive range of motion    PT Next Visit Plan  Include stretching for left trunk (right trunk rotation); continue HEP given last time and progress stretching as able.  See if script is returned and give info about where to get a sleeve   Continue P/AA/AROM, soft tissue mobilization and myofascial release for cording at left axilla and down left arm.    PT Home Exercise Plan  standing dowel stretches for left shoulder flexion and abduction; doorway stretch    Consulted and Agree with Plan of Care  Patient       Patient will benefit from skilled therapeutic intervention in order to improve the following deficits and impairments:  Hypermobility, Decreased  range of motion, Increased fascial restricitons, Impaired UE functional use, Pain  Visit Diagnosis: Stiffness of left shoulder, not elsewhere classified  Left shoulder pain, unspecified chronicity  Aftercare following surgery for neoplasm     Problem List Patient Active Problem List   Diagnosis Date Noted  . Acquired absence of breast and nipple 01/21/2017  . Fibrocystic breast changes, bilateral 10/29/2016  . Fibrocystic breast disease (FCBD) in female 04/17/2016  . Talus fracture 10/14/2014  . Preoperative clearance 04/12/2014  . Wound, surgical, infected 10/18/2013  . S/P laparoscopic assisted vaginal hysterectomy (LAVH) 01/21/2013  . Menorrhagia with regular cycle 01/20/2013  . S/P endometrial ablation 01/20/2013  . OBSTRUCTIVE SLEEP APNEA 07/20/2009  . ALLERGIC RHINITIS 07/20/2009  . ASTHMA 07/20/2009  . DYSPNEA 07/20/2009    SALISBURY,DONNA 02/18/2018, 4:40 PM  [place], [place], [address] Phone: [phone]   Fax:  [phone]  Name: LEVERNE TESSLER MRN: [id number] Date of Birth: 04/27/1976  Serafina Royals, PT 02/18/18 4:40 PM

## 2018-02-23 ENCOUNTER — Ambulatory Visit: Payer: Medicare Other | Admitting: Physical Therapy

## 2018-02-23 DIAGNOSIS — M25612 Stiffness of left shoulder, not elsewhere classified: Secondary | ICD-10-CM | POA: Diagnosis not present

## 2018-02-23 DIAGNOSIS — Z483 Aftercare following surgery for neoplasm: Secondary | ICD-10-CM

## 2018-02-23 DIAGNOSIS — M25512 Pain in left shoulder: Secondary | ICD-10-CM | POA: Diagnosis not present

## 2018-02-23 NOTE — Therapy (Signed)
[place], [place], [address] Phone: [phone]   Fax:  [phone]  Physical Therapy Treatment  Patient Details  Name: [name] MRN: [id number] Date of Birth: Jul 26, 1976 Referring Provider: Dr. Irene Limbo   Encounter Date: 02/23/2018  PT End of Session - 02/23/18 1714    Visit Number  4    Number of Visits  17    Date for PT Re-Evaluation  04/06/18    PT Start Time  2706    PT Stop Time  1559    PT Time Calculation (min)  43 min    Activity Tolerance  Patient tolerated treatment well    Behavior During Therapy  [facility] for tasks assessed/performed       Past Medical History:  Diagnosis Date  . Anemia    no current med.  . Anxiety   . Arthritis    left ankle (01/02/2018)  . Bilateral breast cancer ([place])    abnormal cells to both breast   . Chronic bronchitis ([place])   . Complication of anesthesia    hx. of waking up during surgery  . Depression   . Migraines    01/02/2018 "they pop up out of the blue; no pattern"  . PONV (postoperative nausea and vomiting)   . Seasonal asthma    no current med.  . Sleep apnea    mild per pt. - no CPAP use    Past Surgical History:  Procedure Laterality Date  . ABDOMINAL HYSTERECTOMY    . ANKLE FUSION Left 10/19/2014   Procedure: Tibiocalcaneal Fusion;  Surgeon: Newt Minion, MD;  Location: [place];  Service: Orthopedics;  Laterality: Left;  . BILATERAL SALPINGECTOMY N/A 01/21/2013   Procedure: BILATERAL SALPINGECTOMY;  Surgeon: Thornell Sartorius, MD;  Location: [facility];  Service: Gynecology;  Laterality: N/A;  . BREAST BIOPSY Right   . BREAST IMPLANT EXCHANGE Bilateral 04/29/2017   Procedure: REVISION OF BILATERAL RECONSTRUCTION WITH SILICONE IMPLANT EXCHANGE AND ALLODERM BILATERAL BREAST;  Surgeon: Irene Limbo, MD;  Location: [facility];  Service: Plastics;  Laterality: Bilateral;  . BREAST IMPLANT REMOVAL Left 07/16/2017   Procedure: REMOVAL BREAST IMPLANTS WITH DEBRIDEMENT OF BREAST FLAP;  Surgeon: Irene Limbo, MD;  Location: [facility];  Service: Plastics;  Laterality: Left;  . BREAST RECONSTRUCTION WITH PLACEMENT OF TISSUE EXPANDER AND FLEX HD (ACELLULAR HYDRATED DERMIS) Bilateral 10/29/2016   Procedure: BILATERAL BREAST RECONSTRUCTION WITH PLACEMENT OF TISSUE EXPANDER AND  CORTIVA;  Surgeon: Irene Limbo, MD;  Location: [facility];  Service: Plastics;  Laterality: Bilateral;  . EXTERNAL FIXATION LEG Left 10/14/2014   Procedure: EXTERNAL FIXATION LEFT ANKLE;  Surgeon: Marianna Payment, MD;  Location: [facility];  Service: Orthopedics;  Laterality: Left;  . EXTERNAL FIXATION REMOVAL Left 10/19/2014   Procedure: REMOVAL EXTERNAL FIXATION LEG;  Surgeon: Newt Minion, MD;  Location: [facility];  Service: Orthopedics;  Laterality: Left;  . FRACTURE SURGERY    . I&D EXTREMITY Left 10/14/2014   Procedure: IRRIGATION AND DEBRIDEMENT OPEN TIBIA FRACTURE LEFT LEG;  Surgeon: Marianna Payment, MD;  Location: [facility];  Service: Orthopedics;  Laterality: Left;  . LAPAROSCOPIC ASSISTED VAGINAL HYSTERECTOMY N/A 01/21/2013   Procedure: LAPAROSCOPIC ASSISTED VAGINAL HYSTERECTOMY;  Surgeon: Thornell Sartorius, MD;  Location: [facility];  Service: Gynecology;  Laterality: N/A;  . LATISSIMUS FLAP TO BREAST Left 01/02/2018   Procedure: LATISSIMUS FLAP TO BREAST;  Surgeon: Irene Limbo, MD;  Location: [facility];  Service: Plastics;  Laterality: Left;  .  MASTECTOMY    . NIPPLE SPARING MASTECTOMY Bilateral 10/29/2016   Procedure: BILATERAL NIPPLE SPARING MASTECTOMIES;  Surgeon: Donnie Mesa, MD;  Location: [facility];  Service: General;  Laterality: Bilateral;  . NOVASURE ABLATION  03/13/2007  . RECONSTRUCTION BREAST IMMEDIATE / DELAYED W/ TISSUE EXPANDER Left 01/02/2018   LATISSIMUS FLAP TO BREAST;  TISSUE EXPANDER   . REMOVAL OF BILATERAL TISSUE EXPANDERS WITH PLACEMENT OF BILATERAL BREAST IMPLANTS Bilateral 01/21/2017   Procedure: REMOVAL OF BILATERAL TISSUE  EXPANDERS WITH PLACEMENT OF BILATERAL BREAST IMPLANTS;  Surgeon: Irene Limbo, MD;  Location: [facility];  Service: Plastics;  Laterality: Bilateral;  . THYROID LOBECTOMY Left 10/01/2013   Procedure: THYROID LOBECTOMY;  Surgeon: Madilyn Hook, DO;  Location: [facility];  Service: General;  Laterality: Left;  . TISSUE EXPANDER PLACEMENT Left 01/02/2018   Procedure: TISSUE EXPANDER;  Surgeon: Irene Limbo, MD;  Location: [facility];  Service: Plastics;  Laterality: Left;  . TONSILLECTOMY    . TUBAL LIGATION      There were no vitals filed for this visit.  Subjective Assessment - 02/23/18 1518    Subjective  "I'm fine.  After the last session, I was sore for a little bit. Felt a little looser, but not much."    Pertinent History  Diagnosed with left breast cancer 2016 or 2017. Early stage cancer so no other treatment; no lymph nodes removed. Plan was to do a double mastectomy and have implants in both; implants got infected. Got a second infection where the implant ripped through the skin.  On the right she had the expander and implant, and that side's okay. Seasonal allergies and mild asthma. Had ankle fusion in August 2018 for an old injury.    Diagnostic tests  Hopefully I can get some type of movement back in my arm where I can lift it all the way up.     Currently in Pain?  No/denies                       [facility] Adult PT Treatment/Exercise - 02/23/18 0001      Shoulder Exercises: Supine   Other Supine Exercises  in hooklying with arms outstretched, lower trunk rotation each way x 30 seconds, trunk rotation to the right x 2      Modalities   Modalities  Moist Heat while stretching at left elbow and shoulder, prior to [place]      Moist Heat Therapy   Number Minutes Moist Heat  15 Minutes while stretching shoulder    Moist Heat Location  Shoulder left axilla/upper flank      Manual Therapy   Soft tissue mobilization  trying to release cording at left axilla with  left shoulder in abduction    Myofascial Release  left UE myofascial pulling with movement into abduction, the trying to release cording at left antecubital fossa and axilla    Passive ROM  In supine to left shoulder with stretch to tolerance for er, abduction, and flexion as well as horizontal abduction             PT Education - 02/23/18 1713    Education provided  Yes    Education Details  hooklying lower trunk rotatio with arms outstretched, child's pose and child's pose with right trunk flexion, standing left shoulder abduction with right trunk flexion    Person(s) Educated  Patient    Methods  Explanation;Demonstration;Verbal cues;Handout    Comprehension  Verbalized understanding;Returned demonstration  PT Short Term Goals - 02/18/18 1638      PT SHORT TERM GOAL #1   Title  Left shoulder active flexion to at least 120 degrees for improved overhead reach.    Baseline  At eval, left is 88 compared to 158 on right. Achieved on 02/18/18.    Status  Achieved      PT SHORT TERM GOAL #2   Title  Left shoulder active abduction to at least 110 degrees    Baseline  At eval, 76 degrees on left compared to 170 on right; 104 on 02/18/18    Status  Partially Met      PT SHORT TERM GOAL #3   Title  left shoulder active external rotation to at least 75 degrees, right to 80 degrees    Status  On-going      PT SHORT TERM GOAL #4   Title  Pt. will report at least 25% decrease in discomfort in her left arm    Status  On-going        PT Long Term Goals - 02/04/18 2018      PT LONG TERM GOAL #1   Title  Left shoulder active flexion to at least 140 degrees for improved overhead reach    Baseline  88 at eval on left compared to 158 on right    Time  8    Period  Weeks    Status  New      PT LONG TERM GOAL #2   Title  Left shoulder active abduction to at least 140 degrees for improved ADLs    Baseline  At eval, 76 on left compared to 170 on right    Time  8    Period  Weeks     Status  New      PT LONG TERM GOAL #3   Title  left shoulder active external rotation to 80 degrees    Baseline  at eval, 67 on left compared to 76 on right    Time  8    Period  Weeks    Status  New      PT LONG TERM GOAL #4   Title  Pt. will report at least 60% decrease in left arm pain    Time  8    Period  Weeks    Status  New      PT LONG TERM GOAL #5   Title  Quick DASH score reduced to 20 or less    Baseline  54.55 at eval    Time  8    Period  Weeks    Status  New            Plan - 02/23/18 1714    Clinical Impression Statement  Continued work on stretching and myofascial release for cording at left axilla and upper arm as well as tightness in the area; also PROM there. She tolerated manual work well.  Still with visible and palpable cording, though her arm moves through greater range than it did previously.  She felt the HEP she had been given wasn't doing that much at this point, so she was given new stretches for home exercise today.    Rehab Potential  Good    PT Frequency  2x / week    PT Duration  8 weeks    PT Next Visit Plan   See if script is returned and give info about where to get a sleeve   Continue  P/AA/AROM, soft tissue mobilization and myofascial release for cording at left axilla and down left arm.    PT Home Exercise Plan  new HEP as per education section in this note    Recommended Other Services  will refax compression sleeve prescription, as one has not been returned    Consulted and Agree with Plan of Care  Patient       Patient will benefit from skilled therapeutic intervention in order to improve the following deficits and impairments:  Hypermobility, Decreased range of motion, Increased fascial restricitons, Impaired UE functional use, Pain  Visit Diagnosis: Stiffness of left shoulder, not elsewhere classified  Left shoulder pain, unspecified chronicity  Aftercare following surgery for neoplasm     Problem List Patient Active  Problem List   Diagnosis Date Noted  . Acquired absence of breast and nipple 01/21/2017  . Fibrocystic breast changes, bilateral 10/29/2016  . Fibrocystic breast disease (FCBD) in female 04/17/2016  . Talus fracture 10/14/2014  . Preoperative clearance 04/12/2014  . Wound, surgical, infected 10/18/2013  . S/P laparoscopic assisted vaginal hysterectomy (LAVH) 01/21/2013  . Menorrhagia with regular cycle 01/20/2013  . S/P endometrial ablation 01/20/2013  . OBSTRUCTIVE SLEEP APNEA 07/20/2009  . ALLERGIC RHINITIS 07/20/2009  . ASTHMA 07/20/2009  . DYSPNEA 07/20/2009    [name] 02/23/2018, 5:20 PM  [place], [place], [address] Phone: [phone]   Fax:  [phone]  Name: [name] MRN: [id number] Date of Birth: 1976/06/14  Serafina Royals, PT 02/23/18 5:20 PM

## 2018-02-23 NOTE — Patient Instructions (Signed)
Lower Trunk Rotation    Bring both knees in to chest. Rotate from side to side, keeping knees together and feet off floor. Hold for 30 seconds. Repeat __2__ times per set. Do __1__ sets per session. Do __2__ sessions per day.  http://orth.exer.us/151   Copyright  VHI. All rights reserved.   Torso (Standing)    Inhaling, stretch left arm up toward ceiling. Exhaling, lean torso to the right. Hold position for _30__ seconds. Inhaling, return to center. Repeat _2__ times.  Do _2__ times per day.  Copyright  VHI. All rights reserved.    BACK: Child's Pose (Sciatica)    Sit in knee-chest position and reach arms forward. Separate knees for comfort. Hold position for _30__ seconds. Repeat __2_ times. Do __2_ times per day.   IN THIS SAME STARTING POSTION, WALK YOUR HANDS TO THE RIGHT A LITTLE BIT TO ADD SOME STRETCH ALONG YOUR LEFT SIDE.  Copyright  VHI. All rights reserved.

## 2018-02-25 ENCOUNTER — Encounter: Payer: Medicare Other | Admitting: Physical Therapy

## 2018-03-02 ENCOUNTER — Encounter: Payer: Medicare Other | Admitting: Physical Therapy

## 2018-03-04 ENCOUNTER — Ambulatory Visit: Payer: Medicare Other

## 2018-03-09 ENCOUNTER — Ambulatory Visit: Payer: Medicare Other | Admitting: Physical Therapy

## 2018-03-09 NOTE — H&P (Signed)
  Subjective:     Patient ID: [name] is a 42 y.o. female.  HPI   8 weeks post left latissimus flap with placement TE. Plan implant exchange next month. In PT, states was told she had cording and sleeve also recommended.  Presented desiring bilateral mastectomies for breast density D with history fibrocystic disease, prior aspirations/resections. 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  Underwent bilateral NSM with immediate expander based reconstruction. Post op revision for severe rippling and inferior displacement implants. Fever noted POD#7, seroma fluid aspirated anterior to left implant and culture of this Serratia, sensitive to Bactrim. Presented at 2.5 month visit with gross implant exposure and underwent debridement mastectomy flap and removal implant 07/2017.  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 Lives with adult daughter. She quit smoking 06/2016  Prior 36 B, desires more lifted, "C" cup. Right mastectomy 453 g Left 346 g.   Review of Systems    Objective:   Physical Exam  Cardiovascular: Normal rate, regular rhythm and normal heart sounds.   Pulmonary/Chest: Effort normal and breath sounds normal.   Chest: left chest incision intact soft Right breast reconstruction with rippling unchanged Back flat scar maturing Axilla scar maturing SN to nipple R20 L 19.5 cm Nipple to IMF R 9 L 8.5 cm Midline to nipple R 12 L 8 cm  Assessment:     Dense breasts, Family history breast ca S/p bilateral NSM, TE/ADM (Cortiva) reconstruction S/p silicone implant exchange S/p revision with HCG implant exchange, ADM (Alloderm) bilateral Left breast implant extrusion/erosion with gross exposure s/p removal and debdridement. S/p left LD flap, TE placement    Plan:    Plan silicone implant exchange to left chest. Plan observation stay. No drains anticipated. Given severe rippling noted in  past with capacity filled implant, likely continue with highly cohesive gel implant. Reviewed may require volume different that prior and different from right given presence of LD flap. Reviewed risks contracture, infection requiring removal or additional surgery, MRI surveillance silicone implants, rupture, rippling reviewed. Additional risks including but not limited to bleeding, seroma, wound healing problems, hematoma, DVT/PE, cardiopulmonary complications reviewed.   RIGHT: Natrelle Inspira Smooth Round Cohesive gel implants 400 ml placed . REF [id number]   LEFT: Natrelle [id number]-[id number] 300 ml tissue expander placed,  265 Ml total fill volume  Irene Limbo, MD [facility] [phone], pin [phone]

## 2018-03-17 ENCOUNTER — Ambulatory Visit: Payer: Medicare Other | Attending: Plastic Surgery | Admitting: Physical Therapy

## 2018-03-17 DIAGNOSIS — M25612 Stiffness of left shoulder, not elsewhere classified: Secondary | ICD-10-CM | POA: Diagnosis not present

## 2018-03-17 DIAGNOSIS — Z483 Aftercare following surgery for neoplasm: Secondary | ICD-10-CM | POA: Diagnosis not present

## 2018-03-17 DIAGNOSIS — M25512 Pain in left shoulder: Secondary | ICD-10-CM | POA: Diagnosis not present

## 2018-03-17 NOTE — Therapy (Signed)
[place], [place], [address] Phone: [phone]   Fax:  [phone]  Physical Therapy Treatment  Patient Details  Name: [name] MRN: [id number] Date of Birth: 04/12/1976 Referring Provider: Dr. Irene Limbo   Encounter Date: 03/17/2018  PT End of Session - 03/17/18 1155    Visit Number  5    Number of Visits  17    Date for PT Re-Evaluation  04/06/18    PT Start Time  1105    PT Stop Time  1145    PT Time Calculation (min)  40 min    Activity Tolerance  Patient tolerated treatment well    Behavior During Therapy  [facility] for tasks assessed/performed       Past Medical History:  Diagnosis Date  . Anemia    no current med.  . Anxiety   . Arthritis    left ankle (01/02/2018)  . Bilateral breast cancer ([place])    abnormal cells to both breast   . Chronic bronchitis ([place])   . Complication of anesthesia    hx. of waking up during surgery  . Depression   . Migraines    01/02/2018 "they pop up out of the blue; no pattern"  . PONV (postoperative nausea and vomiting)   . Seasonal asthma    no current med.  . Sleep apnea    mild per pt. - no CPAP use    Past Surgical History:  Procedure Laterality Date  . ABDOMINAL HYSTERECTOMY    . ANKLE FUSION Left 10/19/2014   Procedure: Tibiocalcaneal Fusion;  Surgeon: Newt Minion, MD;  Location: [place];  Service: Orthopedics;  Laterality: Left;  . BILATERAL SALPINGECTOMY N/A 01/21/2013   Procedure: BILATERAL SALPINGECTOMY;  Surgeon: Thornell Sartorius, MD;  Location: [facility];  Service: Gynecology;  Laterality: N/A;  . BREAST BIOPSY Right   . BREAST IMPLANT EXCHANGE Bilateral 04/29/2017   Procedure: REVISION OF BILATERAL RECONSTRUCTION WITH SILICONE IMPLANT EXCHANGE AND ALLODERM BILATERAL BREAST;  Surgeon: Irene Limbo, MD;  Location: [place];  Service: Plastics;  Laterality: Bilateral;  . BREAST IMPLANT REMOVAL Left 07/16/2017   Procedure: REMOVAL BREAST IMPLANTS WITH DEBRIDEMENT OF BREAST FLAP;  Surgeon: Irene Limbo, MD;  Location: [place];  Service: Plastics;  Laterality: Left;  . BREAST RECONSTRUCTION WITH PLACEMENT OF TISSUE EXPANDER AND FLEX HD (ACELLULAR HYDRATED DERMIS) Bilateral 10/29/2016   Procedure: BILATERAL BREAST RECONSTRUCTION WITH PLACEMENT OF TISSUE EXPANDER AND  CORTIVA;  Surgeon: Irene Limbo, MD;  Location: [place];  Service: Plastics;  Laterality: Bilateral;  . EXTERNAL FIXATION LEG Left 10/14/2014   Procedure: EXTERNAL FIXATION LEFT ANKLE;  Surgeon: Marianna Payment, MD;  Location: [place];  Service: Orthopedics;  Laterality: Left;  . EXTERNAL FIXATION REMOVAL Left 10/19/2014   Procedure: REMOVAL EXTERNAL FIXATION LEG;  Surgeon: Newt Minion, MD;  Location: [place];  Service: Orthopedics;  Laterality: Left;  . FRACTURE SURGERY    . I&D EXTREMITY Left 10/14/2014   Procedure: IRRIGATION AND DEBRIDEMENT OPEN TIBIA FRACTURE LEFT LEG;  Surgeon: Marianna Payment, MD;  Location: [place];  Service: Orthopedics;  Laterality: Left;  . LAPAROSCOPIC ASSISTED VAGINAL HYSTERECTOMY N/A 01/21/2013   Procedure: LAPAROSCOPIC ASSISTED VAGINAL HYSTERECTOMY;  Surgeon: Thornell Sartorius, MD;  Location: [facility];  Service: Gynecology;  Laterality: N/A;  . LATISSIMUS FLAP TO BREAST Left 01/02/2018   Procedure: LATISSIMUS FLAP TO BREAST;  Surgeon: Irene Limbo, MD;  Location: [place];  Service: Plastics;  Laterality: Left;  .  MASTECTOMY    . NIPPLE SPARING MASTECTOMY Bilateral 10/29/2016   Procedure: BILATERAL NIPPLE SPARING MASTECTOMIES;  Surgeon: Donnie Mesa, MD;  Location: [place];  Service: General;  Laterality: Bilateral;  . NOVASURE ABLATION  03/13/2007  . RECONSTRUCTION BREAST IMMEDIATE / DELAYED W/ TISSUE EXPANDER Left 01/02/2018   LATISSIMUS FLAP TO BREAST;  TISSUE EXPANDER   . REMOVAL OF BILATERAL TISSUE EXPANDERS WITH PLACEMENT OF BILATERAL BREAST IMPLANTS Bilateral 01/21/2017   Procedure: REMOVAL OF BILATERAL TISSUE  EXPANDERS WITH PLACEMENT OF BILATERAL BREAST IMPLANTS;  Surgeon: Irene Limbo, MD;  Location: [place];  Service: Plastics;  Laterality: Bilateral;  . THYROID LOBECTOMY Left 10/01/2013   Procedure: THYROID LOBECTOMY;  Surgeon: Madilyn Hook, DO;  Location[facility];  Service: General;  Laterality: Left;  . TISSUE EXPANDER PLACEMENT Left 01/02/2018   Procedure: TISSUE EXPANDER;  Surgeon: Irene Limbo, MD;  Location: [place];  Service: Plastics;  Laterality: Left;  . TONSILLECTOMY    . TUBAL LIGATION      There were no vitals filed for this visit.  Subjective Assessment - 03/17/18 1114    Subjective  Pt states she has not been able to come to PT because of transportation problem She has gotten measured for a compression sleeve from [facility] and it should come in a couple of weeks.  Surgery for final implant is scheduled for May 21.  She is still having some pain in her back     Pertinent History  Diagnosed with left breast cancer 2016 or 2017. Early stage cancer so no other treatment; no lymph nodes removed. Plan was to do a double mastectomy and have implants in both; implants got infected. Got a second infection where the implant ripped through the skin.  On the right she had the expander and implant, and that side's okay. Seasonal allergies and mild asthma. Had ankle fusion in August 2018 for an old injury.    Patient Stated Goals  Hopefully I can get some type of movement back in my arm where I can lift it all the way up.     Currently in Pain?  Yes    Pain Score  8     Pain Location  Axilla    Pain Orientation  Left    Pain Descriptors / Indicators  Aching;Dull    Pain Radiating Towards  radiates down the side     Aggravating Factors   laying on the left side     Pain Relieving Factors  changes posistion          [facility] PT Assessment - 03/17/18 0001      Observation/Other Assessments   Observations  thick cording in  axilla still visible and persistent despite  brief attempt at myofascial stretching       AROM   Left Shoulder Flexion  165 Degrees    Left Shoulder ABduction  165 Degrees                   [facility] Adult PT Treatment/Exercise - 03/17/18 0001      Exercises   Exercises  Lumbar      Lumbar Exercises: Supine   Other Supine Lumbar Exercises  lower trunk rotation with manual stabalization of left scapula .  Pt with stretching of axillary cording       Lumbar Exercises: Quadruped   Madcat/Old Horse  5 reps    Madcat/Old Horse Limitations  pt needes verbal and tactile cues for movement, pt with decreased thoracic  extension     Single Arm Raise  Right;Left;5 reps    Straight Leg Raise  5 reps    Opposite Arm/Leg Raise  Right arm/Left leg;Left arm/Right leg;5 reps    Opposite Arm/Leg Raise Limitations  needed verbal and tactile cues as pt frequently lost balance       Shoulder Exercises: Supine   Protraction  AROM;Right;Left;20 reps 10 reps with each arm and then 10 reps with both arms       Shoulder Exercises: Sidelying   External Rotation  AROM;Left;10 reps;Weights with folded towel at waist     External Rotation Weight (lbs)  10 reps no weight, 10 reps 1# , 5 reps 2#  limited by fatigue     ABduction  AROM;Left    Other Sidelying Exercises  strengthening for deltoid with initial abduction with 2# weight for long arc exercise       Shoulder Exercises: Standing   Protraction  AROM;Left    Protraction Limitations  pt was not able to coordinate this activity despite multiple attempts, various substitutiions with elbow flexing and trunk twisting     ABduction  AROM;Right;Left;5 reps             PT Education - 03/17/18 1155    Education provided  Yes    Education Details  follow up community exercise program with [employer] program at the [facility]) Educated  Patient    Methods  Explanation;Handout    Comprehension  Verbalized understanding       PT Short Term Goals - 03/17/18 1159      PT SHORT  TERM GOAL #1   Title  Left shoulder active flexion to at least 120 degrees for improved overhead reach.    Baseline  At eval, left is 88 compared to 158 on right. Achieved on 02/18/18.    Status  Achieved      PT SHORT TERM GOAL #2   Title  Left shoulder active abduction to at least 110 degrees    Baseline  At eval, 76 degrees on left compared to 170 on right; 104 on 02/18/18, 165 on 5//05/2018    Status  Achieved      PT SHORT TERM GOAL #3   Title  left shoulder active external rotation to at least 75 degrees, right to 80 degrees    Baseline  At eval, left is 67 compared to 76 on right    Time  4    Status  On-going        PT Long Term Goals - 03/17/18 1200      PT LONG TERM GOAL #1   Title  Left shoulder active flexion to at least 140 degrees for improved overhead reach    Baseline  88 at eval on left compared to 158 on right, 165 on 03/17/2018    Status  Achieved      PT LONG TERM GOAL #2   Title  Left shoulder active abduction to at least 140 degrees for improved ADLs    Baseline  At eval, 76 on left compared to 170 on right, 165 on 03/17/2018    Status  Achieved      PT LONG TERM GOAL #3   Title  left shoulder active external rotation to 80 degrees    Baseline  at eval, 67 on left compared to 76 on right    Status  On-going      PT LONG TERM GOAL #4  Title  Pt. will report at least 60% decrease in left arm pain    Time  8    Period  Weeks    Status  On-going      PT LONG TERM GOAL #5   Title  Quick DASH score reduced to 20 or less    Baseline  54.55 at eval    Time  8    Period  Weeks    Status  On-going            Plan - 03/17/18 1156    Clinical Impression Statement  Pt has not been able to come to therapy due to transportation issues, but she has made good gains in shoulder ROM with goals met.  She continues to have tenderness to touch, but seemed to do better at end of session.  She is nervous about upcoming surgery but wants to get back to the gym for  continued strengthing when she is done with treatment.  Focused on scapular mobility and coordination today and pt did well with treatment     PT Next Visit Plan  Continue with scapular strenthening and coordination of movment especially with protraction and thoracic mobility        Patient will benefit from skilled therapeutic intervention in order to improve the following deficits and impairments:  Hypermobility, Decreased range of motion, Increased fascial restricitons, Impaired UE functional use, Pain  Visit Diagnosis: Stiffness of left shoulder, not elsewhere classified  Left shoulder pain, unspecified chronicity  Aftercare following surgery for neoplasm     Problem List Patient Active Problem List   Diagnosis Date Noted  . Acquired absence of breast and nipple 01/21/2017  . Fibrocystic breast changes, bilateral 10/29/2016  . Fibrocystic breast disease (FCBD) in female 04/17/2016  . Talus fracture 10/14/2014  . Preoperative clearance 04/12/2014  . Wound, surgical, infected 10/18/2013  . S/P laparoscopic assisted vaginal hysterectomy (LAVH) 01/21/2013  . Menorrhagia with regular cycle 01/20/2013  . S/P endometrial ablation 01/20/2013  . OBSTRUCTIVE SLEEP APNEA 07/20/2009  . ALLERGIC RHINITIS 07/20/2009  . ASTHMA 07/20/2009  . DYSPNEA 07/20/2009   Donato Heinz. Owens Shark PT  Norwood Levo 03/17/2018, 12:01 PM  [place], [place], [address] Phone: [phone]   Fax:  [phone]  Name: [name] MRN: [id number] Date of Birth: 1975/12/11

## 2018-03-20 ENCOUNTER — Ambulatory Visit: Payer: Medicare Other | Admitting: Physical Therapy

## 2018-03-20 ENCOUNTER — Encounter: Payer: Self-pay | Admitting: Physical Therapy

## 2018-03-20 DIAGNOSIS — Z483 Aftercare following surgery for neoplasm: Secondary | ICD-10-CM

## 2018-03-20 DIAGNOSIS — M25612 Stiffness of left shoulder, not elsewhere classified: Secondary | ICD-10-CM

## 2018-03-20 DIAGNOSIS — M25512 Pain in left shoulder: Secondary | ICD-10-CM

## 2018-03-20 NOTE — Patient Instructions (Signed)
First of all, check with your insurance company to see if provider is in network    [facility]   (for wigs and compression sleeves / gloves/gauntlets )  [address] [place], [place] [address] [phone]  Will file some insurances --- call for appointment   [facility] (for mastectomy prosthetics and garments) [address] [place], [place] [address] [phone] Will file some insurances --- call for appointment  [facility]  [address]  [place], [place] [address] [phone] Lower extremity garments  [facility] [address] [place],   [address] [phone]  [employer] Sales rep:  Matt Lawson:  [phone] [email or web address] Biocompression pumps   [employer]  Sales rep: Robert Rollins:  [phone] [email or web address] Entre and Flexitouch pumps    Other Resources: National Lymphedema Network:  www.lymphnet.org [email or web address] for patient articles and purchase a self manual lymph drainage DVD [email or web address] has informative articles.  

## 2018-03-20 NOTE — Therapy (Addendum)
[place], [place], [address] Phone: [phone]   Fax:  [phone]  Physical Therapy Treatment  Patient Details  Name: [name] MRN: [id number] Date of Birth: 1976-07-16 Referring Provider: Dr. Irene Limbo   Encounter Date: 03/20/2018  PT End of Session - 03/20/18 1241    Visit Number  6    Number of Visits  17    Date for PT Re-Evaluation  04/06/18    PT Start Time  0623    PT Stop Time  1100    PT Time Calculation (min)  45 min    Activity Tolerance  Patient tolerated treatment well    Behavior During Therapy  [facility] for tasks assessed/performed       Past Medical History:  Diagnosis Date  . Anemia    no current med.  . Anxiety   . Arthritis    left ankle (01/02/2018)  . Bilateral breast cancer ([place])    abnormal cells to both breast   . Chronic bronchitis ([place])   . Complication of anesthesia    hx. of waking up during surgery  . Depression   . Migraines    01/02/2018 "they pop up out of the blue; no pattern"  . PONV (postoperative nausea and vomiting)   . Seasonal asthma    no current med.  . Sleep apnea    mild per pt. - no CPAP use    Past Surgical History:  Procedure Laterality Date  . ABDOMINAL HYSTERECTOMY    . ANKLE FUSION Left 10/19/2014   Procedure: Tibiocalcaneal Fusion;  Surgeon: Newt Minion, MD;  Location: [place];  Service: Orthopedics;  Laterality: Left;  . BILATERAL SALPINGECTOMY N/A 01/21/2013   Procedure: BILATERAL SALPINGECTOMY;  Surgeon: Thornell Sartorius, MD;  Location: [facility];  Service: Gynecology;  Laterality: N/A;  . BREAST BIOPSY Right   . BREAST IMPLANT EXCHANGE Bilateral 04/29/2017   Procedure: REVISION OF BILATERAL RECONSTRUCTION WITH SILICONE IMPLANT EXCHANGE AND ALLODERM BILATERAL BREAST;  Surgeon: Irene Limbo, MD;  Location: [place];  Service: Plastics;  Laterality: Bilateral;  . BREAST IMPLANT REMOVAL Left 07/16/2017   Procedure: REMOVAL BREAST IMPLANTS WITH DEBRIDEMENT OF BREAST FLAP;  Surgeon: Irene Limbo, MD;  Location: [place];  Service: Plastics;  Laterality: Left;  . BREAST RECONSTRUCTION WITH PLACEMENT OF TISSUE EXPANDER AND FLEX HD (ACELLULAR HYDRATED DERMIS) Bilateral 10/29/2016   Procedure: BILATERAL BREAST RECONSTRUCTION WITH PLACEMENT OF TISSUE EXPANDER AND  CORTIVA;  Surgeon: Irene Limbo, MD;  Location: [place];  Service: Plastics;  Laterality: Bilateral;  . EXTERNAL FIXATION LEG Left 10/14/2014   Procedure: EXTERNAL FIXATION LEFT ANKLE;  Surgeon: Marianna Payment, MD;  Location: [place];  Service: Orthopedics;  Laterality: Left;  . EXTERNAL FIXATION REMOVAL Left 10/19/2014   Procedure: REMOVAL EXTERNAL FIXATION LEG;  Surgeon: Newt Minion, MD;  Location: [place];  Service: Orthopedics;  Laterality: Left;  . FRACTURE SURGERY    . I&D EXTREMITY Left 10/14/2014   Procedure: IRRIGATION AND DEBRIDEMENT OPEN TIBIA FRACTURE LEFT LEG;  Surgeon: Marianna Payment, MD;  Location: [place];  Service: Orthopedics;  Laterality: Left;  . LAPAROSCOPIC ASSISTED VAGINAL HYSTERECTOMY N/A 01/21/2013   Procedure: LAPAROSCOPIC ASSISTED VAGINAL HYSTERECTOMY;  Surgeon: Thornell Sartorius, MD;  Location: [facility];  Service: Gynecology;  Laterality: N/A;  . LATISSIMUS FLAP TO BREAST Left 01/02/2018   Procedure: LATISSIMUS FLAP TO BREAST;  Surgeon: Irene Limbo, MD;  Location: [place];  Service: Plastics;  Laterality: Left;  .  MASTECTOMY    . NIPPLE SPARING MASTECTOMY Bilateral 10/29/2016   Procedure: BILATERAL NIPPLE SPARING MASTECTOMIES;  Surgeon: Donnie Mesa, MD;  Location: [place];  Service: General;  Laterality: Bilateral;  . NOVASURE ABLATION  03/13/2007  . RECONSTRUCTION BREAST IMMEDIATE / DELAYED W/ TISSUE EXPANDER Left 01/02/2018   LATISSIMUS FLAP TO BREAST;  TISSUE EXPANDER   . REMOVAL OF BILATERAL TISSUE EXPANDERS WITH PLACEMENT OF BILATERAL BREAST IMPLANTS Bilateral 01/21/2017   Procedure: REMOVAL OF BILATERAL TISSUE  EXPANDERS WITH PLACEMENT OF BILATERAL BREAST IMPLANTS;  Surgeon: Irene Limbo, MD;  Location: [place];  Service: Plastics;  Laterality: Bilateral;  . THYROID LOBECTOMY Left 10/01/2013   Procedure: THYROID LOBECTOMY;  Surgeon: Madilyn Hook, DO;  [facility];  Service: General;  Laterality: Left;  . TISSUE EXPANDER PLACEMENT Left 01/02/2018   Procedure: TISSUE EXPANDER;  Surgeon: Irene Limbo, MD;  Location: [place];  Service: Plastics;  Laterality: Left;  . TONSILLECTOMY    . TUBAL LIGATION      There were no vitals filed for this visit.  Subjective Assessment - 03/20/18 1030    Subjective  pt states she feels a little better today     Pertinent History  Diagnosed with left breast cancer 2016 or 2017. Early stage cancer so no other treatment; no lymph nodes removed. Plan was to do a double mastectomy and have implants in both; implants got infected. Got a second infection where the implant ripped through the skin.  On the right she had the expander and implant, and that side's okay. Seasonal allergies and mild asthma. Had ankle fusion in August 2018 for an old injury.    Patient Stated Goals  Hopefully I can get some type of movement back in my arm where I can lift it all the way up.     Currently in Pain?  Yes    Pain Score  7     Pain Location  Back    Pain Orientation  Left    Pain Descriptors / Indicators  Tender    Pain Type  Chronic pain    Pain Radiating Towards  radiates a little down the side     Pain Onset  More than a month ago    Aggravating Factors   laying on the left side     Pain Relieving Factors  changes position                        [facility] Adult PT Treatment/Exercise - 03/20/18 0001      Self-Care   Self-Care  Other Self-Care Comments    Other Self-Care Comments   provided signed presctiptoin for compression sleeve and where to go get it       Elbow Exercises   Elbow Flexion  Strengthening;20 reps;Supine;Bar  weights/barbell 3      Lumbar Exercises: Supine   Straight Leg Raise  10 reps    Other Supine Lumbar Exercises  lower trunk rotation with manual stabalization of left scapula .  Pt with stretching of axillary cording     Other Supine Lumbar Exercises  single leg stretch ( bicycle)  with cue to keep abdominals engaged       Lumbar Exercises: Sidelying   Clam  Both;10 reps    Hip Abduction  Both;10 reps      Lumbar Exercises: Quadruped   Madcat/Old Horse  5 reps    Madcat/Old Horse Limitations  pt needes verbal and tactile cues for  movement, pt with decreased thoracic extension       Knee/Hip Exercises: Supine   Bridges with Diona Foley Squeeze  Strengthening;10 reps      Shoulder Exercises: Supine   Protraction  AROM;Right;Left;20 reps;Weights 10 reps with each arm and then 10 reps with both arms     Protraction Weight (lbs)  2     Other Supine Exercises  purple ball at thoracic spine with pillow under head and both arms reaching overhead for a stretch to c               PT Short Term Goals - 03/17/18 1159      PT SHORT TERM GOAL #1   Title  Left shoulder active flexion to at least 120 degrees for improved overhead reach.    Baseline  At eval, left is 88 compared to 158 on right. Achieved on 02/18/18.    Status  Achieved      PT SHORT TERM GOAL #2   Title  Left shoulder active abduction to at least 110 degrees    Baseline  At eval, 76 degrees on left compared to 170 on right; 104 on 02/18/18, 165 on 5//05/2018    Status  Achieved      PT SHORT TERM GOAL #3   Title  left shoulder active external rotation to at least 75 degrees, right to 80 degrees    Baseline  At eval, left is 67 compared to 76 on right    Time  4    Status  On-going        PT Long Term Goals - 03/17/18 1200      PT LONG TERM GOAL #1   Title  Left shoulder active flexion to at least 140 degrees for improved overhead reach    Baseline  88 at eval on left compared to 158 on right, 165 on 03/17/2018     Status  Achieved      PT LONG TERM GOAL #2   Title  Left shoulder active abduction to at least 140 degrees for improved ADLs    Baseline  At eval, 76 on left compared to 170 on right, 165 on 03/17/2018    Status  Achieved      PT LONG TERM GOAL #3   Title  left shoulder active external rotation to 80 degrees    Baseline  at eval, 67 on left compared to 76 on right    Status  On-going      PT LONG TERM GOAL #4   Title  Pt. will report at least 60% decrease in left arm pain    Time  8    Period  Weeks    Status  On-going      PT LONG TERM GOAL #5   Title  Quick DASH score reduced to 20 or less    Baseline  54.55 at eval    Time  8    Period  Weeks    Status  On-going            Plan - 03/20/18 1052    Clinical Impression Statement  pt is making lots of gains with exercise and stretching.  Cording is still present in axilla, and down into left arm, but is much less.     PT Frequency  2x / week    PT Next Visit Plan  Continue with scapular strenthening and coordination of movment especially with protraction and thoracic mobility     Consulted and  Agree with Plan of Care  Patient       Patient will benefit from skilled therapeutic intervention in order to improve the following deficits and impairments:  Hypermobility, Decreased range of motion, Increased fascial restricitons, Impaired UE functional use, Pain  Visit Diagnosis: Stiffness of left shoulder, not elsewhere classified  Left shoulder pain, unspecified chronicity  Aftercare following surgery for neoplasm     Problem List Patient Active Problem List   Diagnosis Date Noted  . Acquired absence of breast and nipple 01/21/2017  . Fibrocystic breast changes, bilateral 10/29/2016  . Fibrocystic breast disease (FCBD) in female 04/17/2016  . Talus fracture 10/14/2014  . Preoperative clearance 04/12/2014  . Wound, surgical, infected 10/18/2013  . S/P laparoscopic assisted vaginal hysterectomy (LAVH) 01/21/2013  .  Menorrhagia with regular cycle 01/20/2013  . S/P endometrial ablation 01/20/2013  . OBSTRUCTIVE SLEEP APNEA 07/20/2009  . ALLERGIC RHINITIS 07/20/2009  . ASTHMA 07/20/2009  . DYSPNEA 07/20/2009   Donato Heinz. Owens Shark PT  Norwood Levo 03/20/2018, 12:43 PM  [place], [place], [address] Phone: [phone]   Fax:  [phone]  Name: [name] MRN: [id number] Date of Birth: 06/09/1976  PHYSICAL THERAPY DISCHARGE SUMMARY  Visits from Start of Care: 6  Current functional level related to goals / functional outcomes: Pt did not come to last 2 PT visits and is scheduled to have surgery next week.  She has made significant functional gains and is doing much better with exercise, but objective assessments for goal completion were not able to be done.    Remaining deficits: Pain    Education / Equipment: Home exercise  Plan: Patient agrees to discharge.  Patient goals were partially met. Patient is being discharged due to not returning since the last visit.  ?????    Maudry Diego, PT 03/26/18 11:44 AM

## 2018-03-24 ENCOUNTER — Ambulatory Visit: Payer: Medicare Other | Admitting: Physical Therapy

## 2018-03-25 ENCOUNTER — Other Ambulatory Visit: Payer: Self-pay

## 2018-03-25 ENCOUNTER — Encounter (HOSPITAL_BASED_OUTPATIENT_CLINIC_OR_DEPARTMENT_OTHER): Payer: Self-pay | Admitting: *Deleted

## 2018-03-26 ENCOUNTER — Telehealth: Payer: Self-pay | Admitting: Physical Therapy

## 2018-03-26 ENCOUNTER — Ambulatory Visit: Payer: Medicare Other | Admitting: Physical Therapy

## 2018-03-26 NOTE — Telephone Encounter (Signed)
Pt did not come to therapy today as she is having transportation problems.  Spoke with patient.  She plans to have surgery next week and she is doing well.  Will discharge this episode at this time.  Pt agreed.  Donato Heinz. Owens Shark, PT

## 2018-03-27 NOTE — Progress Notes (Signed)
Ensure pre surgery drink given with instructions to complete by 0415 dos, surgical soap given with instructions, pt verbalized understanding. 

## 2018-03-31 ENCOUNTER — Ambulatory Visit (HOSPITAL_BASED_OUTPATIENT_CLINIC_OR_DEPARTMENT_OTHER): Payer: Medicare Other | Admitting: Anesthesiology

## 2018-03-31 ENCOUNTER — Encounter (HOSPITAL_BASED_OUTPATIENT_CLINIC_OR_DEPARTMENT_OTHER)
Admission: RE | Disposition: A | Discharge status: Home / Self Care | Payer: Self-pay | Source: Ambulatory Visit | Attending: Plastic Surgery

## 2018-03-31 ENCOUNTER — Encounter (HOSPITAL_BASED_OUTPATIENT_CLINIC_OR_DEPARTMENT_OTHER): Payer: Self-pay | Admitting: Emergency Medicine

## 2018-03-31 ENCOUNTER — Other Ambulatory Visit: Payer: Self-pay

## 2018-03-31 ENCOUNTER — Ambulatory Visit (HOSPITAL_BASED_OUTPATIENT_CLINIC_OR_DEPARTMENT_OTHER)
Admission: RE | Admit: 2018-03-31 | Discharge: 2018-04-01 | Disposition: A | Discharge status: Home / Self Care | Payer: Medicare Other | Source: Ambulatory Visit | Attending: Plastic Surgery | Admitting: Plastic Surgery

## 2018-03-31 DIAGNOSIS — G4730 Sleep apnea, unspecified: Secondary | ICD-10-CM | POA: Insufficient documentation

## 2018-03-31 DIAGNOSIS — Z9013 Acquired absence of bilateral breasts and nipples: Secondary | ICD-10-CM | POA: Insufficient documentation

## 2018-03-31 DIAGNOSIS — M1990 Unspecified osteoarthritis, unspecified site: Secondary | ICD-10-CM | POA: Insufficient documentation

## 2018-03-31 DIAGNOSIS — F418 Other specified anxiety disorders: Secondary | ICD-10-CM | POA: Diagnosis not present

## 2018-03-31 DIAGNOSIS — Z421 Encounter for breast reconstruction following mastectomy: Secondary | ICD-10-CM | POA: Insufficient documentation

## 2018-03-31 DIAGNOSIS — Z803 Family history of malignant neoplasm of breast: Secondary | ICD-10-CM | POA: Diagnosis not present

## 2018-03-31 DIAGNOSIS — R51 Headache: Secondary | ICD-10-CM | POA: Diagnosis not present

## 2018-03-31 DIAGNOSIS — Z981 Arthrodesis status: Secondary | ICD-10-CM | POA: Insufficient documentation

## 2018-03-31 DIAGNOSIS — J45909 Unspecified asthma, uncomplicated: Secondary | ICD-10-CM | POA: Diagnosis not present

## 2018-03-31 DIAGNOSIS — Z87891 Personal history of nicotine dependence: Secondary | ICD-10-CM | POA: Diagnosis not present

## 2018-03-31 DIAGNOSIS — Z9012 Acquired absence of left breast and nipple: Secondary | ICD-10-CM | POA: Diagnosis not present

## 2018-03-31 DIAGNOSIS — G4733 Obstructive sleep apnea (adult) (pediatric): Secondary | ICD-10-CM | POA: Diagnosis not present

## 2018-03-31 DIAGNOSIS — Z9010 Acquired absence of unspecified breast and nipple: Secondary | ICD-10-CM

## 2018-03-31 HISTORY — PX: REMOVAL OF TISSUE EXPANDER AND PLACEMENT OF IMPLANT: SHX6457

## 2018-03-31 SURGERY — REMOVAL, TISSUE EXPANDER, BREAST, WITH IMPLANT INSERTION
Anesthesia: General | Site: Breast | Laterality: Left

## 2018-03-31 MED ORDER — SULFAMETHOXAZOLE-TRIMETHOPRIM 800-160 MG PO TABS: 1.0000 | ORAL_TABLET | Freq: Two times a day (BID) | ORAL | 0 refills | Status: AC

## 2018-03-31 MED ORDER — OXYCODONE-ACETAMINOPHEN 5-325 MG PO TABS: 1.0000 | ORAL_TABLET | ORAL | 0 refills | Status: AC | PRN

## 2018-03-31 MED ORDER — HYDROMORPHONE HCL 1 MG/ML IJ SOLN: 0.5000 mg | INTRAMUSCULAR | Status: DC | PRN

## 2018-03-31 MED ORDER — ACETAMINOPHEN 500 MG PO TABS
ORAL_TABLET | ORAL | Status: AC
  Filled 2018-03-31: qty 2

## 2018-03-31 MED ORDER — CEFAZOLIN SODIUM-DEXTROSE 2-4 GM/100ML-% IV SOLN
INTRAVENOUS | Status: AC
  Filled 2018-03-31: qty 100

## 2018-03-31 MED ORDER — CELECOXIB 200 MG PO CAPS
ORAL_CAPSULE | ORAL | Status: AC
  Filled 2018-03-31: qty 1

## 2018-03-31 MED ORDER — LIDOCAINE HCL (CARDIAC) PF 100 MG/5ML IV SOSY
PREFILLED_SYRINGE | INTRAVENOUS | Status: DC | PRN
  Administered 2018-03-31: 30 mg via INTRAVENOUS

## 2018-03-31 MED ORDER — ACETAMINOPHEN 325 MG PO TABS: 325.0000 mg | ORAL_TABLET | ORAL | Status: DC | PRN

## 2018-03-31 MED ORDER — CHLORHEXIDINE GLUCONATE CLOTH 2 % EX PADS: 6.0000 | MEDICATED_PAD | Freq: Once | CUTANEOUS | Status: DC

## 2018-03-31 MED ORDER — ONDANSETRON HCL 4 MG/2ML IJ SOLN
INTRAMUSCULAR | Status: AC
  Filled 2018-03-31: qty 2

## 2018-03-31 MED ORDER — FENTANYL CITRATE (PF) 100 MCG/2ML IJ SOLN
INTRAMUSCULAR | Status: DC | PRN
  Administered 2018-03-31: 100 ug via INTRAVENOUS

## 2018-03-31 MED ORDER — OXYCODONE-ACETAMINOPHEN 5-325 MG PO TABS
1.0000 | ORAL_TABLET | ORAL | Status: DC | PRN
  Administered 2018-03-31 – 2018-04-01 (×3): 2 via ORAL
  Filled 2018-03-31 (×3): qty 2

## 2018-03-31 MED ORDER — DEXAMETHASONE SODIUM PHOSPHATE 4 MG/ML IJ SOLN
INTRAMUSCULAR | Status: DC | PRN
  Administered 2018-03-31: 10 mg via INTRAVENOUS

## 2018-03-31 MED ORDER — MEPERIDINE HCL 25 MG/ML IJ SOLN: 6.2500 mg | INTRAMUSCULAR | Status: DC | PRN

## 2018-03-31 MED ORDER — METHOCARBAMOL 500 MG PO TABS: 500.0000 mg | ORAL_TABLET | Freq: Three times a day (TID) | ORAL | Status: DC | PRN

## 2018-03-31 MED ORDER — KCL IN DEXTROSE-NACL 20-5-0.45 MEQ/L-%-% IV SOLN
INTRAVENOUS | Status: DC
  Administered 2018-03-31: 10:00:00 via INTRAVENOUS
  Filled 2018-03-31: qty 1000

## 2018-03-31 MED ORDER — CEFAZOLIN SODIUM-DEXTROSE 1-4 GM/50ML-% IV SOLN
1.0000 g | Freq: Three times a day (TID) | INTRAVENOUS | Status: AC
  Administered 2018-03-31 – 2018-04-01 (×3): 1 g via INTRAVENOUS
  Filled 2018-03-31 (×3): qty 50

## 2018-03-31 MED ORDER — LACTATED RINGERS IV SOLN
INTRAVENOUS | Status: DC
  Administered 2018-03-31 (×2): via INTRAVENOUS

## 2018-03-31 MED ORDER — GABAPENTIN 300 MG PO CAPS
300.0000 mg | ORAL_CAPSULE | ORAL | Status: AC
  Administered 2018-03-31: 300 mg via ORAL

## 2018-03-31 MED ORDER — ROCURONIUM BROMIDE 100 MG/10ML IV SOLN
INTRAVENOUS | Status: DC | PRN
  Administered 2018-03-31: 30 mg via INTRAVENOUS

## 2018-03-31 MED ORDER — MIDAZOLAM HCL 2 MG/2ML IJ SOLN: 1.0000 mg | INTRAMUSCULAR | Status: DC | PRN

## 2018-03-31 MED ORDER — PROPOFOL 10 MG/ML IV BOLUS
INTRAVENOUS | Status: AC
  Filled 2018-03-31: qty 20

## 2018-03-31 MED ORDER — KETOROLAC TROMETHAMINE 30 MG/ML IJ SOLN
30.0000 mg | Freq: Three times a day (TID) | INTRAMUSCULAR | Status: AC
  Administered 2018-03-31 – 2018-04-01 (×3): 30 mg via INTRAVENOUS
  Filled 2018-03-31 (×3): qty 1

## 2018-03-31 MED ORDER — BUPIVACAINE-EPINEPHRINE 0.25% -1:200000 IJ SOLN
INTRAMUSCULAR | Status: DC | PRN
  Administered 2018-03-31: 20 mL

## 2018-03-31 MED ORDER — SUGAMMADEX SODIUM 200 MG/2ML IV SOLN
INTRAVENOUS | Status: AC
  Filled 2018-03-31: qty 2

## 2018-03-31 MED ORDER — FENTANYL CITRATE (PF) 100 MCG/2ML IJ SOLN: 50.0000 ug | INTRAMUSCULAR | Status: DC | PRN

## 2018-03-31 MED ORDER — SODIUM CHLORIDE 0.9 % IV SOLN
INTRAVENOUS | Status: DC | PRN
  Administered 2018-03-31: 1000 mL

## 2018-03-31 MED ORDER — CELECOXIB 200 MG PO CAPS
200.0000 mg | ORAL_CAPSULE | ORAL | Status: AC
  Administered 2018-03-31: 200 mg via ORAL

## 2018-03-31 MED ORDER — BUPIVACAINE-EPINEPHRINE (PF) 0.25% -1:200000 IJ SOLN
INTRAMUSCULAR | Status: AC
  Filled 2018-03-31: qty 30

## 2018-03-31 MED ORDER — ONDANSETRON HCL 4 MG/2ML IJ SOLN: 4.0000 mg | Freq: Four times a day (QID) | INTRAMUSCULAR | Status: DC | PRN

## 2018-03-31 MED ORDER — TRAZODONE HCL 100 MG PO TABS
100.0000 mg | ORAL_TABLET | Freq: Every day | ORAL | Status: DC
  Administered 2018-03-31: 100 mg via ORAL

## 2018-03-31 MED ORDER — ONDANSETRON 4 MG PO TBDP: 4.0000 mg | ORAL_TABLET | Freq: Four times a day (QID) | ORAL | Status: DC | PRN

## 2018-03-31 MED ORDER — TRAZODONE HCL 100 MG PO TABS
100.0000 mg | ORAL_TABLET | Freq: Once | ORAL | Status: DC
  Filled 2018-03-31: qty 1

## 2018-03-31 MED ORDER — CEFAZOLIN SODIUM-DEXTROSE 2-4 GM/100ML-% IV SOLN
2.0000 g | INTRAVENOUS | Status: AC
  Administered 2018-03-31 (×2): 2 g via INTRAVENOUS

## 2018-03-31 MED ORDER — ONDANSETRON HCL 4 MG/2ML IJ SOLN
INTRAMUSCULAR | Status: DC | PRN
  Administered 2018-03-31: 4 mg via INTRAVENOUS

## 2018-03-31 MED ORDER — ONDANSETRON HCL 4 MG/2ML IJ SOLN: 4.0000 mg | Freq: Once | INTRAMUSCULAR | Status: DC | PRN

## 2018-03-31 MED ORDER — METHOCARBAMOL 500 MG PO TABS: 500.0000 mg | ORAL_TABLET | Freq: Three times a day (TID) | ORAL | 0 refills | Status: AC | PRN

## 2018-03-31 MED ORDER — OXYCODONE HCL 5 MG/5ML PO SOLN: 5.0000 mg | Freq: Once | ORAL | Status: DC | PRN

## 2018-03-31 MED ORDER — ACETAMINOPHEN 160 MG/5ML PO SOLN: 325.0000 mg | ORAL | Status: DC | PRN

## 2018-03-31 MED ORDER — FENTANYL CITRATE (PF) 100 MCG/2ML IJ SOLN
INTRAMUSCULAR | Status: AC
  Filled 2018-03-31: qty 2

## 2018-03-31 MED ORDER — MIDAZOLAM HCL 5 MG/5ML IJ SOLN
INTRAMUSCULAR | Status: DC | PRN
  Administered 2018-03-31: 2 mg via INTRAVENOUS

## 2018-03-31 MED ORDER — PHENYLEPHRINE HCL 10 MG/ML IJ SOLN
INTRAMUSCULAR | Status: DC | PRN
  Administered 2018-03-31 (×7): 80 ug via INTRAVENOUS

## 2018-03-31 MED ORDER — SUGAMMADEX SODIUM 500 MG/5ML IV SOLN
INTRAVENOUS | Status: DC | PRN
  Administered 2018-03-31: 200 mg via INTRAVENOUS

## 2018-03-31 MED ORDER — SCOPOLAMINE 1 MG/3DAYS TD PT72: 1.0000 | MEDICATED_PATCH | Freq: Once | TRANSDERMAL | Status: DC | PRN

## 2018-03-31 MED ORDER — LIDOCAINE HCL (CARDIAC) PF 100 MG/5ML IV SOSY
PREFILLED_SYRINGE | INTRAVENOUS | Status: AC
  Filled 2018-03-31: qty 5

## 2018-03-31 MED ORDER — MIDAZOLAM HCL 2 MG/2ML IJ SOLN
INTRAMUSCULAR | Status: AC
  Filled 2018-03-31: qty 2

## 2018-03-31 MED ORDER — GABAPENTIN 300 MG PO CAPS
ORAL_CAPSULE | ORAL | Status: AC
  Filled 2018-03-31: qty 1

## 2018-03-31 MED ORDER — ROCURONIUM BROMIDE 50 MG/5ML IV SOLN
INTRAVENOUS | Status: AC
  Filled 2018-03-31: qty 1

## 2018-03-31 MED ORDER — OXYCODONE HCL 5 MG PO TABS: 5.0000 mg | ORAL_TABLET | Freq: Once | ORAL | Status: DC | PRN

## 2018-03-31 MED ORDER — PROPOFOL 10 MG/ML IV BOLUS
INTRAVENOUS | Status: DC | PRN
  Administered 2018-03-31: 150 mg via INTRAVENOUS

## 2018-03-31 MED ORDER — ACETAMINOPHEN 500 MG PO TABS
1000.0000 mg | ORAL_TABLET | ORAL | Status: AC
  Administered 2018-03-31: 1000 mg via ORAL

## 2018-03-31 MED ORDER — FENTANYL CITRATE (PF) 100 MCG/2ML IJ SOLN: 25.0000 ug | INTRAMUSCULAR | Status: DC | PRN

## 2018-03-31 MED ORDER — DEXAMETHASONE SODIUM PHOSPHATE 10 MG/ML IJ SOLN
INTRAMUSCULAR | Status: AC
  Filled 2018-03-31: qty 1

## 2018-03-31 SURGICAL SUPPLY — 54 items
ADH SKN CLS APL DERMABOND .7 (GAUZE/BANDAGES/DRESSINGS) ×2
BAG DECANTER FOR FLEXI CONT (MISCELLANEOUS) ×3 IMPLANT
BINDER BREAST LRG (GAUZE/BANDAGES/DRESSINGS) ×2 IMPLANT
BINDER BREAST MEDIUM (GAUZE/BANDAGES/DRESSINGS) IMPLANT
BLADE SURG 10 STRL SS (BLADE) ×3 IMPLANT
BNDG GAUZE ELAST 4 BULKY (GAUZE/BANDAGES/DRESSINGS) ×6 IMPLANT
CANISTER SUCT 1200ML W/VALVE (MISCELLANEOUS) ×3 IMPLANT
CHLORAPREP W/TINT 26ML (MISCELLANEOUS) ×5 IMPLANT
COVER BACK TABLE 60X90IN (DRAPES) ×3 IMPLANT
COVER MAYO STAND STRL (DRAPES) ×3 IMPLANT
DERMABOND ADVANCED (GAUZE/BANDAGES/DRESSINGS) ×4
DERMABOND ADVANCED .7 DNX12 (GAUZE/BANDAGES/DRESSINGS) ×2 IMPLANT
DRAPE TOP ARMCOVERS (MISCELLANEOUS) ×3 IMPLANT
DRAPE U-SHAPE 76X120 STRL (DRAPES) ×3 IMPLANT
DRSG PAD ABDOMINAL 8X10 ST (GAUZE/BANDAGES/DRESSINGS) ×4 IMPLANT
ELECT BLADE 4.0 EZ CLEAN MEGAD (MISCELLANEOUS) ×3
ELECT COATED BLADE 2.86 ST (ELECTRODE) ×3 IMPLANT
ELECT REM PT RETURN 9FT ADLT (ELECTROSURGICAL) ×3
ELECTRODE BLDE 4.0 EZ CLN MEGD (MISCELLANEOUS) ×1 IMPLANT
ELECTRODE REM PT RTRN 9FT ADLT (ELECTROSURGICAL) ×1 IMPLANT
GLOVE BIO SURGEON STRL SZ 6 (GLOVE) ×6 IMPLANT
GLOVE BIOGEL PI IND STRL 7.0 (GLOVE) IMPLANT
GLOVE BIOGEL PI INDICATOR 7.0 (GLOVE) ×4
GLOVE SURG SS PI 6.5 STRL IVOR (GLOVE) ×2 IMPLANT
GOWN STRL REUS W/ TWL LRG LVL3 (GOWN DISPOSABLE) ×2 IMPLANT
GOWN STRL REUS W/TWL LRG LVL3 (GOWN DISPOSABLE) ×6
IMPL BREAST P5.6X11.25X 375 (Breast) IMPLANT
IMPL BRST P5.6X11.25X 375CC (Breast) ×1 IMPLANT
IMPLANT BREAST GEL 375CC (Breast) ×3 IMPLANT
NDL HYPO 25X1 1.5 SAFETY (NEEDLE) IMPLANT
NEEDLE HYPO 25X1 1.5 SAFETY (NEEDLE) ×3 IMPLANT
PACK BASIN DAY SURGERY FS (CUSTOM PROCEDURE TRAY) ×3 IMPLANT
PENCIL BUTTON HOLSTER BLD 10FT (ELECTRODE) ×3 IMPLANT
SHEET MEDIUM DRAPE 40X70 STRL (DRAPES) ×4 IMPLANT
SIZER BREAST REUSE 375CC (SIZER) ×3
SIZER BRST REUSE 375CC (SIZER) IMPLANT
SLEEVE SCD COMPRESS KNEE MED (MISCELLANEOUS) ×3 IMPLANT
SPONGE LAP 18X18 RF (DISPOSABLE) ×6 IMPLANT
STAPLER VISISTAT 35W (STAPLE) ×3 IMPLANT
SUT MNCRL AB 4-0 PS2 18 (SUTURE) ×3 IMPLANT
SUT PDS AB 2-0 CT2 27 (SUTURE) ×2 IMPLANT
SUT SILK 2 0 SH (SUTURE) IMPLANT
SUT VIC AB 3-0 PS1 18 (SUTURE)
SUT VIC AB 3-0 PS1 18XBRD (SUTURE) IMPLANT
SUT VIC AB 3-0 SH 27 (SUTURE) ×3
SUT VIC AB 3-0 SH 27X BRD (SUTURE) ×1 IMPLANT
SUT VICRYL 4-0 PS2 18IN ABS (SUTURE) ×3 IMPLANT
SYR BULB IRRIGATION 50ML (SYRINGE) ×6 IMPLANT
SYR CONTROL 10ML LL (SYRINGE) ×2 IMPLANT
TOWEL OR 17X24 6PK STRL BLUE (TOWEL DISPOSABLE) ×6 IMPLANT
TUBE CONNECTING 20'X1/4 (TUBING) ×2
TUBE CONNECTING 20X1/4 (TUBING) ×4 IMPLANT
UNDERPAD 30X30 (UNDERPADS AND DIAPERS) ×6 IMPLANT
YANKAUER SUCT BULB TIP NO VENT (SUCTIONS) ×3 IMPLANT

## 2018-03-31 NOTE — Anesthesia Preprocedure Evaluation (Addendum)
Anesthesia Evaluation  Patient identified by MRN, date of birth, ID band Patient awake    Reviewed: Allergy & Precautions, H&P , NPO status , Patient's Chart, lab work & pertinent test results  History of Anesthesia Complications (+) PONV and history of anesthetic complications  Airway Mallampati: II  TM Distance: >3 FB Neck ROM: full    Dental  (+) Teeth Intact   Pulmonary shortness of breath, asthma , sleep apnea , Current Smoker,    breath sounds clear to auscultation       Cardiovascular negative cardio ROS   Rhythm:regular Rate:Normal     Neuro/Psych  Headaches, PSYCHIATRIC DISORDERS Anxiety Depression    GI/Hepatic negative GI ROS, Neg liver ROS,   Endo/Other  negative endocrine ROS  Renal/GU negative Renal ROS     Musculoskeletal  (+) Arthritis ,   Abdominal   Peds  Hematology negative hematology ROS (+)   Anesthesia Other Findings   Reproductive/Obstetrics                             Anesthesia Physical  Anesthesia Plan  ASA: II  Anesthesia Plan: General   Post-op Pain Management:    Induction: Intravenous  PONV Risk Score and Plan: 3 and Ondansetron, Dexamethasone and Propofol infusion  Airway Management Planned: LMA and Oral ETT  Additional Equipment: None  Intra-op Plan:   Post-operative Plan: Extubation in OR  Informed Consent: I have reviewed the patients History and Physical, chart, labs and discussed the procedure including the risks, benefits and alternatives for the proposed anesthesia with the patient or authorized representative who has indicated his/her understanding and acceptance.   Dental advisory given  Plan Discussed with: CRNA and Surgeon  Anesthesia Plan Comments:        Anesthesia Quick Evaluation

## 2018-03-31 NOTE — Op Note (Signed)
Operative Note   DATE OF OPERATION: 5.21.19  LOCATION: [facility]-observatin  SURGICAL DIVISION: Plastic Surgery  PREOPERATIVE DIAGNOSES:  1. Acquired absence breasts  POSTOPERATIVE DIAGNOSES:  same  PROCEDURE:  Removal left chest tissue expander and placement silicone implant  SURGEON: Irene Limbo MD MBA  ASSISTANT: none  ANESTHESIA:  General.   EBL: 10 ml  COMPLICATIONS: None immediate.   INDICATIONS FOR PROCEDURE:  The patient, [name], is a 42 y.o. female born on [birth year], is here for placement implant left chest following latissimus dorsi flap. Patient has history bilateral nipple sparing mastectomies and immediate expander based reconstruction. Course complicated by rippling and underwent revision surgery. She developed skin flap necrosis following this and most recently underwent latissimus flap with expander placement.   FINDINGS: Natrelle Smooth Round Cohesive Extra Projection 375 ml implant placed, REF [id number]  DESCRIPTION OF PROCEDURE:  The patient's operative site was marked with the patient in the preoperative area. The patient was taken to the operating room. SCDs were placed and IV antibiotics were given. The patient's operative site was prepped and draped in a sterile fashion. A time out was performed and all information was confirmed to be correct. Incision made in prior inframammary fold scar and carried to latissimus muscle. Muscle divided in direction fibers and expander removed. Capsulotomies performed medially, inferiorly and superiorly. Sizer placed. Patient brought to upright sitting position and assessed for symmetry. Extra projection 375 implant selected. Patient returned to supine position. Cavity irrigated with saline solution containing Ancef, gentamicin and bacitracin. Hemostasis ensured. The superficial fascia from anterior mastectomy flap at desired inframammary fold sutured to chest wall with interrupted 2-0 PDS to set  IMF. Cavity irrigated with Betadine. Implant placed and orientation implant ensured. Closure completed with 3-0 vicryl for closure latissimus muscle, 4-0 vicryl in dermis and 4-0 monocryl subcuticular for skin closure. Dermabond applied followed by dry dressing and breast binder.  The patient was allowed to wake from anesthesia, extubated and taken to the recovery room in satisfactory condition.   SPECIMENS: none  DRAINS: none  Irene Limbo, MD [facility]astic [facility] [phone], pin [phone]

## 2018-03-31 NOTE — Anesthesia Procedure Notes (Signed)
Procedure Name: Intubation Date/Time: 03/31/2018 7:28 AM Performed by: Marrianne Mood, CRNA Pre-anesthesia Checklist: Patient identified, Emergency Drugs available, Suction available, Patient being monitored and Timeout performed Patient Re-evaluated:Patient Re-evaluated prior to induction Oxygen Delivery Method: Circle system utilized Preoxygenation: Pre-oxygenation with 100% oxygen Induction Type: IV induction Ventilation: Mask ventilation without difficulty Laryngoscope Size: Mac and 3 Grade View: Grade IV Tube type: Oral Number of attempts: 1 Airway Equipment and Method: Stylet and Oral airway Placement Confirmation: ETT inserted through vocal cords under direct vision,  positive ETCO2 and breath sounds checked- equal and bilateral Tube secured with: Tape Dental Injury: Teeth and Oropharynx as per pre-operative assessment  Difficulty Due To: Difficult Airway- due to limited oral opening, Difficult Airway- due to dentition and Difficult Airway- due to reduced neck mobility

## 2018-03-31 NOTE — Discharge Instructions (Signed)

## 2018-03-31 NOTE — Interval H&P Note (Signed)
History and Physical Interval Note:  03/31/2018 6:33 AM  [name]  has presented today for surgery, with the diagnosis of ACQUIRED ABSENCE OF LEFT BREAST  The various methods of treatment have been discussed with the patient and family. After consideration of risks, benefits and other options for treatment, the patient has consented to  Procedure(s): REMOVAL OF LEFT TISSUE EXPANDER AND PLACEMENT OF SILICONE IMPLANT (Left) as a surgical intervention .  The patient's history has been reviewed, patient examined, no change in status, stable for surgery.  I have reviewed the patient's chart and labs.  Questions were answered to the patient's satisfaction.     [name]

## 2018-03-31 NOTE — Transfer of Care (Signed)
Immediate Anesthesia Transfer of Care Note  Patient: [name]  Procedure(s) Performed: REMOVAL OF LEFT TISSUE EXPANDER AND PLACEMENT OF SILICONE IMPLANT (Left Breast)  Patient Location: PACU  Anesthesia Type:General  Level of Consciousness: awake and patient cooperative  Airway & Oxygen Therapy: Patient Spontanous Breathing and Patient connected to face mask oxygen  Post-op Assessment: Report given to RN and Post -op Vital signs reviewed and stable  Post vital signs: Reviewed and stable  Last Vitals:  Vitals Value Taken Time  BP    Temp    Pulse    Resp    SpO2      Last Pain:  Vitals:   03/31/18 0631  TempSrc: Oral  PainSc: 0-No pain         Complications: No apparent anesthesia complications

## 2018-03-31 NOTE — Anesthesia Postprocedure Evaluation (Signed)
Anesthesia Post Note  Patient: PAW KARSTENS  Procedure(s) Performed: REMOVAL OF LEFT TISSUE EXPANDER AND PLACEMENT OF SILICONE IMPLANT (Left Breast)     Patient location during evaluation: PACU Anesthesia Type: General Level of consciousness: awake and alert Pain management: pain level controlled Vital Signs Assessment: post-procedure vital signs reviewed and stable Respiratory status: spontaneous breathing, nonlabored ventilation, respiratory function stable and patient connected to nasal cannula oxygen Cardiovascular status: blood pressure returned to baseline and stable Postop Assessment: no apparent nausea or vomiting Anesthetic complications: no    Last Vitals:  Vitals:   03/31/18 1300 03/31/18 1400  BP:  125/69  Pulse: 77 94  Resp:  16  Temp:  36.7 C  SpO2: 97% 100%    Last Pain:  Vitals:   03/31/18 1400  TempSrc:   PainSc: 2                  Tiajuana Amass

## 2018-04-01 DIAGNOSIS — M1990 Unspecified osteoarthritis, unspecified site: Secondary | ICD-10-CM | POA: Diagnosis not present

## 2018-04-01 DIAGNOSIS — G4730 Sleep apnea, unspecified: Secondary | ICD-10-CM | POA: Diagnosis not present

## 2018-04-01 DIAGNOSIS — R51 Headache: Secondary | ICD-10-CM | POA: Diagnosis not present

## 2018-04-01 DIAGNOSIS — Z803 Family history of malignant neoplasm of breast: Secondary | ICD-10-CM | POA: Diagnosis not present

## 2018-04-01 DIAGNOSIS — Z9013 Acquired absence of bilateral breasts and nipples: Secondary | ICD-10-CM | POA: Diagnosis not present

## 2018-04-01 DIAGNOSIS — J45909 Unspecified asthma, uncomplicated: Secondary | ICD-10-CM | POA: Diagnosis not present

## 2018-04-01 DIAGNOSIS — Z421 Encounter for breast reconstruction following mastectomy: Secondary | ICD-10-CM | POA: Diagnosis not present

## 2018-04-01 DIAGNOSIS — Z981 Arthrodesis status: Secondary | ICD-10-CM | POA: Diagnosis not present

## 2018-04-01 DIAGNOSIS — Z87891 Personal history of nicotine dependence: Secondary | ICD-10-CM | POA: Diagnosis not present

## 2018-04-02 ENCOUNTER — Encounter (HOSPITAL_BASED_OUTPATIENT_CLINIC_OR_DEPARTMENT_OTHER): Payer: Self-pay | Admitting: Plastic Surgery

## 2021-09-05 DIAGNOSIS — G4700 Insomnia, unspecified: Secondary | ICD-10-CM | POA: Diagnosis not present

## 2021-09-05 DIAGNOSIS — F32A Depression, unspecified: Secondary | ICD-10-CM | POA: Diagnosis not present

## 2021-09-05 DIAGNOSIS — E039 Hypothyroidism, unspecified: Secondary | ICD-10-CM | POA: Diagnosis not present

## 2021-09-05 DIAGNOSIS — E28310 Symptomatic premature menopause: Secondary | ICD-10-CM | POA: Diagnosis not present

## 2021-09-05 DIAGNOSIS — F6089 Other specific personality disorders: Secondary | ICD-10-CM | POA: Diagnosis not present

## 2021-09-05 DIAGNOSIS — F4312 Post-traumatic stress disorder, chronic: Secondary | ICD-10-CM | POA: Diagnosis not present

## 2021-09-05 DIAGNOSIS — E559 Vitamin D deficiency, unspecified: Secondary | ICD-10-CM | POA: Diagnosis not present

## 2021-09-19 DIAGNOSIS — F32A Depression, unspecified: Secondary | ICD-10-CM | POA: Diagnosis not present

## 2021-09-19 DIAGNOSIS — E28310 Symptomatic premature menopause: Secondary | ICD-10-CM | POA: Diagnosis not present

## 2021-09-19 DIAGNOSIS — Z23 Encounter for immunization: Secondary | ICD-10-CM | POA: Diagnosis not present

## 2021-10-03 DIAGNOSIS — I10 Essential (primary) hypertension: Secondary | ICD-10-CM | POA: Diagnosis not present

## 2021-10-03 DIAGNOSIS — Z23 Encounter for immunization: Secondary | ICD-10-CM | POA: Diagnosis not present

## 2021-10-03 DIAGNOSIS — F6089 Other specific personality disorders: Secondary | ICD-10-CM | POA: Diagnosis not present

## 2021-12-12 DIAGNOSIS — Z7251 High risk heterosexual behavior: Secondary | ICD-10-CM | POA: Diagnosis not present

## 2021-12-12 DIAGNOSIS — G4730 Sleep apnea, unspecified: Secondary | ICD-10-CM | POA: Diagnosis not present

## 2021-12-12 DIAGNOSIS — I10 Essential (primary) hypertension: Secondary | ICD-10-CM | POA: Diagnosis not present

## 2021-12-12 DIAGNOSIS — G4700 Insomnia, unspecified: Secondary | ICD-10-CM | POA: Diagnosis not present

## 2021-12-12 DIAGNOSIS — E785 Hyperlipidemia, unspecified: Secondary | ICD-10-CM | POA: Diagnosis not present

## 2021-12-12 DIAGNOSIS — F32A Depression, unspecified: Secondary | ICD-10-CM | POA: Diagnosis not present

## 2021-12-30 DIAGNOSIS — Z20822 Contact with and (suspected) exposure to covid-19: Secondary | ICD-10-CM | POA: Diagnosis not present

## 2022-01-06 DIAGNOSIS — Z20822 Contact with and (suspected) exposure to covid-19: Secondary | ICD-10-CM | POA: Diagnosis not present

## 2022-01-09 DIAGNOSIS — I10 Essential (primary) hypertension: Secondary | ICD-10-CM | POA: Diagnosis not present

## 2022-01-09 DIAGNOSIS — F32A Depression, unspecified: Secondary | ICD-10-CM | POA: Diagnosis not present

## 2022-02-25 DIAGNOSIS — Z20822 Contact with and (suspected) exposure to covid-19: Secondary | ICD-10-CM | POA: Diagnosis not present

## 2022-03-11 DIAGNOSIS — Z6827 Body mass index (BMI) 27.0-27.9, adult: Secondary | ICD-10-CM | POA: Diagnosis not present

## 2022-03-11 DIAGNOSIS — F6089 Other specific personality disorders: Secondary | ICD-10-CM | POA: Diagnosis not present

## 2022-03-11 DIAGNOSIS — R635 Abnormal weight gain: Secondary | ICD-10-CM | POA: Diagnosis not present

## 2022-03-11 DIAGNOSIS — E785 Hyperlipidemia, unspecified: Secondary | ICD-10-CM | POA: Diagnosis not present

## 2022-03-11 DIAGNOSIS — I10 Essential (primary) hypertension: Secondary | ICD-10-CM | POA: Diagnosis not present

## 2022-04-01 DIAGNOSIS — F32A Depression, unspecified: Secondary | ICD-10-CM | POA: Diagnosis not present

## 2022-04-01 DIAGNOSIS — E039 Hypothyroidism, unspecified: Secondary | ICD-10-CM | POA: Diagnosis not present

## 2022-04-01 DIAGNOSIS — E785 Hyperlipidemia, unspecified: Secondary | ICD-10-CM | POA: Diagnosis not present

## 2022-04-01 DIAGNOSIS — I10 Essential (primary) hypertension: Secondary | ICD-10-CM | POA: Diagnosis not present

## 2022-04-01 DIAGNOSIS — E441 Mild protein-calorie malnutrition: Secondary | ICD-10-CM | POA: Diagnosis not present

## 2022-05-13 DIAGNOSIS — I10 Essential (primary) hypertension: Secondary | ICD-10-CM | POA: Diagnosis not present

## 2022-05-27 DIAGNOSIS — Z20822 Contact with and (suspected) exposure to covid-19: Secondary | ICD-10-CM | POA: Diagnosis not present

## 2022-07-09 ENCOUNTER — Encounter (HOSPITAL_COMMUNITY): Payer: Self-pay

## 2022-07-09 ENCOUNTER — Other Ambulatory Visit: Payer: Self-pay

## 2022-07-09 ENCOUNTER — Emergency Department (HOSPITAL_COMMUNITY)
Admission: EM | Admit: 2022-07-09 | Discharge: 2022-07-09 | Disposition: A | Discharge status: Home / Self Care | Payer: Medicare Other | Attending: Emergency Medicine | Admitting: Emergency Medicine

## 2022-07-09 DIAGNOSIS — R509 Fever, unspecified: Secondary | ICD-10-CM | POA: Diagnosis present

## 2022-07-09 DIAGNOSIS — R110 Nausea: Secondary | ICD-10-CM

## 2022-07-09 DIAGNOSIS — U071 COVID-19: Secondary | ICD-10-CM | POA: Diagnosis not present

## 2022-07-09 LAB — SARS CORONAVIRUS 2 BY RT PCR: SARS Coronavirus 2 by RT PCR: POSITIVE — AB

## 2022-07-09 MED ORDER — MOLNUPIRAVIR EUA 200MG CAPSULE: 4.0000 | ORAL_CAPSULE | Freq: Two times a day (BID) | ORAL | 0 refills | Status: AC

## 2022-07-09 MED ORDER — ONDANSETRON HCL 4 MG PO TABS: 4.0000 mg | ORAL_TABLET | Freq: Three times a day (TID) | ORAL | 0 refills | Status: AC | PRN

## 2022-07-09 NOTE — Discharge Instructions (Addendum)
You were diagnosed today with COVID-19. Please take the medication as prescribed. Follow up with your primary care provider as needed. You may take over the counter medication as needed for pain and fever control. Please hydrate as you are able.

## 2022-07-09 NOTE — ED Triage Notes (Signed)
Pt reports with right ear pain, nausea, generalized body aches, and a sore throat since yesterday. Pt states that she had a fever of 100.2 f at home. Pt took Motrin.

## 2022-07-09 NOTE — ED Provider Notes (Signed)
[facility] DEPT Provider Note   CSN: [id number] Arrival date & time: 07/09/22  1947     History  Chief Complaint  Patient presents with   Ear Pain   Nausea   Generalized Body Aches   Sore Throat    [name] is a 46 y.o. female.  Patient presents to the hospital complaining of body aches, fever, nausea, sore throat, bilateral ear pain.  Patient states her symptoms started yesterday morning.  She states she knows of no sick contacts.  The patient denies chest pain, shortness of breath, headache, abdominal pain, vomiting.  Past medical history significant for chronic bronchitis, history of bilateral breast cancer, asthma, arthritis, sleep apnea, history of anemia, depression, anxiety  HPI     Home Medications Prior to Admission medications   Medication Sig Start Date End Date Taking? Authorizing Provider  molnupiravir EUA (LAGEVRIO) 200 mg CAPS capsule Take 4 capsules (800 mg total) by mouth 2 (two) times daily for 5 days. 07/09/22 07/14/22 Yes Dorothyann Peng, PA-C  ondansetron (ZOFRAN) 4 MG tablet Take 1 tablet (4 mg total) by mouth every 8 (eight) hours as needed for nausea or vomiting. 07/09/22  Yes Dorothyann Peng, PA-C  ALPRAZolam Duanne Moron) 0.5 MG tablet Take 0.5 mg by mouth 2 (two) times daily. 06/13/22   [provider]  Ascorbic Acid (VITAMIN C PO) Take 1 tablet by mouth 3 (three) times daily.    [provider]  diphenhydrAMINE (BENADRYL) 25 MG tablet Take 50 mg by mouth daily as needed.    [provider]  doxylamine, Sleep, (UNISOM) 25 MG tablet Take 50 mg by mouth at bedtime as needed for sleep.    [provider]  ferrous sulfate 325 (65 FE) MG EC tablet Take 325 mg by mouth 3 (three) times daily with meals.    [provider]  Imiquimod Pump 3.75 % CREA Apply topically. 07/09/22   [provider]  methocarbamol (ROBAXIN) 500 MG tablet Take 1 tablet (500 mg total) by mouth every 8  (eight) hours as needed for muscle spasms. 03/31/18   Irene Limbo, MD  ondansetron (ZOFRAN) 4 MG tablet Take 4 mg by mouth every 8 (eight) hours as needed for nausea or vomiting.    [provider]  oxyCODONE-acetaminophen (PERCOCET/ROXICET) 5-325 MG tablet Take 1-2 tablets by mouth every 4 (four) hours as needed for moderate pain. 03/31/18   Irene Limbo, MD  sulfamethoxazole-trimethoprim (BACTRIM DS,SEPTRA DS) 800-160 MG tablet Take 1 tablet by mouth 2 (two) times daily. 03/31/18   Irene Limbo, MD  traZODone (DESYREL) 100 MG tablet Take 100 mg by mouth at bedtime.     [provider]      Allergies    Shrimp [shellfish allergy] and Kiwi extract    Review of Systems   Review of Systems  Constitutional:  Positive for fever.  HENT:  Positive for sore throat.   Respiratory:  Positive for cough. Negative for shortness of breath.   Cardiovascular:  Negative for chest pain.  Gastrointestinal:  Positive for nausea. Negative for abdominal pain, constipation, diarrhea and vomiting.    Physical Exam Updated Vital Signs BP 111/83 (BP Location: Right Arm)   Pulse 88   Temp 98.3 F (36.8 C) (Oral)   Resp 18   Ht '5\' 5"'$  (1.651 m)   Wt 70.8 kg   LMP 01/07/2013   SpO2 100%   BMI 25.96 kg/m  Physical Exam Vitals and nursing note reviewed.  Constitutional:  General: She is not in acute distress.    Appearance: She is normal weight.  HENT:     Head: Normocephalic and atraumatic.     Ears:     Comments: Ears with significant cerumen. Unable to visualize TMs    Nose: No congestion.     Mouth/Throat:     Mouth: Mucous membranes are moist.     Pharynx: Uvula midline. Posterior oropharyngeal erythema present. No pharyngeal swelling or oropharyngeal exudate.     Tonsils: No tonsillar exudate or tonsillar abscesses.  Eyes:     Conjunctiva/sclera: Conjunctivae normal.  Cardiovascular:     Rate and Rhythm: Normal rate and regular rhythm.     Heart sounds:  Normal heart sounds.  Pulmonary:     Effort: Pulmonary effort is normal.     Breath sounds: Normal breath sounds.  Abdominal:     Palpations: Abdomen is soft.     Tenderness: There is no abdominal tenderness.  Musculoskeletal:     Cervical back: Normal range of motion and neck supple.  Skin:    General: Skin is warm and dry.     Capillary Refill: Capillary refill takes less than 2 seconds.  Neurological:     Mental Status: She is alert and oriented to person, place, and time.     ED Results / Procedures / Treatments   Labs (all labs ordered are listed, but only abnormal results are displayed) Labs Reviewed  SARS CORONAVIRUS 2 BY RT PCR - Abnormal; Notable for the following components:      Result Value   SARS Coronavirus 2 by RT PCR POSITIVE (*)    All other components within normal limits    EKG None  Radiology No results found.  Procedures Procedures    Medications Ordered in ED Medications - No data to display  ED Course/ Medical Decision Making/ A&P                           Medical Decision Making Amount and/or Complexity of Data Reviewed Labs: ordered.   This patient presents to the ED for concern of cough, fever, body aches, sore throat.  These involve an extensive number of treatment options, and is a complaint that carries with it a high risk of complications and morbidity.  The differential diagnosis includes COVID-19, influenza, strep throat, other viral illnesses, and others   Co morbidities that complicate the patient evaluation  History of asthma, chronic bronchitis, history of bilateral breast cancer   Lab Tests:  I Ordered, and personally interpreted labs.  The pertinent results include: Patient positive for COVID-19   Test / Admission - Considered:  The patient is tested positive for COVID-19.  I discussed Paxlovid with the patient but the patient declined having a BMP due to having to wait for results.  I explained that I was unable to  prescribe this medication without knowing her kidney function.  She tried to find medical records from a primary care visit from 1 week ago but was unable to do so on her phone.  These records were not available in care everywhere. Even though the patient states she has normal kidney function I was unwilling to prescribe paxlovid at this time. Instead of Paxlovid I prescribed molnupiravir which did not require a current GFR. Patient will take the antiviral medication alongside OTC meds for pain and fever control as needed.  Zofran prescription provided for nausea relief.  Patient may follow-up with primary  care as needed.  Quarantine/isolation instructions provided        Final Clinical Impression(s) / ED Diagnoses Final diagnoses:  COVID-19  Nausea    Rx / DC Orders ED Discharge Orders          Ordered    molnupiravir EUA (LAGEVRIO) 200 mg CAPS capsule  2 times daily        07/09/22 2341    ondansetron (ZOFRAN) 4 MG tablet  Every 8 hours PRN        07/09/22 2343              Dorothyann Peng, PA-C 07/09/22 2352    Elgie Congo, MD 07/10/22 [phone]

## 2022-08-23 ENCOUNTER — Other Ambulatory Visit: Payer: Self-pay | Admitting: Family Medicine

## 2022-08-23 DIAGNOSIS — N183 Chronic kidney disease, stage 3 unspecified: Secondary | ICD-10-CM

## 2022-08-28 ENCOUNTER — Ambulatory Visit
Admission: RE | Admit: 2022-08-28 | Discharge: 2022-08-28 | Disposition: A | Discharge status: Home / Self Care | Payer: Medicare Other | Source: Ambulatory Visit | Attending: Family Medicine | Admitting: Family Medicine

## 2022-08-28 DIAGNOSIS — N183 Chronic kidney disease, stage 3 unspecified: Secondary | ICD-10-CM
